# Patient Record
Sex: Male | Born: 1935
Health system: Southern US, Community
[De-identification: ages and names within clinical notes are randomized; demographics above are authoritative.]

## PROBLEM LIST (undated history)

## (undated) DIAGNOSIS — I2510 Atherosclerotic heart disease of native coronary artery without angina pectoris: Secondary | ICD-10-CM

## (undated) DIAGNOSIS — C61 Malignant neoplasm of prostate: Secondary | ICD-10-CM

## (undated) DIAGNOSIS — I219 Acute myocardial infarction, unspecified: Secondary | ICD-10-CM

## (undated) DIAGNOSIS — I951 Orthostatic hypotension: Principal | ICD-10-CM

## (undated) DIAGNOSIS — I639 Cerebral infarction, unspecified: Secondary | ICD-10-CM

## (undated) HISTORY — DX: Cerebral infarction, unspecified: I63.9

## (undated) HISTORY — PX: OTHER SURGICAL HISTORY: SHX169

## (undated) HISTORY — DX: Orthostatic hypotension: I95.1

## (undated) HISTORY — PX: CORONARY ARTERY BYPASS GRAFT: SHX141

## (undated) HISTORY — PX: CARDIAC SURGERY: SHX584

---

## 2014-08-22 DIAGNOSIS — M62838 Other muscle spasm: Secondary | ICD-10-CM | POA: Diagnosis not present

## 2014-08-22 DIAGNOSIS — M533 Sacrococcygeal disorders, not elsewhere classified: Secondary | ICD-10-CM | POA: Diagnosis not present

## 2014-08-31 DIAGNOSIS — E785 Hyperlipidemia, unspecified: Secondary | ICD-10-CM | POA: Diagnosis not present

## 2014-08-31 DIAGNOSIS — C61 Malignant neoplasm of prostate: Secondary | ICD-10-CM | POA: Diagnosis not present

## 2014-08-31 DIAGNOSIS — M5441 Lumbago with sciatica, right side: Secondary | ICD-10-CM | POA: Diagnosis not present

## 2014-09-07 DIAGNOSIS — M545 Low back pain: Secondary | ICD-10-CM | POA: Diagnosis not present

## 2014-09-09 DIAGNOSIS — C61 Malignant neoplasm of prostate: Secondary | ICD-10-CM | POA: Diagnosis not present

## 2014-09-15 DIAGNOSIS — M4806 Spinal stenosis, lumbar region: Secondary | ICD-10-CM | POA: Diagnosis not present

## 2014-09-15 DIAGNOSIS — M5126 Other intervertebral disc displacement, lumbar region: Secondary | ICD-10-CM | POA: Diagnosis not present

## 2014-09-15 DIAGNOSIS — M5136 Other intervertebral disc degeneration, lumbar region: Secondary | ICD-10-CM | POA: Diagnosis not present

## 2014-09-15 DIAGNOSIS — M47816 Spondylosis without myelopathy or radiculopathy, lumbar region: Secondary | ICD-10-CM | POA: Diagnosis not present

## 2014-09-27 DIAGNOSIS — J329 Chronic sinusitis, unspecified: Secondary | ICD-10-CM | POA: Diagnosis not present

## 2014-09-27 DIAGNOSIS — B9689 Other specified bacterial agents as the cause of diseases classified elsewhere: Secondary | ICD-10-CM | POA: Diagnosis not present

## 2014-09-29 DIAGNOSIS — H04123 Dry eye syndrome of bilateral lacrimal glands: Secondary | ICD-10-CM | POA: Diagnosis not present

## 2014-09-29 DIAGNOSIS — Z961 Presence of intraocular lens: Secondary | ICD-10-CM | POA: Diagnosis not present

## 2014-09-29 DIAGNOSIS — H4011X3 Primary open-angle glaucoma, severe stage: Secondary | ICD-10-CM | POA: Diagnosis not present

## 2014-09-29 DIAGNOSIS — H43393 Other vitreous opacities, bilateral: Secondary | ICD-10-CM | POA: Diagnosis not present

## 2014-09-29 DIAGNOSIS — H02052 Trichiasis without entropian right lower eyelid: Secondary | ICD-10-CM | POA: Diagnosis not present

## 2014-10-13 DIAGNOSIS — D2339 Other benign neoplasm of skin of other parts of face: Secondary | ICD-10-CM | POA: Diagnosis not present

## 2014-10-13 DIAGNOSIS — L570 Actinic keratosis: Secondary | ICD-10-CM | POA: Diagnosis not present

## 2014-10-13 DIAGNOSIS — I780 Hereditary hemorrhagic telangiectasia: Secondary | ICD-10-CM | POA: Diagnosis not present

## 2014-10-13 DIAGNOSIS — L818 Other specified disorders of pigmentation: Secondary | ICD-10-CM | POA: Diagnosis not present

## 2014-10-31 DIAGNOSIS — M545 Low back pain: Secondary | ICD-10-CM | POA: Diagnosis not present

## 2014-11-08 DIAGNOSIS — M5413 Radiculopathy, cervicothoracic region: Secondary | ICD-10-CM | POA: Diagnosis not present

## 2014-11-08 DIAGNOSIS — M9901 Segmental and somatic dysfunction of cervical region: Secondary | ICD-10-CM | POA: Diagnosis not present

## 2014-11-08 DIAGNOSIS — M5030 Other cervical disc degeneration, unspecified cervical region: Secondary | ICD-10-CM | POA: Diagnosis not present

## 2014-11-08 DIAGNOSIS — M47813 Spondylosis without myelopathy or radiculopathy, cervicothoracic region: Secondary | ICD-10-CM | POA: Diagnosis not present

## 2014-11-09 DIAGNOSIS — M9901 Segmental and somatic dysfunction of cervical region: Secondary | ICD-10-CM | POA: Diagnosis not present

## 2014-11-09 DIAGNOSIS — M5030 Other cervical disc degeneration, unspecified cervical region: Secondary | ICD-10-CM | POA: Diagnosis not present

## 2014-11-09 DIAGNOSIS — M47813 Spondylosis without myelopathy or radiculopathy, cervicothoracic region: Secondary | ICD-10-CM | POA: Diagnosis not present

## 2014-11-09 DIAGNOSIS — M5413 Radiculopathy, cervicothoracic region: Secondary | ICD-10-CM | POA: Diagnosis not present

## 2014-11-10 DIAGNOSIS — M47813 Spondylosis without myelopathy or radiculopathy, cervicothoracic region: Secondary | ICD-10-CM | POA: Diagnosis not present

## 2014-11-10 DIAGNOSIS — M5030 Other cervical disc degeneration, unspecified cervical region: Secondary | ICD-10-CM | POA: Diagnosis not present

## 2014-11-10 DIAGNOSIS — M5413 Radiculopathy, cervicothoracic region: Secondary | ICD-10-CM | POA: Diagnosis not present

## 2014-11-10 DIAGNOSIS — M9901 Segmental and somatic dysfunction of cervical region: Secondary | ICD-10-CM | POA: Diagnosis not present

## 2014-11-11 DIAGNOSIS — M9901 Segmental and somatic dysfunction of cervical region: Secondary | ICD-10-CM | POA: Diagnosis not present

## 2014-11-11 DIAGNOSIS — M5030 Other cervical disc degeneration, unspecified cervical region: Secondary | ICD-10-CM | POA: Diagnosis not present

## 2014-11-11 DIAGNOSIS — M5413 Radiculopathy, cervicothoracic region: Secondary | ICD-10-CM | POA: Diagnosis not present

## 2014-11-11 DIAGNOSIS — M47813 Spondylosis without myelopathy or radiculopathy, cervicothoracic region: Secondary | ICD-10-CM | POA: Diagnosis not present

## 2014-11-14 DIAGNOSIS — M47813 Spondylosis without myelopathy or radiculopathy, cervicothoracic region: Secondary | ICD-10-CM | POA: Diagnosis not present

## 2014-11-14 DIAGNOSIS — M5030 Other cervical disc degeneration, unspecified cervical region: Secondary | ICD-10-CM | POA: Diagnosis not present

## 2014-11-14 DIAGNOSIS — M9901 Segmental and somatic dysfunction of cervical region: Secondary | ICD-10-CM | POA: Diagnosis not present

## 2014-11-14 DIAGNOSIS — M5413 Radiculopathy, cervicothoracic region: Secondary | ICD-10-CM | POA: Diagnosis not present

## 2014-11-18 DIAGNOSIS — M4806 Spinal stenosis, lumbar region: Secondary | ICD-10-CM | POA: Diagnosis not present

## 2014-11-21 DIAGNOSIS — M5030 Other cervical disc degeneration, unspecified cervical region: Secondary | ICD-10-CM | POA: Diagnosis not present

## 2014-11-21 DIAGNOSIS — E780 Pure hypercholesterolemia: Secondary | ICD-10-CM | POA: Diagnosis not present

## 2014-11-21 DIAGNOSIS — M5413 Radiculopathy, cervicothoracic region: Secondary | ICD-10-CM | POA: Diagnosis not present

## 2014-11-21 DIAGNOSIS — M47813 Spondylosis without myelopathy or radiculopathy, cervicothoracic region: Secondary | ICD-10-CM | POA: Diagnosis not present

## 2014-11-21 DIAGNOSIS — M9901 Segmental and somatic dysfunction of cervical region: Secondary | ICD-10-CM | POA: Diagnosis not present

## 2014-11-22 DIAGNOSIS — M5030 Other cervical disc degeneration, unspecified cervical region: Secondary | ICD-10-CM | POA: Diagnosis not present

## 2014-11-22 DIAGNOSIS — M5413 Radiculopathy, cervicothoracic region: Secondary | ICD-10-CM | POA: Diagnosis not present

## 2014-11-22 DIAGNOSIS — M9901 Segmental and somatic dysfunction of cervical region: Secondary | ICD-10-CM | POA: Diagnosis not present

## 2014-11-22 DIAGNOSIS — M47813 Spondylosis without myelopathy or radiculopathy, cervicothoracic region: Secondary | ICD-10-CM | POA: Diagnosis not present

## 2014-11-23 DIAGNOSIS — M5030 Other cervical disc degeneration, unspecified cervical region: Secondary | ICD-10-CM | POA: Diagnosis not present

## 2014-11-23 DIAGNOSIS — M9901 Segmental and somatic dysfunction of cervical region: Secondary | ICD-10-CM | POA: Diagnosis not present

## 2014-11-23 DIAGNOSIS — M5413 Radiculopathy, cervicothoracic region: Secondary | ICD-10-CM | POA: Diagnosis not present

## 2014-11-23 DIAGNOSIS — M47813 Spondylosis without myelopathy or radiculopathy, cervicothoracic region: Secondary | ICD-10-CM | POA: Diagnosis not present

## 2014-11-24 DIAGNOSIS — M5030 Other cervical disc degeneration, unspecified cervical region: Secondary | ICD-10-CM | POA: Diagnosis not present

## 2014-11-24 DIAGNOSIS — M47813 Spondylosis without myelopathy or radiculopathy, cervicothoracic region: Secondary | ICD-10-CM | POA: Diagnosis not present

## 2014-11-24 DIAGNOSIS — M5413 Radiculopathy, cervicothoracic region: Secondary | ICD-10-CM | POA: Diagnosis not present

## 2014-11-24 DIAGNOSIS — M9901 Segmental and somatic dysfunction of cervical region: Secondary | ICD-10-CM | POA: Diagnosis not present

## 2014-11-28 DIAGNOSIS — I2510 Atherosclerotic heart disease of native coronary artery without angina pectoris: Secondary | ICD-10-CM | POA: Diagnosis not present

## 2014-11-28 DIAGNOSIS — C61 Malignant neoplasm of prostate: Secondary | ICD-10-CM | POA: Diagnosis not present

## 2014-11-28 DIAGNOSIS — E780 Pure hypercholesterolemia: Secondary | ICD-10-CM | POA: Diagnosis not present

## 2014-12-15 DIAGNOSIS — M47812 Spondylosis without myelopathy or radiculopathy, cervical region: Secondary | ICD-10-CM | POA: Diagnosis not present

## 2014-12-15 DIAGNOSIS — M609 Myositis, unspecified: Secondary | ICD-10-CM | POA: Diagnosis not present

## 2014-12-17 DIAGNOSIS — S61210A Laceration without foreign body of right index finger without damage to nail, initial encounter: Secondary | ICD-10-CM | POA: Diagnosis not present

## 2014-12-17 DIAGNOSIS — W1830XA Fall on same level, unspecified, initial encounter: Secondary | ICD-10-CM | POA: Diagnosis not present

## 2014-12-17 DIAGNOSIS — S098XXA Other specified injuries of head, initial encounter: Secondary | ICD-10-CM | POA: Diagnosis not present

## 2014-12-17 DIAGNOSIS — S0990XA Unspecified injury of head, initial encounter: Secondary | ICD-10-CM | POA: Diagnosis not present

## 2014-12-17 DIAGNOSIS — S0101XA Laceration without foreign body of scalp, initial encounter: Secondary | ICD-10-CM | POA: Diagnosis not present

## 2014-12-17 DIAGNOSIS — Z23 Encounter for immunization: Secondary | ICD-10-CM | POA: Diagnosis not present

## 2014-12-19 DIAGNOSIS — M791 Myalgia: Secondary | ICD-10-CM | POA: Diagnosis not present

## 2014-12-19 DIAGNOSIS — M609 Myositis, unspecified: Secondary | ICD-10-CM | POA: Diagnosis not present

## 2014-12-20 DIAGNOSIS — C61 Malignant neoplasm of prostate: Secondary | ICD-10-CM | POA: Diagnosis not present

## 2014-12-20 DIAGNOSIS — I6529 Occlusion and stenosis of unspecified carotid artery: Secondary | ICD-10-CM | POA: Diagnosis not present

## 2014-12-20 DIAGNOSIS — I77819 Aortic ectasia, unspecified site: Secondary | ICD-10-CM | POA: Diagnosis not present

## 2014-12-29 DIAGNOSIS — H4011X3 Primary open-angle glaucoma, severe stage: Secondary | ICD-10-CM | POA: Diagnosis not present

## 2014-12-29 DIAGNOSIS — H04123 Dry eye syndrome of bilateral lacrimal glands: Secondary | ICD-10-CM | POA: Diagnosis not present

## 2014-12-29 DIAGNOSIS — Z961 Presence of intraocular lens: Secondary | ICD-10-CM | POA: Diagnosis not present

## 2015-01-10 DIAGNOSIS — H4011X3 Primary open-angle glaucoma, severe stage: Secondary | ICD-10-CM | POA: Diagnosis not present

## 2015-01-19 DIAGNOSIS — M542 Cervicalgia: Secondary | ICD-10-CM | POA: Diagnosis not present

## 2015-01-19 DIAGNOSIS — M791 Myalgia: Secondary | ICD-10-CM | POA: Diagnosis not present

## 2015-01-24 DIAGNOSIS — H4011X3 Primary open-angle glaucoma, severe stage: Secondary | ICD-10-CM | POA: Diagnosis not present

## 2015-02-16 DIAGNOSIS — M47812 Spondylosis without myelopathy or radiculopathy, cervical region: Secondary | ICD-10-CM | POA: Diagnosis not present

## 2015-02-16 DIAGNOSIS — M47892 Other spondylosis, cervical region: Secondary | ICD-10-CM | POA: Diagnosis not present

## 2015-02-16 DIAGNOSIS — M1288 Other specific arthropathies, not elsewhere classified, other specified site: Secondary | ICD-10-CM | POA: Diagnosis not present

## 2015-02-16 DIAGNOSIS — M542 Cervicalgia: Secondary | ICD-10-CM | POA: Diagnosis not present

## 2015-02-20 DIAGNOSIS — C61 Malignant neoplasm of prostate: Secondary | ICD-10-CM | POA: Diagnosis not present

## 2015-03-21 DIAGNOSIS — M47892 Other spondylosis, cervical region: Secondary | ICD-10-CM | POA: Diagnosis not present

## 2015-03-21 DIAGNOSIS — M542 Cervicalgia: Secondary | ICD-10-CM | POA: Diagnosis not present

## 2015-04-04 DIAGNOSIS — M542 Cervicalgia: Secondary | ICD-10-CM | POA: Diagnosis not present

## 2015-04-11 DIAGNOSIS — M542 Cervicalgia: Secondary | ICD-10-CM | POA: Diagnosis not present

## 2015-05-08 DIAGNOSIS — M47892 Other spondylosis, cervical region: Secondary | ICD-10-CM | POA: Diagnosis not present

## 2015-05-08 DIAGNOSIS — M47812 Spondylosis without myelopathy or radiculopathy, cervical region: Secondary | ICD-10-CM | POA: Diagnosis not present

## 2015-05-08 DIAGNOSIS — M542 Cervicalgia: Secondary | ICD-10-CM | POA: Diagnosis not present

## 2015-05-09 DIAGNOSIS — Z85828 Personal history of other malignant neoplasm of skin: Secondary | ICD-10-CM | POA: Diagnosis not present

## 2015-05-09 DIAGNOSIS — C4481 Basal cell carcinoma of overlapping sites of skin: Secondary | ICD-10-CM | POA: Diagnosis not present

## 2015-05-09 DIAGNOSIS — D1801 Hemangioma of skin and subcutaneous tissue: Secondary | ICD-10-CM | POA: Diagnosis not present

## 2015-05-09 DIAGNOSIS — L821 Other seborrheic keratosis: Secondary | ICD-10-CM | POA: Diagnosis not present

## 2015-05-09 DIAGNOSIS — D485 Neoplasm of uncertain behavior of skin: Secondary | ICD-10-CM | POA: Diagnosis not present

## 2015-05-09 DIAGNOSIS — L723 Sebaceous cyst: Secondary | ICD-10-CM | POA: Diagnosis not present

## 2015-05-09 DIAGNOSIS — L448 Other specified papulosquamous disorders: Secondary | ICD-10-CM | POA: Diagnosis not present

## 2015-05-09 DIAGNOSIS — D235 Other benign neoplasm of skin of trunk: Secondary | ICD-10-CM | POA: Diagnosis not present

## 2015-05-11 DIAGNOSIS — C61 Malignant neoplasm of prostate: Secondary | ICD-10-CM | POA: Diagnosis not present

## 2015-05-16 DIAGNOSIS — C44329 Squamous cell carcinoma of skin of other parts of face: Secondary | ICD-10-CM | POA: Diagnosis not present

## 2015-05-22 DIAGNOSIS — H401133 Primary open-angle glaucoma, bilateral, severe stage: Secondary | ICD-10-CM | POA: Diagnosis not present

## 2015-05-22 DIAGNOSIS — H04123 Dry eye syndrome of bilateral lacrimal glands: Secondary | ICD-10-CM | POA: Diagnosis not present

## 2015-05-22 DIAGNOSIS — H02135 Senile ectropion of left lower eyelid: Secondary | ICD-10-CM | POA: Diagnosis not present

## 2015-05-22 DIAGNOSIS — Z961 Presence of intraocular lens: Secondary | ICD-10-CM | POA: Diagnosis not present

## 2015-05-24 DIAGNOSIS — C44320 Squamous cell carcinoma of skin of unspecified parts of face: Secondary | ICD-10-CM | POA: Diagnosis not present

## 2015-05-25 DIAGNOSIS — C61 Malignant neoplasm of prostate: Secondary | ICD-10-CM | POA: Diagnosis not present

## 2015-05-25 DIAGNOSIS — R972 Elevated prostate specific antigen [PSA]: Secondary | ICD-10-CM | POA: Diagnosis not present

## 2015-05-28 DIAGNOSIS — Z23 Encounter for immunization: Secondary | ICD-10-CM | POA: Diagnosis not present

## 2015-05-30 DIAGNOSIS — A499 Bacterial infection, unspecified: Secondary | ICD-10-CM | POA: Diagnosis not present

## 2015-06-06 DIAGNOSIS — C4491 Basal cell carcinoma of skin, unspecified: Secondary | ICD-10-CM | POA: Diagnosis not present

## 2015-06-06 DIAGNOSIS — C44519 Basal cell carcinoma of skin of other part of trunk: Secondary | ICD-10-CM | POA: Diagnosis not present

## 2015-07-17 DIAGNOSIS — I10 Essential (primary) hypertension: Secondary | ICD-10-CM | POA: Diagnosis not present

## 2015-07-17 DIAGNOSIS — R079 Chest pain, unspecified: Secondary | ICD-10-CM | POA: Diagnosis not present

## 2015-07-17 DIAGNOSIS — Z789 Other specified health status: Secondary | ICD-10-CM | POA: Diagnosis not present

## 2015-07-24 DIAGNOSIS — Z0000 Encounter for general adult medical examination without abnormal findings: Secondary | ICD-10-CM | POA: Diagnosis not present

## 2015-07-24 DIAGNOSIS — I10 Essential (primary) hypertension: Secondary | ICD-10-CM | POA: Diagnosis not present

## 2015-07-24 DIAGNOSIS — E7800 Pure hypercholesterolemia, unspecified: Secondary | ICD-10-CM | POA: Diagnosis not present

## 2015-07-24 DIAGNOSIS — I2510 Atherosclerotic heart disease of native coronary artery without angina pectoris: Secondary | ICD-10-CM | POA: Diagnosis not present

## 2015-08-04 DIAGNOSIS — G5602 Carpal tunnel syndrome, left upper limb: Secondary | ICD-10-CM | POA: Diagnosis not present

## 2015-08-08 DIAGNOSIS — R972 Elevated prostate specific antigen [PSA]: Secondary | ICD-10-CM | POA: Diagnosis not present

## 2015-08-17 DIAGNOSIS — C61 Malignant neoplasm of prostate: Secondary | ICD-10-CM | POA: Diagnosis not present

## 2015-08-17 DIAGNOSIS — G5602 Carpal tunnel syndrome, left upper limb: Secondary | ICD-10-CM | POA: Diagnosis not present

## 2015-08-18 DIAGNOSIS — S41119A Laceration without foreign body of unspecified upper arm, initial encounter: Secondary | ICD-10-CM | POA: Diagnosis not present

## 2015-08-18 DIAGNOSIS — G5600 Carpal tunnel syndrome, unspecified upper limb: Secondary | ICD-10-CM | POA: Diagnosis not present

## 2015-08-21 DIAGNOSIS — G5602 Carpal tunnel syndrome, left upper limb: Secondary | ICD-10-CM | POA: Diagnosis not present

## 2015-09-01 DIAGNOSIS — G5602 Carpal tunnel syndrome, left upper limb: Secondary | ICD-10-CM | POA: Diagnosis not present

## 2015-10-04 DIAGNOSIS — H04123 Dry eye syndrome of bilateral lacrimal glands: Secondary | ICD-10-CM | POA: Diagnosis not present

## 2015-10-04 DIAGNOSIS — H401122 Primary open-angle glaucoma, left eye, moderate stage: Secondary | ICD-10-CM | POA: Diagnosis not present

## 2015-10-04 DIAGNOSIS — H401111 Primary open-angle glaucoma, right eye, mild stage: Secondary | ICD-10-CM | POA: Diagnosis not present

## 2015-10-04 DIAGNOSIS — Z961 Presence of intraocular lens: Secondary | ICD-10-CM | POA: Diagnosis not present

## 2015-10-04 DIAGNOSIS — H02054 Trichiasis without entropian left upper eyelid: Secondary | ICD-10-CM | POA: Diagnosis not present

## 2015-10-10 DIAGNOSIS — D1801 Hemangioma of skin and subcutaneous tissue: Secondary | ICD-10-CM | POA: Diagnosis not present

## 2015-10-10 DIAGNOSIS — L570 Actinic keratosis: Secondary | ICD-10-CM | POA: Diagnosis not present

## 2015-10-10 DIAGNOSIS — L821 Other seborrheic keratosis: Secondary | ICD-10-CM | POA: Diagnosis not present

## 2015-10-10 DIAGNOSIS — D235 Other benign neoplasm of skin of trunk: Secondary | ICD-10-CM | POA: Diagnosis not present

## 2015-10-24 DIAGNOSIS — R079 Chest pain, unspecified: Secondary | ICD-10-CM | POA: Diagnosis not present

## 2015-11-13 DIAGNOSIS — R079 Chest pain, unspecified: Secondary | ICD-10-CM | POA: Diagnosis not present

## 2015-11-13 DIAGNOSIS — Z882 Allergy status to sulfonamides status: Secondary | ICD-10-CM | POA: Diagnosis not present

## 2015-11-13 DIAGNOSIS — I2582 Chronic total occlusion of coronary artery: Secondary | ICD-10-CM | POA: Diagnosis present

## 2015-11-13 DIAGNOSIS — I252 Old myocardial infarction: Secondary | ICD-10-CM | POA: Diagnosis not present

## 2015-11-13 DIAGNOSIS — I2584 Coronary atherosclerosis due to calcified coronary lesion: Secondary | ICD-10-CM | POA: Diagnosis present

## 2015-11-13 DIAGNOSIS — Z955 Presence of coronary angioplasty implant and graft: Secondary | ICD-10-CM | POA: Diagnosis not present

## 2015-11-13 DIAGNOSIS — I25710 Atherosclerosis of autologous vein coronary artery bypass graft(s) with unstable angina pectoris: Secondary | ICD-10-CM | POA: Diagnosis not present

## 2015-11-13 DIAGNOSIS — Z7982 Long term (current) use of aspirin: Secondary | ICD-10-CM | POA: Diagnosis not present

## 2015-11-13 DIAGNOSIS — Z7902 Long term (current) use of antithrombotics/antiplatelets: Secondary | ICD-10-CM | POA: Diagnosis not present

## 2015-11-13 DIAGNOSIS — Z87891 Personal history of nicotine dependence: Secondary | ICD-10-CM | POA: Diagnosis not present

## 2015-11-29 DIAGNOSIS — I10 Essential (primary) hypertension: Secondary | ICD-10-CM | POA: Diagnosis not present

## 2015-11-29 DIAGNOSIS — R079 Chest pain, unspecified: Secondary | ICD-10-CM | POA: Diagnosis not present

## 2015-11-29 DIAGNOSIS — I2510 Atherosclerotic heart disease of native coronary artery without angina pectoris: Secondary | ICD-10-CM | POA: Diagnosis not present

## 2016-01-03 DIAGNOSIS — Z961 Presence of intraocular lens: Secondary | ICD-10-CM | POA: Diagnosis not present

## 2016-01-03 DIAGNOSIS — H401132 Primary open-angle glaucoma, bilateral, moderate stage: Secondary | ICD-10-CM | POA: Diagnosis not present

## 2016-01-15 DIAGNOSIS — I6529 Occlusion and stenosis of unspecified carotid artery: Secondary | ICD-10-CM | POA: Diagnosis not present

## 2016-01-15 DIAGNOSIS — E7800 Pure hypercholesterolemia, unspecified: Secondary | ICD-10-CM | POA: Diagnosis not present

## 2016-01-15 DIAGNOSIS — I10 Essential (primary) hypertension: Secondary | ICD-10-CM | POA: Diagnosis not present

## 2016-01-22 DIAGNOSIS — E871 Hypo-osmolality and hyponatremia: Secondary | ICD-10-CM | POA: Diagnosis not present

## 2016-01-22 DIAGNOSIS — I2510 Atherosclerotic heart disease of native coronary artery without angina pectoris: Secondary | ICD-10-CM | POA: Diagnosis not present

## 2016-01-22 DIAGNOSIS — E7800 Pure hypercholesterolemia, unspecified: Secondary | ICD-10-CM | POA: Diagnosis not present

## 2016-01-30 DIAGNOSIS — C61 Malignant neoplasm of prostate: Secondary | ICD-10-CM | POA: Diagnosis not present

## 2016-02-19 DIAGNOSIS — M25551 Pain in right hip: Secondary | ICD-10-CM | POA: Diagnosis not present

## 2016-02-21 DIAGNOSIS — N401 Enlarged prostate with lower urinary tract symptoms: Secondary | ICD-10-CM | POA: Diagnosis not present

## 2016-02-21 DIAGNOSIS — C61 Malignant neoplasm of prostate: Secondary | ICD-10-CM | POA: Diagnosis not present

## 2016-03-25 DIAGNOSIS — H47021 Hemorrhage in optic nerve sheath, right eye: Secondary | ICD-10-CM | POA: Diagnosis not present

## 2016-03-25 DIAGNOSIS — H3581 Retinal edema: Secondary | ICD-10-CM | POA: Diagnosis not present

## 2016-03-25 DIAGNOSIS — H401132 Primary open-angle glaucoma, bilateral, moderate stage: Secondary | ICD-10-CM | POA: Diagnosis not present

## 2016-03-25 DIAGNOSIS — Z961 Presence of intraocular lens: Secondary | ICD-10-CM | POA: Diagnosis not present

## 2016-04-23 DIAGNOSIS — Z23 Encounter for immunization: Secondary | ICD-10-CM | POA: Diagnosis not present

## 2016-04-25 DIAGNOSIS — H401132 Primary open-angle glaucoma, bilateral, moderate stage: Secondary | ICD-10-CM | POA: Diagnosis not present

## 2016-04-25 DIAGNOSIS — Z961 Presence of intraocular lens: Secondary | ICD-10-CM | POA: Diagnosis not present

## 2016-06-03 DIAGNOSIS — R079 Chest pain, unspecified: Secondary | ICD-10-CM | POA: Diagnosis not present

## 2016-06-03 DIAGNOSIS — I2510 Atherosclerotic heart disease of native coronary artery without angina pectoris: Secondary | ICD-10-CM | POA: Diagnosis not present

## 2016-06-03 DIAGNOSIS — R2681 Unsteadiness on feet: Secondary | ICD-10-CM | POA: Diagnosis not present

## 2016-06-03 DIAGNOSIS — I10 Essential (primary) hypertension: Secondary | ICD-10-CM | POA: Diagnosis not present

## 2016-07-25 DIAGNOSIS — H401132 Primary open-angle glaucoma, bilateral, moderate stage: Secondary | ICD-10-CM | POA: Diagnosis not present

## 2016-07-25 DIAGNOSIS — Z961 Presence of intraocular lens: Secondary | ICD-10-CM | POA: Diagnosis not present

## 2016-08-12 DIAGNOSIS — I951 Orthostatic hypotension: Secondary | ICD-10-CM

## 2016-08-12 HISTORY — DX: Orthostatic hypotension: I95.1

## 2016-08-22 ENCOUNTER — Emergency Department (INDEPENDENT_AMBULATORY_CARE_PROVIDER_SITE_OTHER)
Admission: EM | Admit: 2016-08-22 | Discharge: 2016-08-22 | Disposition: A | Discharge status: Home / Self Care | Payer: Medicare Other | Source: Home / Self Care | Attending: Family Medicine | Admitting: Family Medicine

## 2016-08-22 ENCOUNTER — Encounter: Payer: Self-pay | Admitting: Emergency Medicine

## 2016-08-22 DIAGNOSIS — J40 Bronchitis, not specified as acute or chronic: Secondary | ICD-10-CM

## 2016-08-22 MED ORDER — AZITHROMYCIN 250 MG PO TABS: ORAL_TABLET | ORAL | 0 refills | Status: DC

## 2016-08-22 NOTE — ED Provider Notes (Signed)
[name]    CSN: [id number] Arrival date & time: 08/22/16  1309     History   Chief Complaint Chief Complaint  Patient presents with  . Cough    HPI Thomas Hanna is a 83 y.o. male.   Patient developed a non-productive cough about 6 days ago without sore throat or sinus congestion.  Three days ago he developed diarrhea, now improved.  No nausea/vomiting.  He has had fatigue, chills, and sweats.  He often coughs until he gags.   The history is provided by the patient.    History reviewed. No pertinent past medical history.  There are no active problems to display for this patient.   Past Surgical History:  Procedure Laterality Date  . CARDIAC SURGERY         Home Medications    Prior to Admission medications   Medication Sig Start Date End Date Taking? Authorizing Provider  aspirin EC 81 MG tablet Take 81 mg by mouth daily.   Yes Historical Provider, MD  cholecalciferol (VITAMIN D) 1000 units tablet Take 1,000 Units by mouth daily.   Yes Historical Provider, MD  clopidogrel (PLAVIX) 300 MG TABS tablet Take 300 mg by mouth once.   Yes Historical Provider, MD  gabapentin (NEURONTIN) 100 MG capsule Take 100 mg by mouth 3 (three) times daily.   Yes Historical Provider, MD  latanoprost (XALATAN) 0.005 % ophthalmic solution 1 drop at bedtime.   Yes Historical Provider, MD  tamsulosin (FLOMAX) 0.4 MG CAPS capsule Take 0.4 mg by mouth.   Yes Historical Provider, MD  vitamin B-12 (CYANOCOBALAMIN) 100 MCG tablet Take 100 mcg by mouth daily.   Yes Historical Provider, MD  azithromycin (ZITHROMAX Z-PAK) 250 MG tablet Take 2 tabs today; then begin one tab once daily for 4 more days. 08/22/16   Kandra Nicolas, MD    Family History History reviewed. No pertinent family history.  Social History Social History  Substance Use Topics  . Smoking status: Never Smoker  . Smokeless tobacco: Never Used  . Alcohol use No     Allergies   Patient has no known  allergies.   Review of Systems Review of Systems No sore throat + cough No pleuritic pain No wheezing No nasal congestion No post-nasal drainage No sinus pain/pressure No itchy/red eyes No earache No hemoptysis No SOB No fever, + chills/sweats No nausea No vomiting No abdominal pain + diarrhea No urinary symptoms No skin rash + fatigue No myalgias + headache Used OTC meds without relief   Physical Exam Triage Vital Signs ED Triage Vitals  Enc Vitals Group     BP 08/22/16 1347 128/72     Pulse Rate 08/22/16 1347 78     Resp --      Temp 08/22/16 1347 98.5 F (36.9 C)     Temp Source 08/22/16 1347 Oral     SpO2 08/22/16 1347 98 %     Weight 08/22/16 1348 175 lb (79.4 kg)     Height 08/22/16 1348 5\' 11"  (1.803 m)     Head Circumference --      Peak Flow --      Pain Score 08/22/16 1355 0     Pain Loc --      Pain Edu? --      Excl. in [place]? --    No data found.   Updated Vital Signs BP 128/72 (BP Location: Left Arm)   Pulse 78   Temp 98.5 F (36.9 C) (  Oral)   Ht 5\' 11"  (1.803 m)   Wt 175 lb (79.4 kg)   SpO2 98%   BMI 24.41 kg/m   Visual Acuity Right Eye Distance:   Left Eye Distance:   Bilateral Distance:    Right Eye Near:   Left Eye Near:    Bilateral Near:     Physical Exam Nursing notes and Vital Signs reviewed. Appearance:  Patient appears stated age, and in no acute distress Eyes:  Pupils are equal, round, and reactive to light and accomodation.  Extraocular movement is intact.  Conjunctivae are not inflamed  Ears:  Canals normal.  Tympanic membranes normal.  Nose:  Mildly congested turbinates.  No sinus tenderness.   Pharynx:  Normal Neck:  Supple.  Tender enlarged posterior/lateral nodes are palpated bilaterally  Lungs:  Clear to auscultation.  Breath sounds are equal.  Moving air well. Heart:  Regular rate and rhythm without murmurs, rubs, or gallops.  Abdomen:  Nontender without masses or hepatosplenomegaly.  Bowel sounds are  present.  No CVA or flank tenderness.  Extremities:  No edema.  Skin:  No rash present.    UC Treatments / Results  Labs (all labs ordered are listed, but only abnormal results are displayed) Labs Reviewed - No data to display  EKG  EKG Interpretation None       Radiology No results found.  Procedures Procedures (including critical care time)  Medications Ordered in UC Medications - No data to display   Initial Impression / Assessment and Plan / UC Course  I have reviewed the triage vital signs and the nursing notes.  Pertinent labs & imaging results that were available during my care of the patient were reviewed by me and considered in my medical decision making (see chart for details).  Clinical Course   With history of GI complaints, non-productive cough and lack of other typical viral URI symptoms, concern for atypical organism such as pertussis. Begin Z-pak. Take plain guaifenesin (1200mg  extended release tabs such as Mucinex) twice daily, with plenty of water, for cough and congestion.  Get adequate rest.   Stop all antihistamines for now, and other non-prescription cough/cold preparations. May take Delsym Cough Suppressant at bedtime for nighttime cough.  If symptoms become significantly worse during the night or over the weekend, proceed to the local emergency room.  Followup with Family Doctor as scheduled on 09/02/16.     Final Clinical Impressions(s) / UC Diagnoses   Final diagnoses:  Bronchitis    New Prescriptions New Prescriptions   AZITHROMYCIN (ZITHROMAX Z-PAK) 250 MG TABLET    Take 2 tabs today; then begin one tab once daily for 4 more days.     Kandra Nicolas, MD 09/06/16 2133

## 2016-08-22 NOTE — Discharge Instructions (Signed)
Take plain guaifenesin (1200mg  extended release tabs such as Mucinex) twice daily, with plenty of water, for cough and congestion.  Get adequate rest.   Stop all antihistamines for now, and other non-prescription cough/cold preparations. May take Delsym Cough Suppressant at bedtime for nighttime cough.  If symptoms become significantly worse during the night or over the weekend, proceed to the local emergency room.

## 2016-08-22 NOTE — ED Triage Notes (Signed)
Cough x 1 week, diarrhea, chills x 2 days

## 2016-08-28 ENCOUNTER — Encounter (HOSPITAL_COMMUNITY): Payer: Self-pay | Admitting: Emergency Medicine

## 2016-08-28 ENCOUNTER — Observation Stay (HOSPITAL_COMMUNITY)
Admission: EM | Admit: 2016-08-28 | Discharge: 2016-08-29 | Disposition: A | Discharge status: Home / Self Care | Payer: Medicare Other | Attending: Internal Medicine | Admitting: Internal Medicine

## 2016-08-28 ENCOUNTER — Emergency Department (HOSPITAL_COMMUNITY): Payer: Medicare Other

## 2016-08-28 DIAGNOSIS — H409 Unspecified glaucoma: Secondary | ICD-10-CM | POA: Insufficient documentation

## 2016-08-28 DIAGNOSIS — R7989 Other specified abnormal findings of blood chemistry: Secondary | ICD-10-CM

## 2016-08-28 DIAGNOSIS — Z951 Presence of aortocoronary bypass graft: Secondary | ICD-10-CM | POA: Insufficient documentation

## 2016-08-28 DIAGNOSIS — E871 Hypo-osmolality and hyponatremia: Secondary | ICD-10-CM | POA: Diagnosis not present

## 2016-08-28 DIAGNOSIS — Z8049 Family history of malignant neoplasm of other genital organs: Secondary | ICD-10-CM | POA: Diagnosis not present

## 2016-08-28 DIAGNOSIS — R531 Weakness: Secondary | ICD-10-CM | POA: Diagnosis not present

## 2016-08-28 DIAGNOSIS — R404 Transient alteration of awareness: Secondary | ICD-10-CM | POA: Diagnosis not present

## 2016-08-28 DIAGNOSIS — Z8546 Personal history of malignant neoplasm of prostate: Secondary | ICD-10-CM | POA: Diagnosis not present

## 2016-08-28 DIAGNOSIS — E875 Hyperkalemia: Secondary | ICD-10-CM | POA: Diagnosis not present

## 2016-08-28 DIAGNOSIS — I2510 Atherosclerotic heart disease of native coronary artery without angina pectoris: Secondary | ICD-10-CM | POA: Insufficient documentation

## 2016-08-28 DIAGNOSIS — R4182 Altered mental status, unspecified: Secondary | ICD-10-CM | POA: Diagnosis not present

## 2016-08-28 DIAGNOSIS — Z833 Family history of diabetes mellitus: Secondary | ICD-10-CM | POA: Insufficient documentation

## 2016-08-28 DIAGNOSIS — Z87891 Personal history of nicotine dependence: Secondary | ICD-10-CM | POA: Insufficient documentation

## 2016-08-28 DIAGNOSIS — I700 Atherosclerosis of aorta: Secondary | ICD-10-CM | POA: Diagnosis not present

## 2016-08-28 DIAGNOSIS — R945 Abnormal results of liver function studies: Secondary | ICD-10-CM

## 2016-08-28 DIAGNOSIS — Z7982 Long term (current) use of aspirin: Secondary | ICD-10-CM | POA: Insufficient documentation

## 2016-08-28 DIAGNOSIS — Z7902 Long term (current) use of antithrombotics/antiplatelets: Secondary | ICD-10-CM | POA: Diagnosis not present

## 2016-08-28 DIAGNOSIS — R079 Chest pain, unspecified: Secondary | ICD-10-CM | POA: Insufficient documentation

## 2016-08-28 DIAGNOSIS — D649 Anemia, unspecified: Secondary | ICD-10-CM | POA: Diagnosis present

## 2016-08-28 DIAGNOSIS — I252 Old myocardial infarction: Secondary | ICD-10-CM | POA: Diagnosis not present

## 2016-08-28 DIAGNOSIS — I517 Cardiomegaly: Secondary | ICD-10-CM | POA: Insufficient documentation

## 2016-08-28 DIAGNOSIS — R55 Syncope and collapse: Principal | ICD-10-CM | POA: Insufficient documentation

## 2016-08-28 DIAGNOSIS — Z955 Presence of coronary angioplasty implant and graft: Secondary | ICD-10-CM | POA: Diagnosis not present

## 2016-08-28 DIAGNOSIS — I2583 Coronary atherosclerosis due to lipid rich plaque: Secondary | ICD-10-CM | POA: Diagnosis not present

## 2016-08-28 HISTORY — DX: Acute myocardial infarction, unspecified: I21.9

## 2016-08-28 HISTORY — DX: Malignant neoplasm of prostate: C61

## 2016-08-28 HISTORY — DX: Atherosclerotic heart disease of native coronary artery without angina pectoris: I25.10

## 2016-08-28 LAB — COMPREHENSIVE METABOLIC PANEL
ALK PHOS: 59 U/L (ref 38–126)
ALT: 18 U/L (ref 17–63)
ANION GAP: 10 (ref 5–15)
AST: 43 U/L — ABNORMAL HIGH (ref 15–41)
Albumin: 3.8 g/dL (ref 3.5–5.0)
BILIRUBIN TOTAL: 1.5 mg/dL — AB (ref 0.3–1.2)
BUN: 24 mg/dL — ABNORMAL HIGH (ref 6–20)
CALCIUM: 9.1 mg/dL (ref 8.9–10.3)
CO2: 23 mmol/L (ref 22–32)
Chloride: 100 mmol/L — ABNORMAL LOW (ref 101–111)
Creatinine, Ser: 0.73 mg/dL (ref 0.61–1.24)
GLUCOSE: 110 mg/dL — AB (ref 65–99)
POTASSIUM: 5.2 mmol/L — AB (ref 3.5–5.1)
Sodium: 133 mmol/L — ABNORMAL LOW (ref 135–145)
TOTAL PROTEIN: 6.5 g/dL (ref 6.5–8.1)

## 2016-08-28 LAB — URINALYSIS, ROUTINE W REFLEX MICROSCOPIC
Bilirubin Urine: NEGATIVE
GLUCOSE, UA: NEGATIVE mg/dL
Hgb urine dipstick: NEGATIVE
KETONES UR: NEGATIVE mg/dL
LEUKOCYTES UA: NEGATIVE
NITRITE: NEGATIVE
PH: 6 (ref 5.0–8.0)
PROTEIN: NEGATIVE mg/dL
Specific Gravity, Urine: 1.012 (ref 1.005–1.030)

## 2016-08-28 LAB — I-STAT TROPONIN, ED: Troponin i, poc: 0.01 ng/mL (ref 0.00–0.08)

## 2016-08-28 LAB — DIFFERENTIAL
Basophils Absolute: 0 10*3/uL (ref 0.0–0.1)
Basophils Relative: 0 %
EOS ABS: 0.2 10*3/uL (ref 0.0–0.7)
EOS PCT: 2 %
LYMPHS ABS: 1.7 10*3/uL (ref 0.7–4.0)
LYMPHS PCT: 16 %
MONO ABS: 1 10*3/uL (ref 0.1–1.0)
MONOS PCT: 10 %
NEUTROS PCT: 72 %
Neutro Abs: 7.5 10*3/uL (ref 1.7–7.7)

## 2016-08-28 LAB — CBC
HEMATOCRIT: 34.6 % — AB (ref 39.0–52.0)
HEMOGLOBIN: 11.4 g/dL — AB (ref 13.0–17.0)
MCH: 29.2 pg (ref 26.0–34.0)
MCHC: 32.9 g/dL (ref 30.0–36.0)
MCV: 88.5 fL (ref 78.0–100.0)
Platelets: 321 10*3/uL (ref 150–400)
RBC: 3.91 MIL/uL — AB (ref 4.22–5.81)
RDW: 14.4 % (ref 11.5–15.5)
WBC: 10.4 10*3/uL (ref 4.0–10.5)

## 2016-08-28 LAB — RAPID URINE DRUG SCREEN, HOSP PERFORMED
Amphetamines: NOT DETECTED
Barbiturates: NOT DETECTED
Benzodiazepines: NOT DETECTED
COCAINE: NOT DETECTED
OPIATES: NOT DETECTED
TETRAHYDROCANNABINOL: NOT DETECTED

## 2016-08-28 LAB — PROTIME-INR
INR: 0.95
Prothrombin Time: 12.7 seconds (ref 11.4–15.2)

## 2016-08-28 LAB — BASIC METABOLIC PANEL
Anion gap: 7 (ref 5–15)
BUN: 23 mg/dL — ABNORMAL HIGH (ref 6–20)
CALCIUM: 9.1 mg/dL (ref 8.9–10.3)
CO2: 26 mmol/L (ref 22–32)
CREATININE: 0.78 mg/dL (ref 0.61–1.24)
Chloride: 101 mmol/L (ref 101–111)
GFR calc non Af Amer: >60 mL/min (ref >60)
Glucose, Bld: 127 mg/dL — ABNORMAL HIGH (ref 65–99)
Potassium: 4.6 mmol/L (ref 3.5–5.1)
SODIUM: 134 mmol/L — AB (ref 135–145)

## 2016-08-28 LAB — I-STAT CHEM 8, ED
BUN: 32 mg/dL — ABNORMAL HIGH (ref 6–20)
CREATININE: 0.9 mg/dL (ref 0.61–1.24)
Calcium, Ion: 1.12 mmol/L — ABNORMAL LOW (ref 1.15–1.40)
Chloride: 99 mmol/L — ABNORMAL LOW (ref 101–111)
Glucose, Bld: 115 mg/dL — ABNORMAL HIGH (ref 65–99)
HEMATOCRIT: 35 % — AB (ref 39.0–52.0)
Hemoglobin: 11.9 g/dL — ABNORMAL LOW (ref 13.0–17.0)
Potassium: 5.9 mmol/L — ABNORMAL HIGH (ref 3.5–5.1)
Sodium: 133 mmol/L — ABNORMAL LOW (ref 135–145)
TCO2: 28 mmol/L (ref 0–100)

## 2016-08-28 LAB — APTT: aPTT: 30 seconds (ref 24–36)

## 2016-08-28 LAB — ETHANOL: Alcohol, Ethyl (B): <5 mg/dL (ref <5)

## 2016-08-28 NOTE — ED Notes (Signed)
Patient transported to CT 

## 2016-08-28 NOTE — ED Triage Notes (Signed)
Patient was at home, lives with son and daughter in law.  Patient had an episode of a blank stare, he was awake and had a pulse, not responding to family.  Patient was laid down on back, continued with the non responding.  [facility] was called, upon their arrival, patient was CAOx4 and answering questions appropriately.  He did have confusion upon arrival of 1st responders.  Patient has no complaints at this time.

## 2016-08-28 NOTE — ED Notes (Signed)
Dr Loleta Books in at bedside.

## 2016-08-28 NOTE — ED Notes (Signed)
Sent label to add on BMP

## 2016-08-28 NOTE — ED Provider Notes (Signed)
[facility] Provider Note   CSN: [id number] Arrival date & time: 08/28/16  2004     History   Chief Complaint Chief Complaint  Patient presents with  . Altered Mental Status   Triage note: Patient was at home, lives with son and daughter in law.  Patient had an episode of a blank stare, he was awake and had a pulse, not responding to family.  Patient was laid down on back, continued with the non responding.  [facility] was called, upon their arrival, patient was CAOx4 and answering questions appropriately.  He did have confusion upon arrival of 1st responders.  Patient has no complaints at this time.    HPI Kamry Stika is a 83 y.o. male.  The history is provided by the patient.  Altered Mental Status   This is a recurrent problem. The current episode started 1 to 2 hours ago. The problem has been resolved (spontaneously). Associated symptoms include confusion. Pertinent negatives include no seizures. His past medical history is significant for heart disease (MI s/p CABG). His past medical history does not include seizures, CVA, TIA, hypertension, psychotropic medication treatment or head trauma.   No modifying factors. No recent head trauma. Reports recent bronchitis, but has improved.  Patient son present at the bedside after my initial presentation and reported that he found the patient slumped over in his office chair. Upon trying to arouse the patient he had minimal success. More vigorous attempts were able to arouse the patient however the patient was significantly confused and going in and out of consciousness. During these episodes patient did complain of "not feeling well" while clutching his chest. Patient son reports that the patient complains of "not feeling well" whenever he has chest pains.   Past Medical History:  Diagnosis Date  . Coronary artery disease   . MI (myocardial infarction)     Patient Active Problem List   Diagnosis Date Noted  . Chest pain  08/28/2016    Past Surgical History:  Procedure Laterality Date  . cardiac stents    . CARDIAC SURGERY    . CORONARY ARTERY BYPASS GRAFT         Home Medications    Prior to Admission medications   Medication Sig Start Date End Date Taking? Authorizing Provider  aspirin EC 81 MG tablet Take 81 mg by mouth daily.   Yes Historical Provider, MD  cholecalciferol (VITAMIN D) 1000 units tablet Take 1,000 Units by mouth daily.   Yes Historical Provider, MD  clopidogrel (PLAVIX) 75 MG tablet Take 75 mg by mouth daily.   Yes Historical Provider, MD  dorzolamide (TRUSOPT) 2 % ophthalmic solution Place 1 drop into both eyes 2 (two) times daily.   Yes Historical Provider, MD  gabapentin (NEURONTIN) 100 MG capsule Take 600 mg by mouth 3 (three) times daily.    Yes Historical Provider, MD  latanoprost (XALATAN) 0.005 % ophthalmic solution Place 1 drop into both eyes at bedtime.    Yes Historical Provider, MD  POTASSIUM PO Take 1 tablet by mouth daily.   Yes Historical Provider, MD  tamsulosin (FLOMAX) 0.4 MG CAPS capsule Take 0.4 mg by mouth.   Yes Historical Provider, MD  vitamin B-12 (CYANOCOBALAMIN) 100 MCG tablet Take 100 mcg by mouth daily.   Yes Historical Provider, MD    Family History No family history on file.  Social History Social History  Substance Use Topics  . Smoking status: Never Smoker  . Smokeless tobacco: Never Used  . Alcohol  use No     Allergies   Patient has no known allergies.   Review of Systems Review of Systems  Constitutional: Negative for chills and fever.  HENT: Negative for ear pain and sore throat.   Eyes: Negative for pain and visual disturbance.  Respiratory: Negative for cough and shortness of breath.   Cardiovascular: Negative for chest pain and palpitations.  Gastrointestinal: Negative for abdominal pain and vomiting.  Genitourinary: Negative for dysuria and hematuria.  Musculoskeletal: Negative for arthralgias and back pain.  Skin: Negative  for color change and rash.  Neurological: Negative for seizures.  Psychiatric/Behavioral: Positive for confusion.  All other systems reviewed and are negative.     Physical Exam Updated Vital Signs BP 136/88 (BP Location: Right Arm)   Pulse 65   Temp 97.6 F (36.4 C) (Oral)   Resp 16   SpO2 99%   Physical Exam  Constitutional: He is oriented to person, place, and time. He appears well-developed and well-nourished. No distress.  HENT:  Head: Normocephalic and atraumatic.  Nose: Nose normal.  Eyes: Conjunctivae and EOM are normal. Pupils are equal, round, and reactive to light. Right eye exhibits no discharge. Left eye exhibits no discharge. No scleral icterus.  Neck: Normal range of motion. Neck supple.  Cardiovascular: Normal rate and regular rhythm.  Exam reveals no gallop and no friction rub.   No murmur heard. Pulmonary/Chest: Effort normal and breath sounds normal. No stridor. No respiratory distress. He has no rales.  Abdominal: Soft. He exhibits no distension. There is no tenderness.  Musculoskeletal: He exhibits no edema or tenderness.  Neurological: He is alert and oriented to person, place, and time.  Mental Status: Alert and oriented to person, place, and time. Attention and concentration normal. Speech clear. Recent memory is intac  Cranial Nerves  II Visual Fields: Intact to confrontation. Visual fields intact. III, IV, VI: Pupils equal and reactive to light and near. Full eye movement without nystagmus  V Facial Sensation: Normal. No weakness of masticatory muscles  VII: No facial weakness or asymmetry  VIII Auditory Acuity: Grossly normal  IX/X: The uvula is midline; the palate elevates symmetrically  XI: Normal sternocleidomastoid and trapezius strength  XII: The tongue is midline. No atrophy or fasciculations.   Motor System: Muscle Strength: 5/5 and symmetric in the upper and lower extremities. No pronation or drift.  Muscle Tone: Tone and muscle bulk are  normal in the upper and lower extremities.   Reflexes: DTRs: 2+ and symmetrical in all four extremities. Plantar responses are flexor bilaterally.  Coordination: Intact finger-to-nose, heel-to-shin, and rapid alternating movements. No tremor.  Sensation: Intact to light touch, and pinprick. Negative Romberg test.  Gait: deferred    Skin: Skin is warm and dry. No rash noted. He is not diaphoretic. No erythema.  Psychiatric: He has a normal mood and affect.  Vitals reviewed.    ED Treatments / Results  Labs (all labs ordered are listed, but only abnormal results are displayed) Labs Reviewed  CBC - Abnormal; Notable for the following:       Result Value   RBC 3.91 (*)    Hemoglobin 11.4 (*)    HCT 34.6 (*)    All other components within normal limits  COMPREHENSIVE METABOLIC PANEL - Abnormal; Notable for the following:    Sodium 133 (*)    Potassium 5.2 (*)    Chloride 100 (*)    Glucose, Bld 110 (*)    BUN 24 (*)  AST 43 (*)    Total Bilirubin 1.5 (*)    All other components within normal limits  BASIC METABOLIC PANEL - Abnormal; Notable for the following:    Sodium 134 (*)    Glucose, Bld 127 (*)    BUN 23 (*)    All other components within normal limits  I-STAT CHEM 8, ED - Abnormal; Notable for the following:    Sodium 133 (*)    Potassium 5.9 (*)    Chloride 99 (*)    BUN 32 (*)    Glucose, Bld 115 (*)    Calcium, Ion 1.12 (*)    Hemoglobin 11.9 (*)    HCT 35.0 (*)    All other components within normal limits  ETHANOL  PROTIME-INR  APTT  DIFFERENTIAL  URINALYSIS, ROUTINE W REFLEX MICROSCOPIC  RAPID URINE DRUG SCREEN, HOSP PERFORMED  I-STAT TROPOININ, ED    EKG  EKG Interpretation  Date/Time:  Wednesday August 28 2016 20:15:10 EST Ventricular Rate:  59 PR Interval:    QRS Duration: 107 QT Interval:  439 QTC Calculation: 435 R Axis:   63 Text Interpretation:  Sinus rhythm No old tracing to compare Confirmed by [place] [phone]) on 08/28/2016  9:58:39 PM       Radiology Dg Chest 2 View  Result Date: 08/28/2016 CLINICAL DATA:  Syncope. EXAM: CHEST  2 VIEW COMPARISON:  None. FINDINGS: Mild cardiomegaly. Atherosclerotic changes at the aortic arch. Patient is status post median sternotomy for presumed CABG. Median sternotomy wires appear intact and appropriately aligned. Lungs are hyperexpanded. Lungs are clear. No pleural effusion or pneumothorax seen. No acute or suspicious appearing osseous finding. IMPRESSION: 1. Mild cardiomegaly.  No evidence of CHF. 2. Hyperexpanded lungs suggesting COPD/emphysema. 3. Lungs are clear.  No evidence of pneumonia or pulmonary edema. 4. Aortic atherosclerosis. Electronically Signed   By: Franki Cabot M.D.   On: 08/28/2016 22:42   Ct Head Wo Contrast  Result Date: 08/28/2016 CLINICAL DATA:  Altered mental status. EXAM: CT HEAD WITHOUT CONTRAST TECHNIQUE: Contiguous axial images were obtained from the base of the skull through the vertex without intravenous contrast. COMPARISON:  None. FINDINGS: Brain: Mild generalized age related parenchymal atrophy with commensurate dilatation of the ventricles and sulci. Mild chronic small vessel ischemic changes within the periventricular white matter regions bilaterally. No mass, hemorrhage, edema or other evidence of acute parenchymal abnormality. No extra-axial hemorrhage. Vascular: There are chronic calcified atherosclerotic changes of the large vessels at the skull base. No unexpected hyperdense vessel. Skull: Normal. Negative for fracture or focal lesion. Sinuses/Orbits: No acute finding. Other: None. IMPRESSION: 1. No acute findings.  No intracranial mass, hemorrhage or edema. 2. Chronic microvascular ischemic changes in the white matter, mild in degree for age. Electronically Signed   By: Franki Cabot M.D.   On: 08/28/2016 20:58    Procedures Procedures (including critical care time)  Medications Ordered in ED Medications - No data to display   Initial  Impression / Assessment and Plan / ED Course  I have reviewed the triage vital signs and the nursing notes.  Pertinent labs & imaging results that were available during my care of the patient were reviewed by me and considered in my medical decision making (see chart for details).  Clinical Course     Etiology of patient's syncopal episode undetermined at this time. Altered mental status workup did not reveal source. CT head was negative. Chest x-ray without evidence of pneumonia. EKG without acute ischemic changes. Initial  troponin negative. Given the patient's significant cardiac past medical history we'll require admission for ACS rule out.   Appreciate hospitalist admission for continued workup.   Final Clinical Impressions(s) / ED Diagnoses   Final diagnoses:  Chest pain  Altered mental status, unspecified altered mental status type      Fatima Blank, MD 08/28/16 [phone]

## 2016-08-28 NOTE — ED Notes (Signed)
Patient taken to xray.

## 2016-08-28 NOTE — ED Notes (Signed)
Patient returned from CT

## 2016-08-29 ENCOUNTER — Encounter (HOSPITAL_COMMUNITY): Payer: Self-pay | Admitting: Family Medicine

## 2016-08-29 ENCOUNTER — Observation Stay (HOSPITAL_BASED_OUTPATIENT_CLINIC_OR_DEPARTMENT_OTHER): Payer: Medicare Other

## 2016-08-29 DIAGNOSIS — D649 Anemia, unspecified: Secondary | ICD-10-CM | POA: Diagnosis present

## 2016-08-29 DIAGNOSIS — E871 Hypo-osmolality and hyponatremia: Secondary | ICD-10-CM

## 2016-08-29 DIAGNOSIS — R55 Syncope and collapse: Secondary | ICD-10-CM

## 2016-08-29 DIAGNOSIS — R945 Abnormal results of liver function studies: Secondary | ICD-10-CM

## 2016-08-29 DIAGNOSIS — I2583 Coronary atherosclerosis due to lipid rich plaque: Secondary | ICD-10-CM

## 2016-08-29 DIAGNOSIS — I2510 Atherosclerotic heart disease of native coronary artery without angina pectoris: Secondary | ICD-10-CM | POA: Diagnosis present

## 2016-08-29 DIAGNOSIS — R7989 Other specified abnormal findings of blood chemistry: Secondary | ICD-10-CM | POA: Diagnosis present

## 2016-08-29 LAB — COMPREHENSIVE METABOLIC PANEL
ALK PHOS: 59 U/L (ref 38–126)
ALT: 18 U/L (ref 17–63)
ANION GAP: 7 (ref 5–15)
AST: 24 U/L (ref 15–41)
Albumin: 3.6 g/dL (ref 3.5–5.0)
BUN: 17 mg/dL (ref 6–20)
CALCIUM: 8.8 mg/dL — AB (ref 8.9–10.3)
CO2: 25 mmol/L (ref 22–32)
CREATININE: 0.66 mg/dL (ref 0.61–1.24)
Chloride: 105 mmol/L (ref 101–111)
Glucose, Bld: 104 mg/dL — ABNORMAL HIGH (ref 65–99)
Potassium: 4.3 mmol/L (ref 3.5–5.1)
SODIUM: 137 mmol/L (ref 135–145)
Total Bilirubin: 0.5 mg/dL (ref 0.3–1.2)
Total Protein: 6.6 g/dL (ref 6.5–8.1)

## 2016-08-29 LAB — CBC
HCT: 36.5 % — ABNORMAL LOW (ref 39.0–52.0)
Hemoglobin: 11.9 g/dL — ABNORMAL LOW (ref 13.0–17.0)
MCH: 29 pg (ref 26.0–34.0)
MCHC: 32.6 g/dL (ref 30.0–36.0)
MCV: 88.8 fL (ref 78.0–100.0)
PLATELETS: 304 10*3/uL (ref 150–400)
RBC: 4.11 MIL/uL — AB (ref 4.22–5.81)
RDW: 14.3 % (ref 11.5–15.5)
WBC: 9.1 10*3/uL (ref 4.0–10.5)

## 2016-08-29 LAB — SODIUM, URINE, RANDOM: Sodium, Ur: 51 mmol/L

## 2016-08-29 LAB — TROPONIN I: Troponin I: <0.03 ng/mL (ref <0.03)

## 2016-08-29 LAB — ECHOCARDIOGRAM COMPLETE
Height: 71 in
Weight: 2632 oz

## 2016-08-29 LAB — BILIRUBIN, FRACTIONATED(TOT/DIR/INDIR)
Bilirubin, Direct: <0.1 mg/dL — ABNORMAL LOW (ref 0.1–0.5)
Total Bilirubin: 0.4 mg/dL (ref 0.3–1.2)

## 2016-08-29 LAB — OSMOLALITY, URINE: Osmolality, Ur: 386 mOsm/kg (ref 300–900)

## 2016-08-29 LAB — OSMOLALITY: Osmolality: 288 mOsm/kg (ref 275–295)

## 2016-08-29 MED ORDER — ASPIRIN EC 81 MG PO TBEC
81.0000 mg | DELAYED_RELEASE_TABLET | Freq: Every day | ORAL | Status: DC
  Administered 2016-08-29: 81 mg via ORAL
  Filled 2016-08-29: qty 1

## 2016-08-29 MED ORDER — SODIUM CHLORIDE 0.9% FLUSH
3.0000 mL | Freq: Two times a day (BID) | INTRAVENOUS | Status: DC
  Administered 2016-08-29: 3 mL via INTRAVENOUS

## 2016-08-29 MED ORDER — ACETAMINOPHEN 650 MG RE SUPP: 650.0000 mg | Freq: Four times a day (QID) | RECTAL | Status: DC | PRN

## 2016-08-29 MED ORDER — SODIUM CHLORIDE 0.9 % IV SOLN
INTRAVENOUS | Status: AC
  Administered 2016-08-29: 01:00:00 via INTRAVENOUS

## 2016-08-29 MED ORDER — CLOPIDOGREL BISULFATE 75 MG PO TABS
75.0000 mg | ORAL_TABLET | Freq: Every day | ORAL | Status: DC
  Administered 2016-08-29: 75 mg via ORAL
  Filled 2016-08-29: qty 1

## 2016-08-29 MED ORDER — ENOXAPARIN SODIUM 40 MG/0.4ML ~~LOC~~ SOLN
40.0000 mg | SUBCUTANEOUS | Status: DC
  Filled 2016-08-29 (×2): qty 0.4

## 2016-08-29 MED ORDER — ACETAMINOPHEN 325 MG PO TABS: 650.0000 mg | ORAL_TABLET | Freq: Four times a day (QID) | ORAL | Status: DC | PRN

## 2016-08-29 NOTE — Discharge Summary (Signed)
Physician Discharge Summary  Thomas Hanna [id number] DOB: 01-26-1924 DOA: 08/28/2016  PCP: Emeterio Reeve, DO  Admit date: 08/28/2016 Discharge date: 08/29/2016  Admitted From: Home  Disposition:  Home   Recommendations for Outpatient Follow-up:  1. Follow up with PCP in 1-2 weeks 2. Please obtain BMP/CBC in one week 3. Consider cardiology referral for Holter monitor.   Home Health: No    Discharge Condition: Stable.  CODE STATUS: Full code.  Diet recommendation: Heart Healthy  Brief/Interim Summary: Thomas Hanna is a 83 y.o. male with a past medical history significant for CAD s/p CABG in 2007 and PCI x3 and prostate cancer inactive who presents with unresponsive episode.  The patient recently moved to [place] from [place] and lives with his son and daughter in [place].  Per patient, he was totally in his normal state of health today until tonight, when he was sitting at his desk and all of a sudden the next thing he remembers he was surrounded by people.  Per son, the patient was sitting at his desk, when the son came in and found him slumped over.  He shook his dad, who was able to sit up, but was "his eyes were wandering all over" and he wasn't able to speak and kept clutching for his glasses.  Son called 9-1-1 who told him to lay father on ground, patient did groan and babble incoherently, then speak, but was disoriented.  When EMS arrived, he called Thomas Hanna, year "71", called son by brother's name.  At one point, per son, clutched his chest, and stated that he had a vague malaise.  At no point did he have focal weakness, facial asymmetry slurred speech, tonic-clonic movements.  To me, patient denies all those symptoms  1-Syncope; suspect related to hypovolemia, dehydration due to recent infection. He presented with hyponatremia and  and hyperkalemia. Orthostatic vitals positive on admission. He received IV fluids.  Troponin negative, ECHO no significant aortic stenosis.  Normal Ef.  Will benefit from cardiology referral for consideration of Holter, due to second episode. .   2-Hyponatremia; improved after IV fluids.   3-Hyperkalemia; resolved. Will discontinue potasium supplementation at discharge   4-Elevated AST/total bili: normalized.   5-Coronary disease secondary prevention:  Not on BB, ACEi, statin.  BP usually normal range.   -Continue Plavix, aspirin   Discharge Diagnoses:  Active Problems:   Syncope   Coronary artery disease due to lipid rich plaque   Hyponatremia   Elevated LFTs   Normocytic anemia    Discharge Instructions  Discharge Instructions    Diet - low sodium heart healthy    Complete by:  As directed    Increase activity slowly    Complete by:  As directed      Allergies as of 08/29/2016   No Known Allergies     Medication List    STOP taking these medications   POTASSIUM PO     TAKE these medications   aspirin EC 81 MG tablet Take 81 mg by mouth daily.   cholecalciferol 1000 units tablet Commonly known as:  VITAMIN D Take 1,000 Units by mouth daily.   clopidogrel 75 MG tablet Commonly known as:  PLAVIX Take 75 mg by mouth daily.   dorzolamide 2 % ophthalmic solution Commonly known as:  TRUSOPT Place 1 drop into both eyes 2 (two) times daily.   gabapentin 100 MG capsule Commonly known as:  NEURONTIN Take 600 mg by mouth 3 (three) times daily.   latanoprost 0.005 %  ophthalmic solution Commonly known as:  XALATAN Place 1 drop into both eyes at bedtime.   tamsulosin 0.4 MG Caps capsule Commonly known as:  FLOMAX Take 0.4 mg by mouth.   vitamin B-12 100 MCG tablet Commonly known as:  CYANOCOBALAMIN Take 100 mcg by mouth daily.       No Known Allergies  Consultations:  none   Procedures/Studies: Dg Chest 2 View  Result Date: 08/28/2016 CLINICAL DATA:  Syncope. EXAM: CHEST  2 VIEW COMPARISON:  None. FINDINGS: Mild cardiomegaly. Atherosclerotic changes at the aortic arch. Patient is  status post median sternotomy for presumed CABG. Median sternotomy wires appear intact and appropriately aligned. Lungs are hyperexpanded. Lungs are clear. No pleural effusion or pneumothorax seen. No acute or suspicious appearing osseous finding. IMPRESSION: 1. Mild cardiomegaly.  No evidence of CHF. 2. Hyperexpanded lungs suggesting COPD/emphysema. 3. Lungs are clear.  No evidence of pneumonia or pulmonary edema. 4. Aortic atherosclerosis. Electronically Signed   By: Franki Cabot M.D.   On: 08/28/2016 22:42   Ct Head Wo Contrast  Result Date: 08/28/2016 CLINICAL DATA:  Altered mental status. EXAM: CT HEAD WITHOUT CONTRAST TECHNIQUE: Contiguous axial images were obtained from the base of the skull through the vertex without intravenous contrast. COMPARISON:  None. FINDINGS: Brain: Mild generalized age related parenchymal atrophy with commensurate dilatation of the ventricles and sulci. Mild chronic small vessel ischemic changes within the periventricular white matter regions bilaterally. No mass, hemorrhage, edema or other evidence of acute parenchymal abnormality. No extra-axial hemorrhage. Vascular: There are chronic calcified atherosclerotic changes of the large vessels at the skull base. No unexpected hyperdense vessel. Skull: Normal. Negative for fracture or focal lesion. Sinuses/Orbits: No acute finding. Other: None. IMPRESSION: 1. No acute findings.  No intracranial mass, hemorrhage or edema. 2. Chronic microvascular ischemic changes in the white matter, mild in degree for age. Electronically Signed   By: Franki Cabot M.D.   On: 08/28/2016 20:58       Subjective: Feeling well, was walking in the hall without assistance.   Discharge Exam: Vitals:   08/29/16 0442 08/29/16 0800  BP: (!) 162/57 (!) 150/50  Pulse: 62 62  Resp: 18 18  Temp: 97.5 F (36.4 C) 98 F (36.7 C)   Vitals:   08/29/16 0000 08/29/16 0049 08/29/16 0442 08/29/16 0800  BP: 142/83  (!) 162/57 (!) 150/50  Pulse: 60   62 62  Resp: 26 20 18 18   Temp:  97.9 F (36.6 C) 97.5 F (36.4 C) 98 F (36.7 C)  TempSrc:  Oral Oral Oral  SpO2: 100% 100% 98% 98%  Weight:  74.6 kg (164 lb 8 oz)    Height:  5\' 11"  (1.803 m)      General: Pt is alert, awake, not in acute distress Cardiovascular: RRR, S1/S2 +, no rubs, no gallops Respiratory: CTA bilaterally, no wheezing, no rhonchi Abdominal: Soft, NT, ND, bowel sounds + Extremities: no edema, no cyanosis    The results of significant diagnostics from this hospitalization (including imaging, microbiology, ancillary and laboratory) are listed below for reference.     Microbiology: No results found for this or any previous visit (from the past 240 hour(s)).   Labs: BNP (last 3 results) No results for input(s): BNP in the last 8760 hours. Basic Metabolic Panel:  Recent Labs Lab 08/28/16 2045 08/28/16 2053 08/28/16 2207 08/29/16 0550  NA 133* 133* 134* 137  K 5.2* 5.9* 4.6 4.3  CL 100* 99* 101 105  CO2 23  --  26 25  GLUCOSE 110* 115* 127* 104*  BUN 24* 32* 23* 17  CREATININE 0.73 0.90 0.78 0.66  CALCIUM 9.1  --  9.1 8.8*   Liver Function Tests:  Recent Labs Lab 08/28/16 2045 08/29/16 0040 08/29/16 0550  AST 43*  --  24  ALT 18  --  18  ALKPHOS 59  --  59  BILITOT 1.5* 0.4 0.5  PROT 6.5  --  6.6  ALBUMIN 3.8  --  3.6   No results for input(s): LIPASE, AMYLASE in the last 168 hours. No results for input(s): AMMONIA in the last 168 hours. CBC:  Recent Labs Lab 08/28/16 2045 08/28/16 2053 08/29/16 0550  WBC 10.4  --  9.1  NEUTROABS 7.5  --   --   HGB 11.4* 11.9* 11.9*  HCT 34.6* 35.0* 36.5*  MCV 88.5  --  88.8  PLT 321  --  304   Cardiac Enzymes:  Recent Labs Lab 08/29/16 0040 08/29/16 0550  TROPONINI <0.03 <0.03   BNP: Invalid input(s): POCBNP CBG: No results for input(s): GLUCAP in the last 168 hours. D-Dimer No results for input(s): DDIMER in the last 72 hours. Hgb A1c No results for input(s): HGBA1C in the  last 72 hours. Lipid Profile No results for input(s): CHOL, HDL, LDLCALC, TRIG, CHOLHDL, LDLDIRECT in the last 72 hours. Thyroid function studies No results for input(s): TSH, T4TOTAL, T3FREE, THYROIDAB in the last 72 hours.  Invalid input(s): FREET3 Anemia work up No results for input(s): VITAMINB12, FOLATE, FERRITIN, TIBC, IRON, RETICCTPCT in the last 72 hours. Urinalysis    Component Value Date/Time   COLORURINE YELLOW 08/28/2016 2314   APPEARANCEUR CLEAR 08/28/2016 2314   LABSPEC 1.012 08/28/2016 2314   PHURINE 6.0 08/28/2016 2314   GLUCOSEU NEGATIVE 08/28/2016 2314   HGBUR NEGATIVE 08/28/2016 2314   BILIRUBINUR NEGATIVE 08/28/2016 2314   KETONESUR NEGATIVE 08/28/2016 2314   PROTEINUR NEGATIVE 08/28/2016 2314   NITRITE NEGATIVE 08/28/2016 2314   LEUKOCYTESUR NEGATIVE 08/28/2016 2314   Sepsis Labs Invalid input(s): PROCALCITONIN,  WBC,  LACTICIDVEN Microbiology No results found for this or any previous visit (from the past 240 hour(s)).   Time coordinating discharge: Over 30 minutes  SIGNED:   Elmarie Shiley, MD  [facility] 08/29/2016, 10:24 AM Pager   If 7PM-7AM, please contact night-coverage [email or web address] Password [email or web address]

## 2016-08-29 NOTE — Progress Notes (Signed)
  Echocardiogram 2D Echocardiogram has been performed.  Thomas Hanna 08/29/2016, 8:58 AM

## 2016-08-29 NOTE — Progress Notes (Signed)
Patient and son given discharge instructions and all questions answered.  Pt. Discharged via wheelchair with all belongings.

## 2016-08-29 NOTE — H&P (Signed)
History and Physical  Patient Name: Thomas Hanna     [id number]    DOB: July 07, 1924    DOA: 08/28/2016 PCP: Emeterio Reeve, DO   Patient coming from: Home via EMS  Chief Complaint: Unresponsive episode  HPI: Thomas Hanna is a 83 y.o. male with a past medical history significant for CAD s/p CABG in 2007 and PCI x3 and prostate cancer inactive who presents with unresponsive episode.  The patient recently moved to [place] from [place] and lives with his son and daughter in [employer] [place].  Per patient, he was totally in his normal state of health today until tonight, when he was sitting at his desk and all of a sudden the next thing he remembers he was surrounded by people.  Per son, the patient was sitting at his desk, when the son came in and found him slumped over.  He shook his dad, who was able to sit up, but was "his eyes were wandering all over" and he wasn't able to speak and kept clutching for his glasses.  Son called 9-1-1 who told him to lay father on ground, patient did groan and babble incoherently, then speak, but was disoriented.  When EMS arrived, he called president JFK, year "16", called son by brother's name.  At one point, per son, clutched his chest, and stated that he had a vague malaise.  At no point did he have focal weakness, facial asymmetry slurred speech, tonic-clonic movements.  To me, patient denies all those symptoms.    ED course: -Afebrile, heart rate 57, respirations and pulse is normal, blood pressure 157/59 -Na 133, K 5.2, Cr 0.73 (baseline unknown), WBC 10.4 K, Hgb 11.4 normocytic -AST 43, ALT normal, total bilirubin 1.5 -Alcohol negative -Troponin negative -BUN/creatinine ratio elevated -CT head unremarkable -Chest x-ray without edema airspace disease -ECG showed normal sinus rhythm with no ST changes -The patient had completely returned to normal while in the ER, and TRH were asked to observe for syncope    He did recently have a worsening cough, was  diagnosed with pneumonia and treated as outpatient with azithromycin, which he completed.  Son relates that patient did have a similar episode several months ago when he took two nitro, seemed to pass out, was briefly unresponsive, then slowly regained his normal state, just that this time it took so much longer to return to normal.  No workup sought at that time.        ROS: Review of Systems  Constitutional: Negative for chills and fever.  Eyes: Negative for blurred vision and double vision.  Respiratory: Positive for cough (resolving). Negative for shortness of breath and wheezing.   Cardiovascular: Negative for chest pain and palpitations.  Gastrointestinal: Negative for abdominal pain, blood in stool, melena, nausea and vomiting.  Musculoskeletal: Negative for back pain.  Neurological: Positive for loss of consciousness. Negative for dizziness, sensory change, speech change, focal weakness, seizures and headaches.  Psychiatric/Behavioral: Negative for hallucinations and memory loss. The patient is not nervous/anxious.   All other systems reviewed and are negative.         Past Medical History:  Diagnosis Date  . Coronary artery disease   . MI (myocardial infarction)   . Prostate cancer [facility])     Past Surgical History:  Procedure Laterality Date  . cardiac stents    . CARDIAC SURGERY    . CORONARY ARTERY BYPASS GRAFT      Social History: Patient lives with his son and daughter in [place].  The patient walks unassisted.  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  He smoked, quit >40 years ago.  He drinks alcohol rarely.  Wife is deceased.  No Known Allergies  Family history: family history includes Diabetes in his mother; Uterine cancer in his sister.  Prior to Admission medications   Medication Sig Start Date End Date Taking? Authorizing Provider  aspirin EC 81 MG tablet Take 81 mg by mouth  daily.   Yes Historical Provider, MD  cholecalciferol (VITAMIN D) 1000 units tablet Take 1,000 Units by mouth daily.   Yes Historical Provider, MD  clopidogrel (PLAVIX) 75 MG tablet Take 75 mg by mouth daily.   Yes Historical Provider, MD  dorzolamide (TRUSOPT) 2 % ophthalmic solution Place 1 drop into both eyes 2 (two) times daily.   Yes Historical Provider, MD  gabapentin (NEURONTIN) 100 MG capsule Take 600 mg by mouth 3 (three) times daily.    Yes Historical Provider, MD  latanoprost (XALATAN) 0.005 % ophthalmic solution Place 1 drop into both eyes at bedtime.    Yes Historical Provider, MD  POTASSIUM PO Take 1 tablet by mouth daily.   Yes Historical Provider, MD  tamsulosin (FLOMAX) 0.4 MG CAPS capsule Take 0.4 mg by mouth.   Yes Historical Provider, MD  vitamin B-12 (CYANOCOBALAMIN) 100 MCG tablet Take 100 mcg by mouth daily.   Yes Historical Provider, MD       Physical Exam: BP 142/83   Pulse 60   Temp 97.6 F (36.4 C)   Resp 26   SpO2 100%  General appearance: Well-developed, elderly adult male, alert and in no acute distress, appears tired, oriented.   Eyes: Anicteric, conjunctiva pink, lids and lashes normal. PERRL.    ENT: No nasal deformity, discharge, epistaxis.  Hearing normal. OP moist without lesions.   Neck: No neck masses.  Trachea midline.  No thyromegaly/tenderness. Lymph: No cervical or supraclavicular lymphadenopathy. Skin: Warm and dry.  No jaundice.  No suspicious rashes or lesions. Cardiac: RRR, nl S1-S2, no murmurs appreciated.  Capillary refill is brisk.  JVP normal.  No LE edema.  Radial and DP pulses 2+ and symmetric.  No carotid bruits. Respiratory: Normal respiratory rate and rhythm.  CTAB without rales or wheezes. Abdomen: Abdomen soft.  No TTP, or guarding or rebound or rigidity. No ascites, distension, hepatosplenomegaly.   MSK: No deformities or effusions.  No cyanosis or clubbing. Neuro: Cranial nerves 3-12 intact.  Sensation intact to light touch.  Speech is fluent.  Muscle strength 5/5 in upper and lower extremities bilatearlly.  FTN normal.  RAM normal.   Recall normal.   Psych: Sensorium intact and responding to questions, attention normal.  Behavior appropriate.  Affect pleasant.  Judgment and insight appear normal.     Labs on Admission:  I have personally reviewed following labs and imaging studies: CBC:  Recent Labs Lab 08/28/16 2045 08/28/16 2053  WBC 10.4  --   NEUTROABS 7.5  --   HGB 11.4* 11.9*  HCT 34.6* 35.0*  MCV 88.5  --   PLT 321  --    Basic Metabolic Panel:  Recent Labs Lab 08/28/16 2045 08/28/16 2053 08/28/16 2207  NA 133* 133* 134*  K 5.2* 5.9* 4.6  CL 100* 99* 101  CO2 23  --  26  GLUCOSE 110* 115* 127*  BUN 24* 32* 23*  CREATININE 0.73 0.90 0.78  CALCIUM 9.1  --  9.1  GFR: Estimated Creatinine Clearance: 65.4 mL/min (by C-G formula based on SCr of 0.78 mg/dL).  Liver Function Tests:  Recent Labs Lab 08/28/16 2045  AST 43*  ALT 18  ALKPHOS 59  BILITOT 1.5*  PROT 6.5  ALBUMIN 3.8   No results for input(s): LIPASE, AMYLASE in the last 168 hours. No results for input(s): AMMONIA in the last 168 hours. Coagulation Profile:  Recent Labs Lab 08/28/16 2045  INR 0.95   Cardiac Enzymes: No results for input(s): CKTOTAL, CKMB, CKMBINDEX, TROPONINI in the last 168 hours. BNP (last 3 results) No results for input(s): PROBNP in the last 8760 hours. HbA1C: No results for input(s): HGBA1C in the last 72 hours. CBG: No results for input(s): GLUCAP in the last 168 hours. Lipid Profile: No results for input(s): CHOL, HDL, LDLCALC, TRIG, CHOLHDL, LDLDIRECT in the last 72 hours. Thyroid Function Tests: No results for input(s): TSH, T4TOTAL, FREET4, T3FREE, THYROIDAB in the last 72 hours. Anemia Panel: No results for input(s): VITAMINB12, FOLATE, FERRITIN, TIBC, IRON, RETICCTPCT in the last 72 hours. Sepsis Labs: Invalid input(s): PROCALCITONIN, LACTICIDVEN No results found for this  or any previous visit (from the past 240 hour(s)).       Radiological Exams on Admission: Personally reviewed CXR shows no edema or airspace disease, CT head report reviewed: Dg Chest 2 View  Result Date: 08/28/2016 CLINICAL DATA:  Syncope. EXAM: CHEST  2 VIEW COMPARISON:  None. FINDINGS: Mild cardiomegaly. Atherosclerotic changes at the aortic arch. Patient is status post median sternotomy for presumed CABG. Median sternotomy wires appear intact and appropriately aligned. Lungs are hyperexpanded. Lungs are clear. No pleural effusion or pneumothorax seen. No acute or suspicious appearing osseous finding. IMPRESSION: 1. Mild cardiomegaly.  No evidence of CHF. 2. Hyperexpanded lungs suggesting COPD/emphysema. 3. Lungs are clear.  No evidence of pneumonia or pulmonary edema. 4. Aortic atherosclerosis. Electronically Signed   By: Franki Cabot M.D.   On: 08/28/2016 22:42   Ct Head Wo Contrast  Result Date: 08/28/2016 CLINICAL DATA:  Altered mental status. EXAM: CT HEAD WITHOUT CONTRAST TECHNIQUE: Contiguous axial images were obtained from the base of the skull through the vertex without intravenous contrast. COMPARISON:  None. FINDINGS: Brain: Mild generalized age related parenchymal atrophy with commensurate dilatation of the ventricles and sulci. Mild chronic small vessel ischemic changes within the periventricular white matter regions bilaterally. No mass, hemorrhage, edema or other evidence of acute parenchymal abnormality. No extra-axial hemorrhage. Vascular: There are chronic calcified atherosclerotic changes of the large vessels at the skull base. No unexpected hyperdense vessel. Skull: Normal. Negative for fracture or focal lesion. Sinuses/Orbits: No acute finding. Other: None. IMPRESSION: 1. No acute findings.  No intracranial mass, hemorrhage or edema. 2. Chronic microvascular ischemic changes in the white matter, mild in degree for age. Electronically Signed   By: Franki Cabot M.D.   On:  08/28/2016 20:58    EKG: Independently reviewed. Rate 59, QTc 435, normal sinus rhythm, no ST changes.      Assessment/Plan  1. Syncope:  This is favored to have been a vagal episode (slumped over, easily rousable, somewhat disoriented after, but completely back to normal within 2 hours).  Similar to previous episode, just prolonged.  This time no focal deficits to suggest TIA, nothing to suggest seizure.  No anginal symptoms now, no arrhythmia on ECG or ER telemetry. -Monitor on tele -Check orthostatics, I/Os -Gentle fluids overnight and trend electrolytes -Cycle enzymes -Echo ordered, if significant delay to obtain, this could be done as  outpatient    2. Hyponatremia:  Presumed hypovolemic or euvolemic by exam. -Check urine Na, osmo and serum Osms  3. Elevated AST/total bili:  Very small elevations.  No previous for comparison.  Denies liver disease. -Trend LFT -Check bilirubin fractions -Abdomen exam benign, low suspicion for inta-abdominal process, will defer [place] unless LFT trending up  4. Coronary disease secondary prevention:  Not on BB, ACEi, statin.  BP usually normal range.   -Continue Plavix, aspirin  5. Glaucoma:  -Hold nonessential eye drops while OBS status  6. Anemia:  Unclear baseline.  Normocytic.  No history to suggest bleeding. -Trend and if stable, follow up with PCP       DVT prophylaxis: Lovenox  Code Status: FULL  Family Communication: Son at bedside, CODE STATUS discussed, patient contemplative  Disposition Plan: Anticipate observation overnight, echo possbily tomorrow and home if all tests normal. Consults called: None Admission status: OBS At the point of initial evaluation, it is my clinical opinion that admission for OBSERVATION is reasonable and necessary because the patient's presenting complaints in the context of their chronic conditions represent sufficient risk of deterioration or significant morbidity to constitute reasonable grounds  for close observation in the hospital setting, but that the patient may be medically stable for discharge from the hospital within 24 to 48 hours.    Medical decision making: Patient seen at 11:40 PM on 08/28/2016.  The patient was discussed with Dr. Leonette Monarch.  What exists of the patient's chart was reviewed in depth and summarized above.  Clinical condition: stable.        Edwin Dada [facility] Pager [phone]

## 2016-09-02 ENCOUNTER — Encounter: Payer: Self-pay | Admitting: Osteopathic Medicine

## 2016-09-02 ENCOUNTER — Ambulatory Visit (INDEPENDENT_AMBULATORY_CARE_PROVIDER_SITE_OTHER): Payer: Medicare Other | Admitting: Osteopathic Medicine

## 2016-09-02 VITALS — BP 170/64 | HR 67 | Ht 71.5 in | Wt 169.0 lb

## 2016-09-02 DIAGNOSIS — E871 Hypo-osmolality and hyponatremia: Secondary | ICD-10-CM

## 2016-09-02 DIAGNOSIS — I2583 Coronary atherosclerosis due to lipid rich plaque: Secondary | ICD-10-CM | POA: Diagnosis not present

## 2016-09-02 DIAGNOSIS — I2510 Atherosclerotic heart disease of native coronary artery without angina pectoris: Secondary | ICD-10-CM | POA: Diagnosis not present

## 2016-09-02 DIAGNOSIS — D649 Anemia, unspecified: Secondary | ICD-10-CM | POA: Diagnosis not present

## 2016-09-02 DIAGNOSIS — R55 Syncope and collapse: Secondary | ICD-10-CM | POA: Diagnosis not present

## 2016-09-02 DIAGNOSIS — Z9861 Coronary angioplasty status: Secondary | ICD-10-CM | POA: Diagnosis not present

## 2016-09-02 MED ORDER — LOSARTAN POTASSIUM 25 MG PO TABS: 25.0000 mg | ORAL_TABLET | Freq: Every day | ORAL | 1 refills | Status: DC

## 2016-09-02 NOTE — Progress Notes (Signed)
HPI: Thomas Hanna is a 83 y.o. male  who presents to [place] today, 09/02/16,  for chief complaint of:  Chief Complaint  Patient presents with  . Establish Care    REFERRAL FOR CARDIOLOGY  . Hospitalization Follow-up    CHEST PAIN EKG DONE 08/29/2016    Patient is new to the area, needs to establish care and is concerned about "episodes" as noted below.   Initial event - son found unresponsive, 911 was called. Eyes moving different directions, hands moving randomly. Speech abnormalities.  Improved wen lying prone. Twice has had similar episodes, fell when stood up to get glass of water.   Records reviewed:  Hospitalization discharge summary: Admitted 08/28/2016 for syncope suspected related to hypovolemia/dehydration due to recent infection with positive orthostatic vital signs, discharge 08/29/2016. Plan to follow-up with PCP 1-2 weeks to obtain BMP/CBC, consider cardiology referral. Electrolyte derangements include hyponatremia, hyperkalemia which was corrected with IV fluids and potassium supplementation. History of coronary artery disease, patient is not on secondary prevention with beta blocker, ACE inhibitor, statin. Discharge medications include aspirin 81 daily, Plavix daily, also on eyedrops, vitamin D, B12, Flomax  Urgent care records: Seen 08/22/2016 for diagnosis bronchitis  Cardiac records: not available at this time  Past medical, surgical, social and family history reviewed: Patient Active Problem List   Diagnosis Date Noted  . Coronary artery disease due to lipid rich plaque 08/29/2016  . Hyponatremia 08/29/2016  . Elevated LFTs 08/29/2016  . Normocytic anemia 08/29/2016  . Syncope 08/28/2016   Past Surgical History:  Procedure Laterality Date  . cardiac stents    . CARDIAC SURGERY    . CORONARY ARTERY BYPASS GRAFT     Social History  Substance Use Topics  . Smoking status: Former Smoker    Quit date: 1970  .  Smokeless tobacco: Never Used  . Alcohol use No   Family History  Problem Relation Age of Onset  . Diabetes Mother   . Uterine cancer Sister      Current medication list and allergy/intolerance information reviewed:   Current Outpatient Prescriptions  Medication Sig Dispense Refill  . aspirin EC 81 MG tablet Take 81 mg by mouth daily.    . cholecalciferol (VITAMIN D) 1000 units tablet Take 1,000 Units by mouth daily.    . clopidogrel (PLAVIX) 75 MG tablet Take 75 mg by mouth daily.    . dorzolamide (TRUSOPT) 2 % ophthalmic solution Place 1 drop into both eyes 2 (two) times daily.    Marland Kitchen gabapentin (NEURONTIN) 100 MG capsule Take 600 mg by mouth 3 (three) times daily.     Marland Kitchen latanoprost (XALATAN) 0.005 % ophthalmic solution Place 1 drop into both eyes at bedtime.     . tamsulosin (FLOMAX) 0.4 MG CAPS capsule Take 0.4 mg by mouth.    . vitamin B-12 (CYANOCOBALAMIN) 100 MCG tablet Take 100 mcg by mouth daily.     No current facility-administered medications for this visit.    No Known Allergies    Review of Systems:  Constitutional:  No  fever, no chills, No recent illness, No unintentional weight changes. No significant fatigue.   HEENT: No  headache, no vision change, no hearing change, No sore throat, No  sinus pressure  Cardiac: +chest pain, No  pressure, No palpitations, No  Orthopnea  Respiratory:  No  shortness of breath. No  Cough  Gastrointestinal: No  abdominal pain, No  nausea, No  vomiting,  No  blood in  stool, No  diarrhea, No  constipation   Musculoskeletal: No new myalgia/arthralgia  Genitourinary: No  incontinence, No  abnormal genital bleeding, No abnormal genital discharge  Skin: No  Rash, No other wounds/concerning lesions  Hem/Onc: +easy bruising/bleeding, No  abnormal lymph node  Endocrine: No cold intolerance,  No heat intolerance. No polyuria/polydipsia/polyphagia   Neurologic: No  weakness, No  dizziness, No  slurred speech/focal weakness/facial  droop  Psychiatric: No  concerns with depression, No  concerns with anxiety, No sleep problems, No mood problems  Exam:  BP (!) 170/64   Pulse 67   Ht 5' 11.5" (1.816 m)   Wt 169 lb (76.7 kg)   BMI 23.24 kg/m   Constitutional: VS see above. General Appearance: alert, well-developed, well-nourished, NAD  Eyes: Normal lids and conjunctive, non-icteric sclera  Ears, Nose, Mouth, Throat: MMM, Normal external inspection ears/nares/mouth/lips/gums.   Neck: No masses, trachea midline.   Respiratory: Normal respiratory effort. no wheeze, no rhonchi, no rales  Cardiovascular: S1/S2 normal, no murmur, no rub/gallop auscultated. RRR. No lower extremity edema. Pedal pulse II/IV bilaterally DP and PT.   Musculoskeletal: Gait normal. No clubbing/cyanosis of digits.   Neurological: Normal balance/coordination. No tremor. No cranial nerve deficit on limited exam.   Skin: warm, dry, intact. No rash/ulcer.  Psychiatric: Normal judgment/insight. Normal mood and affect. Oriented x3.      ASSESSMENT/PLAN:   Info printed on TIA - per description of these episodes I think TIA is a consideration though sounds like orthostatic episodes to me as well. Recent echo results reviewed. Offered option for MRI, family will think about this. I stressed more important to initiate secondary prevention measures with statin, BP control. Pt is on plavix. I'm concerned to add BP meds given likely orthostatic events, though family is resistant to the idea of orthostasis as cause, "we are making sure he is drinking enough." Refer to cardiology - need to consider stress testing or event monitoring.   Syncope, unspecified syncope type - Plan: CBC with Differential/Platelet, COMPLETE METABOLIC PANEL WITH GFR, TSH, Ambulatory referral to Cardiology, VAS US CAROTID, CANCELED: ECHOCARDIOGRAM COMPLETE, CANCELED: VAS US CAROTID  Coronary artery disease due to lipid rich plaque - Plan: Ambulatory referral to Cardiology, Lipid  panel  Hyponatremia - Plan: COMPLETE METABOLIC PANEL WITH GFR  Normocytic anemia - Plan: CBC with Differential/Platelet       Visit summary with medication list and pertinent instructions was printed for patient to review. All questions at time of visit were answered - patient instructed to contact office with any additional concerns. ER/RTC precautions were reviewed with the patient. Follow-up plan: Return in about 1 week (around 09/09/2016) for blood pressure followup.

## 2016-09-02 NOTE — Patient Instructions (Signed)
Transient Ischemic Attack °A transient ischemic attack (TIA) is a "warning stroke" that causes stroke-like symptoms. Unlike a stroke, a TIA does not cause permanent damage to the brain. The symptoms of a TIA can happen very fast and do not last long. It is important to know the symptoms of a TIA and what to do. This can help prevent a major stroke or death. °What are the causes? °A TIA is caused by a temporary blockage in an artery in the brain or neck (carotid artery). The blockage does not allow the brain to get the blood supply it needs and can cause different symptoms. The blockage can be caused by either: °· A blood clot. °· Fatty buildup (plaque) in a neck or brain artery. ° °What increases the risk? °· High blood pressure (hypertension). °· High cholesterol. °· Diabetes mellitus. °· Heart disease. °· The buildup of plaque in the blood vessels (peripheral artery disease or atherosclerosis). °· The buildup of plaque in the blood vessels that provide blood and oxygen to the brain (carotid artery stenosis). °· An abnormal heart rhythm (atrial fibrillation). °· Obesity. °· Using any tobacco products, including cigarettes, chewing tobacco, or electronic cigarettes. °· Taking oral contraceptives, especially in combination with using tobacco. °· Physical inactivity. °· A diet high in fats, salt (sodium), and calories. °· Excessive alcohol use. °· Use of illegal drugs (especially cocaine and methamphetamine). °· Being male. °· Being African American. °· Being over the age of 55 years. °· Family history of stroke. °· Previous history of blood clots, stroke, TIA, or heart attack. °· Sickle cell disease. °What are the signs or symptoms? °TIA symptoms are the same as a stroke but are temporary. These symptoms usually develop suddenly, or may be newly present upon waking from sleep: °· Sudden weakness or numbness of the face, arm, or leg, especially on one side of the body. °· Sudden trouble walking or difficulty moving  arms or legs. °· Sudden confusion. °· Sudden personality changes. °· Trouble speaking (aphasia) or understanding. °· Difficulty swallowing. °· Sudden trouble seeing in one or both eyes. °· Double vision. °· Dizziness. °· Loss of balance or coordination. °· Sudden severe headache with no known cause. °· Trouble reading or writing. °· Loss of bowel or bladder control. °· Loss of consciousness. ° °How is this diagnosed? °Your health care provider may be able to determine the presence or absence of a TIA based on your symptoms, history, and physical exam. CT scan of the brain is usually performed to help identify a TIA. Other tests may include: °· Electrocardiography (ECG). °· Continuous heart monitoring. °· Echocardiography. °· Carotid ultrasonography. °· MRI. °· A scan of the brain circulation. °· Blood tests. ° °How is this treated? °Since the symptoms of TIA are the same as a stroke, it is important to seek treatment as soon as possible. You may need a medicine to dissolve a blood clot (thrombolytic) if that is the cause of the TIA. This medicine cannot be given if too much time has passed. Treatment may also include: °· Rest, oxygen, fluids through an IV tube, and medicines to thin the blood (anticoagulants). °· Measures will be taken to prevent short-term and long-term complications, including infection from breathing foreign material into the lungs (aspiration pneumonia), blood clots in the legs, and falls. °· Procedures to either remove plaque in the carotid arteries or dilate carotid arteries that have narrowed due to plaque. Those procedures are: °? Carotid endarterectomy. °? Carotid angioplasty and stenting. °· Medicines   and diet may be used to address diabetes, high blood pressure, and other underlying risk factors. ° °Follow these instructions at home: °· Take medicines only as directed by your health care provider. Follow the directions carefully. Medicines may be used to control risk factors for a stroke.  Be sure you understand all your medicine instructions. °· You may be told to take aspirin or the anticoagulant warfarin. Warfarin needs to be taken exactly as instructed. °? Taking too much or too little warfarin is dangerous. Too much warfarin increases the risk of bleeding. Too little warfarin continues to allow the risk for blood clots. While taking warfarin, you will need to have regular blood tests to measure your blood clotting time. A PT blood test measures how long it takes for blood to clot. Your PT is used to calculate another value called an INR. Your PT and INR help your health care provider to adjust your dose of warfarin. The dose can change for many reasons. It is critically important that you take warfarin exactly as prescribed. °? Many foods, especially foods high in vitamin K can interfere with warfarin and affect the PT and INR. Foods high in vitamin K include spinach, kale, broccoli, cabbage, collard and turnip greens, Brussels sprouts, peas, cauliflower, seaweed, and parsley, as well as beef and pork liver, green tea, and soybean oil. You should eat a consistent amount of foods high in vitamin K. Avoid major changes in your diet, or notify your health care provider before changing your diet. Arrange a visit with a dietitian to answer your questions. °? Many medicines can interfere with warfarin and affect the PT and INR. You must tell your health care provider about any and all medicines you take; this includes all vitamins and supplements. Be especially cautious with aspirin and anti-inflammatory medicines. Do not take or discontinue any prescribed or over-the-counter medicine except on the advice of your health care provider or pharmacist. °? Warfarin can have side effects, such as excessive bruising or bleeding. You will need to hold pressure over cuts for longer than usual. Your health care provider or pharmacist will discuss other potential side effects. °? Avoid sports or activities that  may cause injury or bleeding. °? Be careful when shaving, flossing your teeth, or handling sharp objects. °? Alcohol can change the body's ability to handle warfarin. It is best to avoid alcoholic drinks or consume only very small amounts while taking warfarin. Notify your health care provider if you change your alcohol intake. °? Notify your dentist or other health care providers before procedures. °· Eat a diet that includes 5 or more servings of fruits and vegetables each day. This may reduce the risk of stroke. Certain diets may be prescribed to address high blood pressure, high cholesterol, diabetes, or obesity. °? A diet low in sodium, saturated fat, trans fat, and cholesterol is recommended to manage high blood pressure. °? A diet low in saturated fat, trans fat, and cholesterol, and high in fiber may control cholesterol levels. °? A controlled-carbohydrate, controlled-sugar diet is recommended to manage diabetes. °? A reduced-calorie diet that is low in sodium, saturated fat, trans fat, and cholesterol is recommended to manage obesity. °· Maintain a healthy weight. °· Stay physically active. It is recommended that you get at least 30 minutes of activity on most or all days. °· Do not use any tobacco products, including cigarettes, chewing tobacco, or electronic cigarettes. If you need help quitting, ask your health care provider. °· Limit alcohol intake   to no more than 1 drink per day for nonpregnant women and 2 drinks per day for men. One drink equals 12 ounces of beer, 5 ounces of wine, or 1½ ounces of hard liquor. °· Do not abuse drugs. °· A safe home environment is important to reduce the risk of falls. Your health care provider may arrange for specialists to evaluate your home. Having grab bars in the bedroom and bathroom is often important. Your health care provider may arrange for equipment to be used at home, such as raised toilets and a seat for the shower. °· Follow all instructions for follow-up  with your health care provider. This is very important. This includes any referrals and lab tests. Proper follow-up can prevent a stroke or another TIA from occurring. °How is this prevented? °The risk of a TIA can be decreased by appropriately treating high blood pressure, high cholesterol, diabetes, heart disease, and obesity, and by quitting smoking, limiting alcohol, and staying physically active. °Contact a health care provider if: °· You have personality changes. °· You have difficulty swallowing. °· You are seeing double. °· You have dizziness. °· You have a fever. °Get help right away if: °Any of the following symptoms may represent a serious problem that is an emergency. Do not wait to see if the symptoms will go away. Get medical help right away. Call your local emergency services (911 in [place] Do not drive yourself to the hospital. °· You have sudden weakness or numbness of the face, arm, or leg, especially on one side of the body. °· You have sudden trouble walking or difficulty moving arms or legs. °· You have sudden confusion. °· You have trouble speaking (aphasia) or understanding. °· You have sudden trouble seeing in one or both eyes. °· You have a loss of balance or coordination. °· You have a sudden, severe headache with no known cause. °· You have new chest pain or an irregular heartbeat. °· You have a partial or total loss of consciousness. ° °This information is not intended to replace advice given to you by your health care provider. Make sure you discuss any questions you have with your health care provider. °Document Released: 05/08/2005 Document Revised: 04/01/2016 Document Reviewed: 11/03/2013 °Elsevier Interactive Patient Education © 2017 [employer] ° °

## 2016-09-03 ENCOUNTER — Emergency Department (HOSPITAL_COMMUNITY): Payer: Medicare Other

## 2016-09-03 ENCOUNTER — Encounter (HOSPITAL_COMMUNITY): Payer: Self-pay | Admitting: Emergency Medicine

## 2016-09-03 ENCOUNTER — Inpatient Hospital Stay (HOSPITAL_COMMUNITY)
Admission: EM | Admit: 2016-09-03 | Discharge: 2016-09-09 | DRG: 065 | Disposition: A | Discharge status: Home Health | Payer: Medicare Other | Attending: Cardiovascular Disease | Admitting: Cardiovascular Disease

## 2016-09-03 ENCOUNTER — Ambulatory Visit (INDEPENDENT_AMBULATORY_CARE_PROVIDER_SITE_OTHER): Payer: Medicare Other | Admitting: Cardiology

## 2016-09-03 ENCOUNTER — Encounter: Payer: Self-pay | Admitting: Cardiology

## 2016-09-03 ENCOUNTER — Ambulatory Visit (HOSPITAL_COMMUNITY)
Admission: RE | Admit: 2016-09-03 | Discharge: 2016-09-03 | Disposition: A | Discharge status: Home / Self Care | Payer: Medicare Other | Source: Ambulatory Visit | Attending: Osteopathic Medicine | Admitting: Osteopathic Medicine

## 2016-09-03 ENCOUNTER — Other Ambulatory Visit: Payer: Self-pay

## 2016-09-03 ENCOUNTER — Observation Stay (HOSPITAL_COMMUNITY): Payer: Medicare Other

## 2016-09-03 DIAGNOSIS — I951 Orthostatic hypotension: Secondary | ICD-10-CM

## 2016-09-03 DIAGNOSIS — D649 Anemia, unspecified: Secondary | ICD-10-CM | POA: Diagnosis not present

## 2016-09-03 DIAGNOSIS — R2689 Other abnormalities of gait and mobility: Secondary | ICD-10-CM

## 2016-09-03 DIAGNOSIS — R55 Syncope and collapse: Secondary | ICD-10-CM

## 2016-09-03 DIAGNOSIS — G629 Polyneuropathy, unspecified: Secondary | ICD-10-CM | POA: Diagnosis not present

## 2016-09-03 DIAGNOSIS — Z79899 Other long term (current) drug therapy: Secondary | ICD-10-CM

## 2016-09-03 DIAGNOSIS — R42 Dizziness and giddiness: Secondary | ICD-10-CM | POA: Diagnosis not present

## 2016-09-03 DIAGNOSIS — Z8546 Personal history of malignant neoplasm of prostate: Secondary | ICD-10-CM

## 2016-09-03 DIAGNOSIS — I2510 Atherosclerotic heart disease of native coronary artery without angina pectoris: Secondary | ICD-10-CM | POA: Insufficient documentation

## 2016-09-03 DIAGNOSIS — E785 Hyperlipidemia, unspecified: Secondary | ICD-10-CM | POA: Diagnosis not present

## 2016-09-03 DIAGNOSIS — R4182 Altered mental status, unspecified: Secondary | ICD-10-CM | POA: Diagnosis not present

## 2016-09-03 DIAGNOSIS — Z955 Presence of coronary angioplasty implant and graft: Secondary | ICD-10-CM

## 2016-09-03 DIAGNOSIS — Z7982 Long term (current) use of aspirin: Secondary | ICD-10-CM

## 2016-09-03 DIAGNOSIS — E860 Dehydration: Secondary | ICD-10-CM | POA: Diagnosis not present

## 2016-09-03 DIAGNOSIS — I639 Cerebral infarction, unspecified: Secondary | ICD-10-CM | POA: Diagnosis not present

## 2016-09-03 DIAGNOSIS — I6339 Cerebral infarction due to thrombosis of other cerebral artery: Secondary | ICD-10-CM

## 2016-09-03 DIAGNOSIS — E871 Hypo-osmolality and hyponatremia: Secondary | ICD-10-CM | POA: Diagnosis present

## 2016-09-03 DIAGNOSIS — R739 Hyperglycemia, unspecified: Secondary | ICD-10-CM | POA: Diagnosis present

## 2016-09-03 DIAGNOSIS — I252 Old myocardial infarction: Secondary | ICD-10-CM

## 2016-09-03 DIAGNOSIS — Z951 Presence of aortocoronary bypass graft: Secondary | ICD-10-CM

## 2016-09-03 DIAGNOSIS — J449 Chronic obstructive pulmonary disease, unspecified: Secondary | ICD-10-CM | POA: Diagnosis present

## 2016-09-03 DIAGNOSIS — I10 Essential (primary) hypertension: Secondary | ICD-10-CM | POA: Diagnosis present

## 2016-09-03 DIAGNOSIS — Z9861 Coronary angioplasty status: Secondary | ICD-10-CM

## 2016-09-03 DIAGNOSIS — Z7902 Long term (current) use of antithrombotics/antiplatelets: Secondary | ICD-10-CM

## 2016-09-03 DIAGNOSIS — I6523 Occlusion and stenosis of bilateral carotid arteries: Secondary | ICD-10-CM | POA: Diagnosis not present

## 2016-09-03 DIAGNOSIS — Z87891 Personal history of nicotine dependence: Secondary | ICD-10-CM

## 2016-09-03 LAB — COMPREHENSIVE METABOLIC PANEL
ALBUMIN: 3.7 g/dL (ref 3.5–5.0)
ALT: 16 U/L — ABNORMAL LOW (ref 17–63)
ANION GAP: 10 (ref 5–15)
AST: 23 U/L (ref 15–41)
Alkaline Phosphatase: 58 U/L (ref 38–126)
BILIRUBIN TOTAL: 0.9 mg/dL (ref 0.3–1.2)
BUN: 19 mg/dL (ref 6–20)
CHLORIDE: 99 mmol/L — AB (ref 101–111)
CO2: 24 mmol/L (ref 22–32)
Calcium: 9.2 mg/dL (ref 8.9–10.3)
Creatinine, Ser: 0.77 mg/dL (ref 0.61–1.24)
GFR calc Af Amer: >60 mL/min (ref >60)
GLUCOSE: 132 mg/dL — AB (ref 65–99)
POTASSIUM: 4.2 mmol/L (ref 3.5–5.1)
Sodium: 133 mmol/L — ABNORMAL LOW (ref 135–145)
TOTAL PROTEIN: 6.6 g/dL (ref 6.5–8.1)

## 2016-09-03 LAB — BASIC METABOLIC PANEL
Anion gap: 7 (ref 5–15)
BUN: 18 mg/dL (ref 6–20)
CO2: 26 mmol/L (ref 22–32)
Calcium: 8.9 mg/dL (ref 8.9–10.3)
Chloride: 100 mmol/L — ABNORMAL LOW (ref 101–111)
Creatinine, Ser: 0.67 mg/dL (ref 0.61–1.24)
GFR calc Af Amer: >60 mL/min (ref >60)
GFR calc non Af Amer: >60 mL/min (ref >60)
Glucose, Bld: 107 mg/dL — ABNORMAL HIGH (ref 65–99)
Potassium: 4.1 mmol/L (ref 3.5–5.1)
Sodium: 133 mmol/L — ABNORMAL LOW (ref 135–145)

## 2016-09-03 LAB — I-STAT CHEM 8, ED
BUN: 21 mg/dL — AB (ref 6–20)
CALCIUM ION: 1.2 mmol/L (ref 1.15–1.40)
Chloride: 98 mmol/L — ABNORMAL LOW (ref 101–111)
Creatinine, Ser: 0.8 mg/dL (ref 0.61–1.24)
Glucose, Bld: 131 mg/dL — ABNORMAL HIGH (ref 65–99)
HEMATOCRIT: 36 % — AB (ref 39.0–52.0)
Hemoglobin: 12.2 g/dL — ABNORMAL LOW (ref 13.0–17.0)
Potassium: 4.2 mmol/L (ref 3.5–5.1)
SODIUM: 134 mmol/L — AB (ref 135–145)
TCO2: 26 mmol/L (ref 0–100)

## 2016-09-03 LAB — CBC WITH DIFFERENTIAL/PLATELET
Basophils Absolute: 0 10*3/uL (ref 0.0–0.1)
Basophils Relative: 0 %
Eosinophils Absolute: 0.2 10*3/uL (ref 0.0–0.7)
Eosinophils Relative: 2 %
HCT: 33.9 % — ABNORMAL LOW (ref 39.0–52.0)
Hemoglobin: 11.1 g/dL — ABNORMAL LOW (ref 13.0–17.0)
Lymphocytes Relative: 30 %
Lymphs Abs: 2.4 10*3/uL (ref 0.7–4.0)
MCH: 28.8 pg (ref 26.0–34.0)
MCHC: 32.7 g/dL (ref 30.0–36.0)
MCV: 88.1 fL (ref 78.0–100.0)
Monocytes Absolute: 0.9 10*3/uL (ref 0.1–1.0)
Monocytes Relative: 11 %
Neutro Abs: 4.6 10*3/uL (ref 1.7–7.7)
Neutrophils Relative %: 57 %
Platelets: 266 10*3/uL (ref 150–400)
RBC: 3.85 MIL/uL — ABNORMAL LOW (ref 4.22–5.81)
RDW: 14 % (ref 11.5–15.5)
WBC: 8.1 10*3/uL (ref 4.0–10.5)

## 2016-09-03 LAB — CBC
HCT: 35.9 % — ABNORMAL LOW (ref 39.0–52.0)
Hemoglobin: 11.6 g/dL — ABNORMAL LOW (ref 13.0–17.0)
MCH: 28.5 pg (ref 26.0–34.0)
MCHC: 32.3 g/dL (ref 30.0–36.0)
MCV: 88.2 fL (ref 78.0–100.0)
Platelets: 277 10*3/uL (ref 150–400)
RBC: 4.07 MIL/uL — ABNORMAL LOW (ref 4.22–5.81)
RDW: 14 % (ref 11.5–15.5)
WBC: 8.3 10*3/uL (ref 4.0–10.5)

## 2016-09-03 LAB — DIFFERENTIAL
BASOS ABS: 0 10*3/uL (ref 0.0–0.1)
BASOS PCT: 0 %
EOS ABS: 0.2 10*3/uL (ref 0.0–0.7)
EOS PCT: 2 %
Lymphocytes Relative: 22 %
Lymphs Abs: 1.8 10*3/uL (ref 0.7–4.0)
Monocytes Absolute: 0.8 10*3/uL (ref 0.1–1.0)
Monocytes Relative: 10 %
NEUTROS PCT: 66 %
Neutro Abs: 5.6 10*3/uL (ref 1.7–7.7)

## 2016-09-03 LAB — APTT: APTT: 33 s (ref 24–36)

## 2016-09-03 LAB — GLUCOSE, CAPILLARY: Glucose-Capillary: 95 mg/dL (ref 65–99)

## 2016-09-03 LAB — PROTIME-INR
INR: 1.02
PROTHROMBIN TIME: 13.4 s (ref 11.4–15.2)

## 2016-09-03 LAB — I-STAT TROPONIN, ED: TROPONIN I, POC: 0 ng/mL (ref 0.00–0.08)

## 2016-09-03 LAB — CBG MONITORING, ED: GLUCOSE-CAPILLARY: 97 mg/dL (ref 65–99)

## 2016-09-03 MED ORDER — GABAPENTIN 300 MG PO CAPS
600.0000 mg | ORAL_CAPSULE | Freq: Three times a day (TID) | ORAL | Status: DC
  Administered 2016-09-04 – 2016-09-09 (×17): 600 mg via ORAL
  Filled 2016-09-03 (×17): qty 2

## 2016-09-03 MED ORDER — DORZOLAMIDE HCL 2 % OP SOLN
1.0000 [drp] | Freq: Two times a day (BID) | OPHTHALMIC | Status: DC
  Administered 2016-09-04 – 2016-09-09 (×8): 1 [drp] via OPHTHALMIC

## 2016-09-03 MED ORDER — NITROGLYCERIN 0.4 MG SL SUBL: 0.4000 mg | SUBLINGUAL_TABLET | SUBLINGUAL | Status: DC | PRN

## 2016-09-03 MED ORDER — SODIUM CHLORIDE 0.9 % IV SOLN: 250.0000 mL | INTRAVENOUS | Status: DC | PRN

## 2016-09-03 MED ORDER — LATANOPROST 0.005 % OP SOLN
1.0000 [drp] | Freq: Every day | OPHTHALMIC | Status: DC
  Administered 2016-09-04 – 2016-09-08 (×6): 1 [drp] via OPHTHALMIC
  Filled 2016-09-03: qty 2.5

## 2016-09-03 MED ORDER — LORAZEPAM 2 MG/ML IJ SOLN
1.0000 mg | Freq: Once | INTRAMUSCULAR | Status: AC
  Administered 2016-09-03: 1 mg via INTRAVENOUS
  Filled 2016-09-03: qty 1

## 2016-09-03 MED ORDER — SODIUM CHLORIDE 0.9% FLUSH
3.0000 mL | Freq: Two times a day (BID) | INTRAVENOUS | Status: DC
  Administered 2016-09-04 – 2016-09-08 (×5): 3 mL via INTRAVENOUS

## 2016-09-03 MED ORDER — ONDANSETRON HCL 4 MG/2ML IJ SOLN
4.0000 mg | Freq: Four times a day (QID) | INTRAMUSCULAR | Status: DC | PRN
Start: 2016-09-03 — End: 2016-09-09

## 2016-09-03 MED ORDER — TAMSULOSIN HCL 0.4 MG PO CAPS
0.4000 mg | ORAL_CAPSULE | Freq: Every day | ORAL | Status: DC
  Administered 2016-09-04 – 2016-09-08 (×6): 0.4 mg via ORAL
  Filled 2016-09-03 (×6): qty 1

## 2016-09-03 MED ORDER — SODIUM CHLORIDE 0.9% FLUSH
3.0000 mL | INTRAVENOUS | Status: DC | PRN
Start: 2016-09-03 — End: 2016-09-09

## 2016-09-03 MED ORDER — ACETAMINOPHEN 325 MG PO TABS
650.0000 mg | ORAL_TABLET | ORAL | Status: DC | PRN
  Administered 2016-09-07 – 2016-09-08 (×2): 650 mg via ORAL
  Filled 2016-09-03 (×2): qty 2

## 2016-09-03 NOTE — Assessment & Plan Note (Signed)
CABG in 2007- [facility]

## 2016-09-03 NOTE — Patient Instructions (Signed)
PLEASE HEAD OVER THE THE EMERGENCY ROOM TO BE ADMITTED

## 2016-09-03 NOTE — ED Triage Notes (Signed)
Pt has had several episodes of aloc the last week he was seen here and sent back home, saw his pcp yesterday  And saw the dr again today, the dr Ree Shay may be having TIAs

## 2016-09-03 NOTE — ED Notes (Signed)
Attempted report x1. 

## 2016-09-03 NOTE — H&P (Signed)
09/03/2016 Thomas Hanna   02-24-1924  IH:[id number]  Primary Physician Thomas Reeve, DO Primary Cardiologist: Dr Claiborne Billings  HPI:  83 y/o male, recently moved here from [place] with his son and daughter in law. The pt has a history of CAD, s/p CABG in 2007, s/p PCI in 2013 and 2014 in [place] (Dr Augusto Garbe). He thinks he had a cath since then but did not receive a stent. He is very active, he drives to [place] and visits friends, shops, and in general is not limited. 2-3 weeks ago he had a URI and went to an Urgent Care. He was placed on a Z-pack and improved but last week was found slumped over in a chair at his computer. He was admitted and felt to be a little dehydrated- BUN 32. He was hydrated and improved. Troponin's negative. Echo showed normal LVF. Over the weekend he had two more episodes, he becomes disoriented and weak. His son checks his B/P and its been 80 systolic. This improves when he is lays flat. His PCP saw him 09/02/16 and placed him on Losartan 254 mg for HTN (systolic B/P in her office 170). Today he came to our office and become orthostatic in the waiting room and he was added on to to my schedule.We initially were going to try and treat this as an OP but In the exam room he had an episode where he had garble speech and became confused after sitting up for 10-15 minutes. This improved when he was supine.    Current Outpatient Prescriptions  Medication Sig Dispense Refill  . aspirin EC 81 MG tablet Take 81 mg by mouth daily.    . cholecalciferol (VITAMIN D) 1000 units tablet Take 1,000 Units by mouth daily.    . clopidogrel (PLAVIX) 75 MG tablet Take 75 mg by mouth daily.    . dorzolamide (TRUSOPT) 2 % ophthalmic solution Place 1 drop into both eyes 2 (two) times daily.    Marland Kitchen gabapentin (NEURONTIN) 100 MG capsule Take 600 mg by mouth 3 (three) times daily.     Marland Kitchen latanoprost (XALATAN) 0.005 % ophthalmic solution Place 1 drop into both eyes at bedtime.     Marland Kitchen losartan  (COZAAR) 25 MG tablet Take 1 tablet (25 mg total) by mouth daily. 30 tablet 1  . tamsulosin (FLOMAX) 0.4 MG CAPS capsule Take 0.4 mg by mouth.    . vitamin B-12 (CYANOCOBALAMIN) 100 MCG tablet Take 100 mcg by mouth daily.     No current facility-administered medications for this visit.    Past Medical History:  Diagnosis Date  . Coronary artery disease   . MI (myocardial infarction)   . Prostate cancer ([place])     No Known Allergies  Social History   Social History  . Marital status: Widowed    Spouse name: N/A  . Number of children: N/A  . Years of education: N/A   Occupational History  . Not on file.   Social History Main Topics  . Smoking status: Former Smoker    Quit date: 1970  . Smokeless tobacco: Never Used  . Alcohol use No  . Drug use: Unknown  . Sexual activity: Not on file   Other Topics Concern  . Not on file   Social History Narrative  . No narrative on file    Family History  Problem Relation Age of Onset  . Diabetes Mother   . Uterine cancer Sister    Review of Systems: General: negative for  chills, fever, night sweats or weight changes.  Cardiovascular: negative for chest pain, dyspnea on exertion, edema, orthopnea, palpitations, paroxysmal nocturnal dyspnea or shortness of breath Dermatological: negative for rash Respiratory: negative for cough or wheezing Urologic: negative for hematuria Abdominal: negative for nausea, vomiting, diarrhea, bright red blood per rectum, melena, or hematemesis Neurologic: negative for visual changes, syncope, or dizziness All other systems reviewed and are otherwise negative except as noted above.    Blood pressure (!) 151/62, pulse (!) 52, height 5' 11.5" (1.816 m), weight 169 lb (76.7 kg).  General appearance: alert, cooperative and no distress Neck: no carotid bruit and no JVD Lungs: clear to auscultation bilaterally Heart: regular rate and rhythm Abdomen: soft, non-tender; bowel sounds normal; no masses,   no organomegaly Extremities: extremities normal, atraumatic, no cyanosis or edema Pulses: 2+ and symmetric Skin: Skin color, texture, turgor normal. No rashes or lesions Neurologic: Grossly normal  EKG NSR 61  ASSESSMENT AND PLAN:   Orthostatic hypotension Pt is having recurrent symptomatic orthostatic hypotension  CAD S/P percutaneous coronary angioplasty  s/p PCI in 2013 and 2014 (Dr Wyline Mood [facility]) No recent angina  [facility] [place]  Dyslipidemia Statin intolerant   PLAN  Thomas Hanna appears to have significant orthostatic hypotension with evidence of cerebral ischemia. His preliminary carotid dopplers did not show significant disease. His head CT in the hospital was unremarkable. After discussion with Dr Claiborne Billings who saw the pt with me he will be admitted and monitored. We'll obtain a neuro consult as well.   Kerin Ransom PA-C 09/03/2016 11:48 AM  Patient seen and examined. Agree with assessment and plan.  Thomas Hanna is a 83 year old Caucasian male who is status post prior CABG revascularization surgery in 2007 in [place] and underwent PTCA (question vessel) in 2013.  He reportedly has had a subsequent catheterization for which medical therapy was recommended.  He has recently moved to the [place] area to be with his son.  He was in the hospital last week with significant orthostatic hypotension which was felt possibly due to dehydration.  A head CT did not reveal any acute abnormality, but was positive for small vessel disease.  An echo Doppler study revealed normal LV function.  Since his hospital discharge, he has had recurrent presyncopal spells and according to his family members, his systolic blood pressure has dropped from 160 supine to 80 standing on several occasions.  In the office today his blood pressure was 0000000 systolic, which dropped to 113, while standing.  In the office, he had another episode associated with garbled  speech and confusion after he had been sitting for 10-15 minutes, which responded to supine posture.  With his recurrent symptomatology, he will be admitted to the hospital for further evaluation.  Neurologic evaluation will be done.  Recent carotid studies did not reveal any high-grade carotid disease.  He may require an MRI for further evaluation.  Presently, we will initiate support stockings, and hold his ARB therapy, which was just implemented by his primary physician.  He denies associated chest pain.  His ECG reveals sinus rhythm.  Further recommendations will be forthcoming upon further evaluation and laboratory assessment.   Troy Sine, MD, [facility] 09/03/2016 6:50 PM

## 2016-09-03 NOTE — ED Notes (Signed)
Pt transported to MRI 

## 2016-09-03 NOTE — ED Provider Notes (Signed)
[facility] DEPT Provider Note  CSN: [id number] Arrival date & time: 09/03/16  1310  History   Chief Complaint Chief Complaint  Patient presents with  . Altered Mental Status   HPI Thomas Hanna is a 83 y.o. male.  The history is provided by the patient, a relative and medical records. No language interpreter was used.  Illness  This is a new problem. The current episode started more than 2 days ago. The problem occurs daily. Progression since onset: Waxing and waning. Pertinent negatives include no chest pain and no shortness of breath. The symptoms are aggravated by walking and exertion. The symptoms are relieved by rest and position.    Past Medical History:  Diagnosis Date  . Coronary artery disease   . MI (myocardial infarction)   . Prostate cancer [facility])    Patient Active Problem List   Diagnosis Date Noted  . Orthostatic hypotension 09/03/2016  . CAD S/P percutaneous coronary angioplasty 09/03/2016  . Hx of CABG 09/03/2016  . Dyslipidemia 09/03/2016  . Coronary artery disease due to lipid rich plaque 08/29/2016  . Hyponatremia 08/29/2016  . Elevated LFTs 08/29/2016  . Normocytic anemia 08/29/2016  . Syncope 08/28/2016   Past Surgical History:  Procedure Laterality Date  . cardiac stents    . CARDIAC SURGERY    . CORONARY ARTERY BYPASS GRAFT      Home Medications    Prior to Admission medications   Medication Sig Start Date End Date Taking? Authorizing Provider  aspirin EC 81 MG tablet Take 81 mg by mouth daily.   Yes Historical Provider, MD  cholecalciferol (VITAMIN D) 1000 units tablet Take 1,000 Units by mouth daily.   Yes Historical Provider, MD  clopidogrel (PLAVIX) 75 MG tablet Take 75 mg by mouth daily.   Yes Historical Provider, MD  dorzolamide (TRUSOPT) 2 % ophthalmic solution Place 1 drop into both eyes 2 (two) times daily.   Yes Historical Provider, MD  gabapentin (NEURONTIN) 100 MG capsule Take 600 mg by mouth 3 (three) times daily.    Yes  Historical Provider, MD  latanoprost (XALATAN) 0.005 % ophthalmic solution Place 1 drop into both eyes at bedtime.    Yes Historical Provider, MD  losartan (COZAAR) 25 MG tablet Take 1 tablet (25 mg total) by mouth daily. 09/02/16  Yes Emeterio Reeve, DO  tamsulosin (FLOMAX) 0.4 MG CAPS capsule Take 0.4 mg by mouth.   Yes Historical Provider, MD  vitamin B-12 (CYANOCOBALAMIN) 100 MCG tablet Take 100 mcg by mouth daily.   Yes Historical Provider, MD   Family History Family History  Problem Relation Age of Onset  . Diabetes Mother   . Uterine cancer Sister    Social History Social History  Substance Use Topics  . Smoking status: Former Smoker    Quit date: 1970  . Smokeless tobacco: Never Used  . Alcohol use No    Allergies   Sulfa antibiotics  Review of Systems Review of Systems  Respiratory: Negative for shortness of breath.   Cardiovascular: Negative for chest pain.  Neurological: Positive for syncope and speech difficulty.  All other systems reviewed and are negative.   Physical Exam Updated Vital Signs BP 162/65   Pulse (!) 51   Temp 97.7 F (36.5 C) (Oral)   Resp 18   SpO2 100%   Physical Exam  Constitutional: He is oriented to person, place, and time. No distress.  Elderly overweight Caucasian male  HENT:  Head: Normocephalic and atraumatic.  Eyes: EOM are normal.  Pupils are equal, round, and reactive to light.  Neck: Normal range of motion. Neck supple.  Cardiovascular: Normal rate, regular rhythm and normal heart sounds.   Pulmonary/Chest: Effort normal and breath sounds normal.  Abdominal: Soft. Bowel sounds are normal. He exhibits no distension. There is no tenderness.  Musculoskeletal: Normal range of motion.  Neurological: He is alert and oriented to person, place, and time.  Initially confused with dysarthric speech but quickly improved while in room, following commands to all ext, no visual deficits  Skin: Skin is warm and dry. Capillary refill  takes less than 2 seconds. He is not diaphoretic.  Nursing note and vitals reviewed.   ED Treatments / Results  Labs (all labs ordered are listed, but only abnormal results are displayed) Labs Reviewed  CBC - Abnormal; Notable for the following:       Result Value   RBC 4.07 (*)    Hemoglobin 11.6 (*)    HCT 35.9 (*)    All other components within normal limits  COMPREHENSIVE METABOLIC PANEL - Abnormal; Notable for the following:    Sodium 133 (*)    Chloride 99 (*)    Glucose, Bld 132 (*)    ALT 16 (*)    All other components within normal limits  I-STAT CHEM 8, ED - Abnormal; Notable for the following:    Sodium 134 (*)    Chloride 98 (*)    BUN 21 (*)    Glucose, Bld 131 (*)    Hemoglobin 12.2 (*)    HCT 36.0 (*)    All other components within normal limits  PROTIME-INR  APTT  DIFFERENTIAL  I-STAT TROPOININ, ED  CBG MONITORING, ED   EKG  EKG Interpretation  Date/Time:  Tuesday September 03 2016 14:34:02 EST Ventricular Rate:  53 PR Interval:    QRS Duration: 97 QT Interval:  470 QTC Calculation: 442 R Axis:   78 Text Interpretation:  Sinus rhythm Nonspecific T abnormalities, lateral leads No acute changes No significant change since last tracing Confirmed by Kathrynn Humble, MD, Thelma Comp [phone]) on 09/03/2016 3:16:38 PM      Radiology Dg Chest 2 View  Result Date: 09/03/2016 CLINICAL DATA:  Syncopal episode. Orthostatics hypotension. Lethargy. History of coronary artery disease, previous MI, stent placement, and CABG. EXAM: CHEST  2 VIEW COMPARISON:  PA and lateral chest x-ray of August 28, 2016 FINDINGS: The lungs remain hyperinflated. There is no focal infiltrate. There is no pleural effusion, pneumothorax, or pneumomediastinum. The heart and pulmonary vascularity are normal. There is calcification in the wall of the thoracic aorta. There are post CABG changes. The sternal wires are intact. The bony thorax exhibits no acute abnormality. IMPRESSION: COPD. Previous CABG. No  pneumonia, CHF, nor other acute cardiopulmonary abnormality. Thoracic aortic atherosclerosis. Electronically Signed   By: David  Martinique M.D.   On: 09/03/2016 14:26   Ct Head Wo Contrast  Result Date: 09/03/2016 CLINICAL DATA:  Dizziness, altered mental status, possible TIAs EXAM: CT HEAD WITHOUT CONTRAST TECHNIQUE: Contiguous axial images were obtained from the base of the skull through the vertex without intravenous contrast. COMPARISON:  08/28/2016 FINDINGS: Brain: No evidence of acute infarction, hemorrhage, hydrocephalus, extra-axial collection or mass lesion/mass effect. Subcortical white matter and periventricular small vessel ischemic changes. Mild cortical and central atrophy. Secondary ventricular prominence. Vascular: Intracranial atherosclerosis. Skull: Normal. Negative for fracture or focal lesion. Sinuses/Orbits: The visualized paranasal sinuses are essentially clear. The mastoid air cells are unopacified. Other: None. IMPRESSION: No evidence of acute intracranial  abnormality. Atrophy with small vessel ischemic changes. No interval change from recent CT. Electronically Signed   By: Julian Hy M.D.   On: 09/03/2016 14:14   Procedures Procedures (including critical care time)  Medications Ordered in ED Medications  dorzolamide (TRUSOPT) 2 % ophthalmic solution 1 drop (not administered)  gabapentin (NEURONTIN) capsule 600 mg (not administered)  latanoprost (XALATAN) 0.005 % ophthalmic solution 1 drop (not administered)     Initial Impression / Assessment and Plan / ED Course  I have reviewed the triage vital signs and the nursing notes.  83 y.o. male with above stated PMHx, HPI, and physical. PMHx of CAD (CABG, multiple stents). Symptoms over past week. Intermittent syncopal episodes. Occurring x3-4 times per day. Usually upon standing. Followed by 5-10 minutes of decreased responsiveness with slow improvement in mental status. No stiffening, shaking, tongue biting, or  incontinence. No facial droop but garble speech shortly after episodes.  Dx for intermittent dysrhythmia vs atypical seizures vs recurrent TIA's vs metabolic derangement. EKG showing sinus bradycardia with HR in 50's but no ST elevation/depression or T wave inversions. Troponin undetectable. CXR showing thoracic atherosclerosis but nothing else acute. Ct head showing NAICA. MRI brain ordered.  Laboratory and imaging results were personally reviewed by myself and used in the medical decision making of this patient's treatment and disposition.  Cards consulted for intermittent dysrhythmia with stronger concern for transient AV block. Pt admitted to cardiology for further evaluation and management of above. Pt understands and agrees with the plan and has no further questions or concerns.   Pt care discussed with and followed by my attending, Dr. Carmin Muskrat  Mayer Camel, MD Pager [phone]  Final Clinical Impressions(s) / ED Diagnoses   Final diagnoses:  Altered mental status  Syncope   New Prescriptions New Prescriptions   No medications on file     Mayer Camel, MD 09/03/16 [place], MD 09/04/16 1734

## 2016-09-03 NOTE — Assessment & Plan Note (Signed)
Statin intolerant 

## 2016-09-03 NOTE — Assessment & Plan Note (Signed)
Pt is having recurrent symptomatic orthostatic hypotension

## 2016-09-03 NOTE — Consult Note (Signed)
Admission H&P    Chief Complaint: Orthostasis and abnormal MRI with probable subacute thalamic stroke.  HPI: Thomas Hanna is an 83 y.o. male with a history of myocardial infarction, coronary artery disease and prostate cancer, admitted for evaluation and management of recurrent episodes of hypotension and loss of consciousness or near loss of consciousness. Symptoms are precipitated by sitting or standing, and relieved by lying supine. Neurology consultation was obtained because of abnormality on MRI indicating probable subacute left thalamic infarction. Exact onset is unclear. He has had episodes of garbled speech and confusion associated with hypotension and orthostasis, which have essentially cleared after lying down. No facial droop is been noted. He said no weakness nor numbness involving right extremities. He takes Plavix 75 mg per day.  LSN: 08/28/2016 tPA Given: No: Unclear onset of thalamic stroke; no focal deficits. mRankin:  Past Medical History:  Diagnosis Date  . Coronary artery disease   . MI (myocardial infarction)   . Prostate cancer [facility])     Past Surgical History:  Procedure Laterality Date  . cardiac stents    . CARDIAC SURGERY    . CORONARY ARTERY BYPASS GRAFT      Family History  Problem Relation Age of Onset  . Diabetes Mother   . Uterine cancer Sister    Social History:  reports that he quit smoking about 48 years ago. He has never used smokeless tobacco. He reports that he does not drink alcohol. His drug history is not on file.  Allergies:  Allergies  Allergen Reactions  . Sulfa Antibiotics Other (See Comments)    Unknown    Medications: Preadmission medications were reviewed by me.  ROS: General: negative for chills, fever, night sweats or weight changes.  Cardiovascular: negative for chest pain, dyspnea on exertion, edema, orthopnea, palpitations, paroxysmal nocturnal dyspnea or shortness of breath Dermatological: negative for  rash Respiratory: negative for cough or wheezing Urologic: negative for hematuria Abdominal: negative for nausea, vomiting, diarrhea, bright red blood per rectum, melena, or hematemesis Neurologic: negative for visual changes, syncope, or dizziness All other systems reviewed and are otherwise negative except as noted above  Physical Examination: Blood pressure 162/56, pulse 88, temperature 97.7 F (36.5 C), resp. rate 17, SpO2 96 %.  HEENT-  Normocephalic, no lesions, without obvious abnormality.  Normal external eye and conjunctiva.  Normal TM's bilaterally.  Normal auditory canals and external ears. Normal external nose, mucus membranes and septum.  Normal pharynx. Neck supple with no masses, nodes, nodules or enlargement. Cardiovascular - regular rate and rhythm, S1, S2 normal, no murmur, click, rub or gallop Lungs - chest clear, no wheezing, rales, normal symmetric air entry Abdomen - soft, non-tender; bowel sounds normal; no masses,  no organomegaly Extremities - no joint deformities, effusion, or inflammation  Neurologic Examination: Mental Status: Alert, oriented to current age but disoriented to current month (was given Ativan 1 MRI study), no acute distress.  Speech fluent without evidence of aphasia. Able to follow commands without difficulty. Cranial Nerves: II-Visual fields were normal. III/IV/VI-Pupils were equal and reacted normally to light. Extraocular movements were full and conjugate.    V/VII-no facial numbness and no facial weakness. VIII-normal. X-normal speech and symmetrical palatal movement. Motor: 5/5 bilaterally with normal tone and bulk Sensory: Normal throughout except for absent vibratory sensation and position sense in distal lower extremities. Deep Tendon Reflexes: 1+ and symmetric in upper extremities and absent in lower extremities. Plantars: Flexor bilaterally Cerebellar: Normal finger-to-nose testing septal mild intention tremor bilaterally. Carotid  auscultation: Normal  Results for orders placed or performed during the hospital encounter of 09/03/16 (from the past 48 hour(s))  Protime-INR     Status: None   Collection Time: 09/03/16  1:37 PM  Result Value Ref Range   Prothrombin Time 13.4 11.4 - 15.2 seconds   INR 1.02   APTT     Status: None   Collection Time: 09/03/16  1:37 PM  Result Value Ref Range   aPTT 33 24 - 36 seconds  CBC     Status: Abnormal   Collection Time: 09/03/16  1:37 PM  Result Value Ref Range   WBC 8.3 4.0 - 10.5 K/uL   RBC 4.07 (L) 4.22 - 5.81 MIL/uL   Hemoglobin 11.6 (L) 13.0 - 17.0 g/dL   HCT 35.9 (L) 39.0 - 52.0 %   MCV 88.2 78.0 - 100.0 fL   MCH 28.5 26.0 - 34.0 pg   MCHC 32.3 30.0 - 36.0 g/dL   RDW 14.0 11.5 - 15.5 %   Platelets 277 150 - 400 K/uL  Differential     Status: None   Collection Time: 09/03/16  1:37 PM  Result Value Ref Range   Neutrophils Relative % 66 %   Neutro Abs 5.6 1.7 - 7.7 K/uL   Lymphocytes Relative 22 %   Lymphs Abs 1.8 0.7 - 4.0 K/uL   Monocytes Relative 10 %   Monocytes Absolute 0.8 0.1 - 1.0 K/uL   Eosinophils Relative 2 %   Eosinophils Absolute 0.2 0.0 - 0.7 K/uL   Basophils Relative 0 %   Basophils Absolute 0.0 0.0 - 0.1 K/uL  Comprehensive metabolic panel     Status: Abnormal   Collection Time: 09/03/16  1:37 PM  Result Value Ref Range   Sodium 133 (L) 135 - 145 mmol/L   Potassium 4.2 3.5 - 5.1 mmol/L   Chloride 99 (L) 101 - 111 mmol/L   CO2 24 22 - 32 mmol/L   Glucose, Bld 132 (H) 65 - 99 mg/dL   BUN 19 6 - 20 mg/dL   Creatinine, Ser 0.77 0.61 - 1.24 mg/dL   Calcium 9.2 8.9 - 10.3 mg/dL   Total Protein 6.6 6.5 - 8.1 g/dL   Albumin 3.7 3.5 - 5.0 g/dL   AST 23 15 - 41 U/L   ALT 16 (L) 17 - 63 U/L   Alkaline Phosphatase 58 38 - 126 U/L   Total Bilirubin 0.9 0.3 - 1.2 mg/dL   GFR calc non Af Amer >60 >60 mL/min   GFR calc Af Amer >60 >60 mL/min    Comment: (NOTE) The eGFR has been calculated using the CKD EPI equation. This calculation has not been  validated in all clinical situations. eGFR's persistently <60 mL/min signify possible Chronic Kidney Disease.    Anion gap 10 5 - 15  I-stat troponin, ED     Status: None   Collection Time: 09/03/16  2:01 PM  Result Value Ref Range   Troponin i, poc 0.00 0.00 - 0.08 ng/mL   Comment 3            Comment: Due to the release kinetics of cTnI, a negative result within the first hours of the onset of symptoms does not rule out myocardial infarction with certainty. If myocardial infarction is still suspected, repeat the test at appropriate intervals.   I-Stat Chem 8, ED     Status: Abnormal   Collection Time: 09/03/16  2:04 PM  Result Value Ref Range   Sodium  134 (L) 135 - 145 mmol/L   Potassium 4.2 3.5 - 5.1 mmol/L   Chloride 98 (L) 101 - 111 mmol/L   BUN 21 (H) 6 - 20 mg/dL   Creatinine, Ser 0.80 0.61 - 1.24 mg/dL   Glucose, Bld 131 (H) 65 - 99 mg/dL   Calcium, Ion 1.20 1.15 - 1.40 mmol/L   TCO2 26 0 - 100 mmol/L   Hemoglobin 12.2 (L) 13.0 - 17.0 g/dL   HCT 36.0 (L) 39.0 - 52.0 %  CBG monitoring, ED     Status: None   Collection Time: 09/03/16  2:36 PM  Result Value Ref Range   Glucose-Capillary 97 65 - 99 mg/dL  Basic metabolic panel     Status: Abnormal   Collection Time: 09/03/16  8:06 PM  Result Value Ref Range   Sodium 133 (L) 135 - 145 mmol/L   Potassium 4.1 3.5 - 5.1 mmol/L   Chloride 100 (L) 101 - 111 mmol/L   CO2 26 22 - 32 mmol/L   Glucose, Bld 107 (H) 65 - 99 mg/dL   BUN 18 6 - 20 mg/dL   Creatinine, Ser 0.67 0.61 - 1.24 mg/dL   Calcium 8.9 8.9 - 10.3 mg/dL   GFR calc non Af Amer >60 >60 mL/min   GFR calc Af Amer >60 >60 mL/min    Comment: (NOTE) The eGFR has been calculated using the CKD EPI equation. This calculation has not been validated in all clinical situations. eGFR's persistently <60 mL/min signify possible Chronic Kidney Disease.    Anion gap 7 5 - 15  CBC WITH DIFFERENTIAL     Status: Abnormal   Collection Time: 09/03/16  8:06 PM  Result Value  Ref Range   WBC 8.1 4.0 - 10.5 K/uL   RBC 3.85 (L) 4.22 - 5.81 MIL/uL   Hemoglobin 11.1 (L) 13.0 - 17.0 g/dL   HCT 33.9 (L) 39.0 - 52.0 %   MCV 88.1 78.0 - 100.0 fL   MCH 28.8 26.0 - 34.0 pg   MCHC 32.7 30.0 - 36.0 g/dL   RDW 14.0 11.5 - 15.5 %   Platelets 266 150 - 400 K/uL   Neutrophils Relative % 57 %   Lymphocytes Relative 30 %   Monocytes Relative 11 %   Eosinophils Relative 2 %   Basophils Relative 0 %   Neutro Abs 4.6 1.7 - 7.7 K/uL   Lymphs Abs 2.4 0.7 - 4.0 K/uL   Monocytes Absolute 0.9 0.1 - 1.0 K/uL   Eosinophils Absolute 0.2 0.0 - 0.7 K/uL   Basophils Absolute 0.0 0.0 - 0.1 K/uL   Dg Chest 2 View  Result Date: 09/03/2016 CLINICAL DATA:  Syncopal episode. Orthostatics hypotension. Lethargy. History of coronary artery disease, previous MI, stent placement, and CABG. EXAM: CHEST  2 VIEW COMPARISON:  PA and lateral chest x-ray of August 28, 2016 FINDINGS: The lungs remain hyperinflated. There is no focal infiltrate. There is no pleural effusion, pneumothorax, or pneumomediastinum. The heart and pulmonary vascularity are normal. There is calcification in the wall of the thoracic aorta. There are post CABG changes. The sternal wires are intact. The bony thorax exhibits no acute abnormality. IMPRESSION: COPD. Previous CABG. No pneumonia, CHF, nor other acute cardiopulmonary abnormality. Thoracic aortic atherosclerosis. Electronically Signed   By: David  Martinique M.D.   On: 09/03/2016 14:26   Ct Head Wo Contrast  Result Date: 09/03/2016 CLINICAL DATA:  Dizziness, altered mental status, possible TIAs EXAM: CT HEAD WITHOUT CONTRAST TECHNIQUE: Contiguous axial images  were obtained from the base of the skull through the vertex without intravenous contrast. COMPARISON:  08/28/2016 FINDINGS: Brain: No evidence of acute infarction, hemorrhage, hydrocephalus, extra-axial collection or mass lesion/mass effect. Subcortical white matter and periventricular small vessel ischemic changes. Mild  cortical and central atrophy. Secondary ventricular prominence. Vascular: Intracranial atherosclerosis. Skull: Normal. Negative for fracture or focal lesion. Sinuses/Orbits: The visualized paranasal sinuses are essentially clear. The mastoid air cells are unopacified. Other: None. IMPRESSION: No evidence of acute intracranial abnormality. Atrophy with small vessel ischemic changes. No interval change from recent CT. Electronically Signed   By: Julian Hy M.D.   On: 09/03/2016 14:14   [name]  Result Date: 09/03/2016 CLINICAL DATA:  Found unresponsive at desk. Dehydrated. Recent upper respiratory tract infection. Now disoriented, week with garbled speech and confusion. Symptoms improved when supine. History of prostate cancer. EXAM: MRI HEAD WITHOUT CONTRAST TECHNIQUE: Multiplanar, multiecho pulse sequences of the brain and surrounding structures were obtained without intravenous contrast. COMPARISON:  CT HEAD September 03, 2016 at 1401 hours FINDINGS: BRAIN: Faint reduced diffusion associated with the T2 bright 14 mm lesion LEFT mesial thalamus, normalized ADC values. No mass effect. Ventricles and sulci are normal for patient's age. Patchy to confluent supratentorial white matter FLAIR T2 hyperintensities. No susceptibility artifact to suggest hemorrhage. No midline shift. No abnormal extra-axial fluid collections. VASCULAR: Normal major intracranial vascular flow voids present at skull base. SKULL AND UPPER CERVICAL SPINE: No abnormal sellar expansion. No suspicious calvarial bone marrow signal. Craniocervical junction maintained. SINUSES/ORBITS: Trace paranasal sinus mucosal thickening. Mastoid air cells are well aerated. Status post bilateral ocular lens implants. The included ocular globes and orbital contents are non-suspicious. OTHER: None. IMPRESSION: 14 mm LEFT thalamus subacute infarct, less likely mass. Recommend 6-8 week follow-up MRI of the head with contrast. Moderate chronic  small vessel ischemic disease. Please note for assessment of intracranial hypotension, contrast enhanced sequences would be indicated as well as correlation with lumbar puncture opening pressures. Electronically Signed   By: Elon Alas M.D.   On: 09/03/2016 19:57    Assessment: 83 y.o. male with a history of artery disease and myocardial infarction admitted for management of orthostasis and syncope or near syncope, with MRI of the brain showing probable subacute left thalamic infarction. Time of onset is unclear. Patient appears to have double for neuropathy as well, which may be a contributor to his orthostasis with possible autonomic involvement.  Stroke Risk Factors - none  Plan: 1. HgbA1c, fasting lipid panel 2. MRA  of the brain without contrast 3. PT consult, OT consult, Speech consult 4. Carotid doppler if not recently performed 5. Prophylactic therapy-Antiplatelet med: Plavix  6. Risk factor modification 7. Telemetry monitoring 8. Vitamin B 12 level 9. Repeat MRI of brain with contrast, as recommended by radiologist  C.R. Nicole Kindred, MD [facility] [phone]  09/03/2016, 9:23 PM

## 2016-09-03 NOTE — ED Notes (Signed)
Attempted report x 2 

## 2016-09-03 NOTE — Assessment & Plan Note (Signed)
s/p PCI in 2013 and 2014 (Dr Eliseo Squires [facility]) No recent angina

## 2016-09-03 NOTE — Progress Notes (Signed)
09/03/2016 Thomas Hanna   1923/08/27  [id number]  Primary Physician Emeterio Reeve, DO Primary Cardiologist: Dr Claiborne Billings  HPI:  83 y/o male, recently moved here from [place] with his son and daughter in law. The pt has a history of CAD, s/p CABG in 2007, s/p PCI in 2013 and 2014 in [place] (Dr Augusto Garbe). He thinks he had a cath since then but did not receive a stent. He is very active, he drives to [place] and visits friends, shops, and in general is not limited. 2-3 weeks ago he had a URI and went to an Urgent Care. He was placed on a Z-pack and improved but last week was found slumped over in a chair at his computer. He was admitted and felt to be a little dehydrated- BUN 32. He was hydrated and improved. Troponin's negative. Echo showed normal LVF. Over the weekend he had two more episodes, he becomes disoriented and weak. His son checks his B/P and its been 80 systolic. This improves when he is lays flat. His PCP saw him 09/02/16 and placed him on Losartan 254 mg for HTN (systolic B/P in her office 170). Today he came to our office and become orthostatic in the waiting room and he was added on to to my schedule.We initially were going to try and treat this as an OP but In the exam room he had an episode where he had garble speech and became confused after sitting up for 10-15 minutes. This improved when he was supine.    Current Outpatient Prescriptions  Medication Sig Dispense Refill  . aspirin EC 81 MG tablet Take 81 mg by mouth daily.    . cholecalciferol (VITAMIN D) 1000 units tablet Take 1,000 Units by mouth daily.    . clopidogrel (PLAVIX) 75 MG tablet Take 75 mg by mouth daily.    . dorzolamide (TRUSOPT) 2 % ophthalmic solution Place 1 drop into both eyes 2 (two) times daily.    Marland Kitchen gabapentin (NEURONTIN) 100 MG capsule Take 600 mg by mouth 3 (three) times daily.     Marland Kitchen latanoprost (XALATAN) 0.005 % ophthalmic solution Place 1 drop into both eyes at bedtime.     Marland Kitchen losartan  (COZAAR) 25 MG tablet Take 1 tablet (25 mg total) by mouth daily. 30 tablet 1  . tamsulosin (FLOMAX) 0.4 MG CAPS capsule Take 0.4 mg by mouth.    . vitamin B-12 (CYANOCOBALAMIN) 100 MCG tablet Take 100 mcg by mouth daily.     No current facility-administered medications for this visit.    Past Medical History:  Diagnosis Date  . Coronary artery disease   . MI (myocardial infarction)   . Prostate cancer ([place])     No Known Allergies  Social History   Social History  . Marital status: Widowed    Spouse name: N/A  . Number of children: N/A  . Years of education: N/A   Occupational History  . Not on file.   Social History Main Topics  . Smoking status: Former Smoker    Quit date: 1970  . Smokeless tobacco: Never Used  . Alcohol use No  . Drug use: Unknown  . Sexual activity: Not on file   Other Topics Concern  . Not on file   Social History Narrative  . No narrative on file    Family History  Problem Relation Age of Onset  . Diabetes Mother   . Uterine cancer Sister    Review of Systems: General: negative  for chills, fever, night sweats or weight changes.  Cardiovascular: negative for chest pain, dyspnea on exertion, edema, orthopnea, palpitations, paroxysmal nocturnal dyspnea or shortness of breath Dermatological: negative for rash Respiratory: negative for cough or wheezing Urologic: negative for hematuria Abdominal: negative for nausea, vomiting, diarrhea, bright red blood per rectum, melena, or hematemesis Neurologic: negative for visual changes, syncope, or dizziness All other systems reviewed and are otherwise negative except as noted above.    Blood pressure (!) 151/62, pulse (!) 52, height 5' 11.5" (1.816 m), weight 169 lb (76.7 kg).  General appearance: alert, cooperative and no distress Neck: no carotid bruit and no JVD Lungs: clear to auscultation bilaterally Heart: regular rate and rhythm Abdomen: soft, non-tender; bowel sounds normal; no masses,   no organomegaly Extremities: extremities normal, atraumatic, no cyanosis or edema Pulses: 2+ and symmetric Skin: Skin color, texture, turgor normal. No rashes or lesions Neurologic: Grossly normal  EKG NSR 61  ASSESSMENT AND PLAN:   Orthostatic hypotension Pt is having recurrent symptomatic orthostatic hypotension  CAD S/P percutaneous coronary angioplasty  s/p PCI in 2013 and 2014 (Dr Wyline Mood [facility]) No recent angina  [facility] [place]  Dyslipidemia Statin intolerant   PLAN  Mr [name] appears to have significant orthostatic hypotension with evidence of cerebral ischemia. His preliminary carotid dopplers did not show significant disease. His head CT in the hospital was unremarkable. After discussion with Dr Claiborne Billings who saw the pt with me he will be admitted and monitored. We'll obtain a neuro consult as well.   Kerin Ransom PA-C 09/03/2016 11:48 AM

## 2016-09-04 ENCOUNTER — Observation Stay (HOSPITAL_COMMUNITY): Payer: Medicare Other

## 2016-09-04 DIAGNOSIS — I10 Essential (primary) hypertension: Secondary | ICD-10-CM | POA: Diagnosis present

## 2016-09-04 DIAGNOSIS — I639 Cerebral infarction, unspecified: Secondary | ICD-10-CM | POA: Diagnosis present

## 2016-09-04 DIAGNOSIS — Z7902 Long term (current) use of antithrombotics/antiplatelets: Secondary | ICD-10-CM | POA: Diagnosis not present

## 2016-09-04 DIAGNOSIS — Z8546 Personal history of malignant neoplasm of prostate: Secondary | ICD-10-CM | POA: Diagnosis not present

## 2016-09-04 DIAGNOSIS — R739 Hyperglycemia, unspecified: Secondary | ICD-10-CM | POA: Diagnosis present

## 2016-09-04 DIAGNOSIS — I2510 Atherosclerotic heart disease of native coronary artery without angina pectoris: Secondary | ICD-10-CM | POA: Diagnosis present

## 2016-09-04 DIAGNOSIS — Z955 Presence of coronary angioplasty implant and graft: Secondary | ICD-10-CM | POA: Diagnosis not present

## 2016-09-04 DIAGNOSIS — E785 Hyperlipidemia, unspecified: Secondary | ICD-10-CM | POA: Diagnosis present

## 2016-09-04 DIAGNOSIS — E860 Dehydration: Secondary | ICD-10-CM | POA: Diagnosis present

## 2016-09-04 DIAGNOSIS — I6339 Cerebral infarction due to thrombosis of other cerebral artery: Secondary | ICD-10-CM | POA: Diagnosis not present

## 2016-09-04 DIAGNOSIS — I6523 Occlusion and stenosis of bilateral carotid arteries: Secondary | ICD-10-CM | POA: Diagnosis not present

## 2016-09-04 DIAGNOSIS — E871 Hypo-osmolality and hyponatremia: Secondary | ICD-10-CM | POA: Diagnosis present

## 2016-09-04 DIAGNOSIS — Z79899 Other long term (current) drug therapy: Secondary | ICD-10-CM | POA: Diagnosis not present

## 2016-09-04 DIAGNOSIS — Z7982 Long term (current) use of aspirin: Secondary | ICD-10-CM | POA: Diagnosis not present

## 2016-09-04 DIAGNOSIS — I252 Old myocardial infarction: Secondary | ICD-10-CM | POA: Diagnosis not present

## 2016-09-04 DIAGNOSIS — Z951 Presence of aortocoronary bypass graft: Secondary | ICD-10-CM | POA: Diagnosis not present

## 2016-09-04 DIAGNOSIS — J449 Chronic obstructive pulmonary disease, unspecified: Secondary | ICD-10-CM | POA: Diagnosis present

## 2016-09-04 DIAGNOSIS — D649 Anemia, unspecified: Secondary | ICD-10-CM | POA: Diagnosis present

## 2016-09-04 DIAGNOSIS — I951 Orthostatic hypotension: Secondary | ICD-10-CM | POA: Diagnosis not present

## 2016-09-04 DIAGNOSIS — Z87891 Personal history of nicotine dependence: Secondary | ICD-10-CM | POA: Diagnosis not present

## 2016-09-04 DIAGNOSIS — G629 Polyneuropathy, unspecified: Secondary | ICD-10-CM | POA: Diagnosis present

## 2016-09-04 LAB — TSH: TSH: 2.422 u[IU]/mL (ref 0.350–4.500)

## 2016-09-04 LAB — CORTISOL: Cortisol, Plasma: 14.8 ug/dL

## 2016-09-04 MED ORDER — IOPAMIDOL (ISOVUE-370) INJECTION 76%
INTRAVENOUS | Status: AC
Start: 2016-09-04 — End: 2016-09-04
  Administered 2016-09-04: 50 mL
  Filled 2016-09-04: qty 50

## 2016-09-04 MED ORDER — SODIUM CHLORIDE 0.45 % IV SOLN
INTRAVENOUS | Status: DC
  Administered 2016-09-04: 10:00:00 via INTRAVENOUS

## 2016-09-04 MED ORDER — CLOPIDOGREL BISULFATE 75 MG PO TABS
75.0000 mg | ORAL_TABLET | Freq: Every day | ORAL | Status: DC
  Administered 2016-09-04 – 2016-09-09 (×6): 75 mg via ORAL
  Filled 2016-09-04 (×6): qty 1

## 2016-09-04 NOTE — Progress Notes (Signed)
Patient arrived to unit 15m bed 20 from emergency department .Assisted patient to bed by nursing staff.Oriented patient to nursing unit,call bell, and fall policy.Informed patient at risk for falls and placed on fall precautions yellow arm bracelet and yellow socks placed on patient and bed alarm set for safety.

## 2016-09-04 NOTE — Progress Notes (Signed)
Progress Note  Patient Name: Thomas Hanna Date of Encounter: 09/04/2016  Primary Cardiologist: Claiborne Billings  Subjective   No complaints but has not been OOB  Inpatient Medications    Scheduled Meds: . dorzolamide  1 drop Both Eyes BID  . gabapentin  600 mg Oral TID  . latanoprost  1 drop Both Eyes QHS  . sodium chloride flush  3 mL Intravenous Q12H  . tamsulosin  0.4 mg Oral QHS   Continuous Infusions:  PRN Meds: sodium chloride, acetaminophen, nitroGLYCERIN, ondansetron (ZOFRAN) IV, sodium chloride flush   Vital Signs    Vitals:   09/04/16 0020 09/04/16 0220 09/04/16 0420 09/04/16 0620  BP: (!) 109/43 134/68 140/60 (!) 143/68  Pulse:      Resp: 18 20 20 18   Temp: 97.6 F (36.4 C) 97.4 F (36.3 C) 97.8 F (36.6 C) 97.7 F (36.5 C)  TempSrc: Oral Oral Oral Oral  SpO2: 97% 97% 97% 97%  Weight:      Height:        Intake/Output Summary (Last 24 hours) at 09/04/16 0902 Last data filed at 09/04/16 0400  Gross per 24 hour  Intake                0 ml  Output             1050 ml  Net            -1050 ml   Filed Weights   09/03/16 2220  Weight: 165 lb 8 oz (75.1 kg)    Telemetry    NSR  - Personally Reviewed  ECG    NSR no acute ST changes  - Personally Reviewed  Physical Exam   GEN: No acute distress.  Neck: No JVD Cardiac: RRR, no murmurs, rubs, or gallops.  Respiratory: Clear to auscultation bilaterally. GI: Soft, nontender, non-distended  MS: No edema; No deformity. Neuro:  AAOx3. Psych: Normal affect  Labs    Chemistry Recent Labs Lab 08/28/16 2045  08/29/16 0040 08/29/16 0550 09/03/16 1337 09/03/16 1404 09/03/16 2006  NA 133*  < >  --  137 133* 134* 133*  K 5.2*  < >  --  4.3 4.2 4.2 4.1  CL 100*  < >  --  105 99* 98* 100*  CO2 23  < >  --  25 24  --  26  GLUCOSE 110*  < >  --  104* 132* 131* 107*  BUN 24*  < >  --  17 19 21* 18  CREATININE 0.73  < >  --  0.66 0.77 0.80 0.67  CALCIUM 9.1  < >  --  8.8* 9.2  --  8.9  PROT 6.5  --    --  6.6 6.6  --   --   ALBUMIN 3.8  --   --  3.6 3.7  --   --   AST 43*  --   --  24 23  --   --   ALT 18  --   --  18 16*  --   --   ALKPHOS 59  --   --  59 58  --   --   BILITOT 1.5*  --  0.4 0.5 0.9  --   --   GFRNONAA >60  < >  --  >60 >60  --  >60  GFRAA >60  < >  --  >60 >60  --  >60  ANIONGAP 10  < >  --  7 10  --  7  < > = values in this interval not displayed.   Hematology Recent Labs Lab 08/29/16 0550 09/03/16 1337 09/03/16 1404 09/03/16 2006  WBC 9.1 8.3  --  8.1  RBC 4.11* 4.07*  --  3.85*  HGB 11.9* 11.6* 12.2* 11.1*  HCT 36.5* 35.9* 36.0* 33.9*  MCV 88.8 88.2  --  88.1  MCH 29.0 28.5  --  28.8  MCHC 32.6 32.3  --  32.7  RDW 14.3 14.0  --  14.0  PLT 304 277  --  266    Cardiac Enzymes Recent Labs Lab 08/29/16 0040 08/29/16 0550  TROPONINI <0.03 <0.03    Recent Labs Lab 08/28/16 2050 09/03/16 1401  TROPIPOC 0.01 0.00       Radiology    Dg Chest 2 View  Result Date: 09/03/2016 CLINICAL DATA:  Syncopal episode. Orthostatics hypotension. Lethargy. History of coronary artery disease, previous MI, stent placement, and CABG. EXAM: CHEST  2 VIEW COMPARISON:  PA and lateral chest x-ray of August 28, 2016 FINDINGS: The lungs remain hyperinflated. There is no focal infiltrate. There is no pleural effusion, pneumothorax, or pneumomediastinum. The heart and pulmonary vascularity are normal. There is calcification in the wall of the thoracic aorta. There are post CABG changes. The sternal wires are intact. The bony thorax exhibits no acute abnormality. IMPRESSION: COPD. Previous CABG. No pneumonia, CHF, nor other acute cardiopulmonary abnormality. Thoracic aortic atherosclerosis. Electronically Signed   By: David  Martinique M.D.   On: 09/03/2016 14:26   Ct Head Wo Contrast  Result Date: 09/03/2016 CLINICAL DATA:  Dizziness, altered mental status, possible TIAs EXAM: CT HEAD WITHOUT CONTRAST TECHNIQUE: Contiguous axial images were obtained from the base of the skull  through the vertex without intravenous contrast. COMPARISON:  08/28/2016 FINDINGS: Brain: No evidence of acute infarction, hemorrhage, hydrocephalus, extra-axial collection or mass lesion/mass effect. Subcortical white matter and periventricular small vessel ischemic changes. Mild cortical and central atrophy. Secondary ventricular prominence. Vascular: Intracranial atherosclerosis. Skull: Normal. Negative for fracture or focal lesion. Sinuses/Orbits: The visualized paranasal sinuses are essentially clear. The mastoid air cells are unopacified. Other: None. IMPRESSION: No evidence of acute intracranial abnormality. Atrophy with small vessel ischemic changes. No interval change from recent CT. Electronically Signed   By: Julian Hy M.D.   On: 09/03/2016 14:14   [name]  Result Date: 09/03/2016 CLINICAL DATA:  Found unresponsive at desk. Dehydrated. Recent upper respiratory tract infection. Now disoriented, week with garbled speech and confusion. Symptoms improved when supine. History of prostate cancer. EXAM: MRI HEAD WITHOUT CONTRAST TECHNIQUE: Multiplanar, multiecho pulse sequences of the brain and surrounding structures were obtained without intravenous contrast. COMPARISON:  CT HEAD September 03, 2016 at 1401 hours FINDINGS: BRAIN: Faint reduced diffusion associated with the T2 bright 14 mm lesion LEFT mesial thalamus, normalized ADC values. No mass effect. Ventricles and sulci are normal for patient's age. Patchy to confluent supratentorial white matter FLAIR T2 hyperintensities. No susceptibility artifact to suggest hemorrhage. No midline shift. No abnormal extra-axial fluid collections. VASCULAR: Normal major intracranial vascular flow voids present at skull base. SKULL AND UPPER CERVICAL SPINE: No abnormal sellar expansion. No suspicious calvarial bone marrow signal. Craniocervical junction maintained. SINUSES/ORBITS: Trace paranasal sinus mucosal thickening. Mastoid air cells are well  aerated. Status post bilateral ocular lens implants. The included ocular globes and orbital contents are non-suspicious. OTHER: None. IMPRESSION: 14 mm LEFT thalamus subacute infarct, less likely mass. Recommend 6-8 week follow-up MRI of the head  with contrast. Moderate chronic small vessel ischemic disease. Please note for assessment of intracranial hypotension, contrast enhanced sequences would be indicated as well as correlation with lumbar puncture opening pressures. Electronically Signed   By: Elon Alas M.D.   On: 09/03/2016 19:57    Cardiac Studies   Echo 08/29/16  EF 55-60%  Carotid:  [id number] LICA [id number]  Patient Profile     83 y.o. male admitted from office per Dr Claiborne Billings for postural hypotension  Assessment & Plan    Postural Hypotension:  ARB held hydrated neuro evaluation in progress MRI with subacute Thalamic infarct not clear how this relates Check TSH and cortisol EF normal by echo No high grade carotid disease No focal neuro signs this am consider starting midodrine If conservative measures don't help   Signed, Jenkins Rouge, MD  09/04/2016, 9:02 AM

## 2016-09-04 NOTE — Progress Notes (Signed)
STROKE TEAM PROGRESS NOTE   HISTORY OF PRESENT ILLNESS (per record) Thomas Hanna is an 83 y.o. male with a history of myocardial infarction, coronary artery disease and prostate cancer, admitted for evaluation and management of recurrent episodes of hypotension and loss of consciousness or near loss of consciousness. Symptoms are precipitated by sitting or standing, and relieved by lying supine. Neurology consultation was obtained because of abnormality on MRI indicating probable subacute left thalamic infarction. Exact onset is unclear. He has had episodes of garbled speech and confusion associated with hypotension and orthostasis, which have essentially cleared after lying down. No facial droop is been noted. He said no weakness nor numbness involving right extremities. He takes Plavix 75 mg per day.  LSN: 08/28/2016 tPA Given: No: Unclear onset of thalamic stroke; no focal deficits. mRankin:   SUBJECTIVE (INTERVAL HISTORY) No family members present. Dr. Leonie Man discussed the patient's case. He recommended continuing Plavix (ASA 81 mg and Plavix 75 mg daily PTA) He also recommended a CT angiogram of the head and neck.   OBJECTIVE Temp:  [97.4 F (36.3 C)-97.8 F (36.6 C)] 97.7 F (36.5 C) (01/24 0620) Pulse Rate:  [51-88] 59 (01/23 2145) Cardiac Rhythm: Sinus bradycardia (01/23 2230) Resp:  [11-20] 18 (01/24 0620) BP: (109-174)/(43-85) 143/68 (01/24 0620) SpO2:  [91 %-100 %] 97 % (01/24 0620) Weight:  [75.1 kg (165 lb 8 oz)-76.7 kg (169 lb)] 75.1 kg (165 lb 8 oz) (01/23 2220)  CBC:   Recent Labs Lab 09/03/16 1337 09/03/16 1404 09/03/16 2006  WBC 8.3  --  8.1  NEUTROABS 5.6  --  4.6  HGB 11.6* 12.2* 11.1*  HCT 35.9* 36.0* 33.9*  MCV 88.2  --  88.1  PLT 277  --  123456    Basic Metabolic Panel:   Recent Labs Lab 09/03/16 1337 09/03/16 1404 09/03/16 2006  NA 133* 134* 133*  K 4.2 4.2 4.1  CL 99* 98* 100*  CO2 24  --  26  GLUCOSE 132* 131* 107*  BUN 19 21* 18   CREATININE 0.77 0.80 0.67  CALCIUM 9.2  --  8.9    Lipid Panel: No results found for: CHOL, TRIG, HDL, CHOLHDL, VLDL, LDLCALC HgbA1c: No results found for: HGBA1C Urine Drug Screen:     Component Value Date/Time   LABOPIA NONE DETECTED 08/28/2016 2314   COCAINSCRNUR NONE DETECTED 08/28/2016 2314   LABBENZ NONE DETECTED 08/28/2016 2314   AMPHETMU NONE DETECTED 08/28/2016 2314   THCU NONE DETECTED 08/28/2016 2314   LABBARB NONE DETECTED 08/28/2016 2314      IMAGING  Dg Chest 2 View 09/03/2016 COPD. Previous CABG. No pneumonia, CHF, nor other acute cardiopulmonary abnormality. Thoracic aortic atherosclerosis.    Ct Head Wo Contrast 09/03/2016 No evidence of acute intracranial abnormality. Atrophy with small vessel ischemic changes. No interval change from recent CT.    [name] 09/03/2016 14 mm LEFT thalamus subacute infarct, less likely mass. Recommend 6-8 week follow-up MRI of the head with contrast. Moderate chronic small vessel ischemic disease.  Please note for assessment of intracranial hypotension, contrast enhanced sequences would be indicated as well as correlation with lumbar puncture opening pressures.     CTA Head and Neck - pending    Transthoracic Echocardiogram 08/29/2016 Study Conclusions - Left ventricle: The cavity size was normal. Wall thickness was   increased in a pattern of mild LVH. Systolic function was normal.   The estimated ejection fraction was in the range of 55% to 60%.  Left ventricular diastolic function parameters were normal. - Mitral valve: There is some sub aortic calcium that appears to be   coming from anterior mitral leaflet   no subvalvular gradient. - Left atrium: The atrium was mildly dilated. - Atrial septum: No defect or patent foramen ovale was identified.   PHYSICAL EXAM   Pleasant elderly male not in distress. . Afebrile. Head is nontraumatic. Neck is supple without bruit.    Cardiac exam no murmur or  gallop. Lungs are clear to auscultation. Distal pulses are well felt.  Neurological Exam :  Awake alert oriented to time and place. Diminished attention, registration and recall. Speech and language appear normal. No aphasia or apraxia dysarthria. Extraocular moments are full range without nystagmus. Fundi were not visualized. Face is symmetric without weakness. Tongue is midline. Hearing is diminished bilaterally. Motor system exam reveals no upper or lower extremity drift. No focal weakness. Strength tone and reflexes and coordination are normal. Sensation is intact. Gait was not tested.   ASSESSMENT/PLAN Thomas Hanna is a 83 y.o. male with history of prostate cancer and coronary artery disease with a previous MI presenting with hypotension and presyncope. He did not receive IV t-PA due to unclear time of onset and no focal deficits.  Stroke:  Dominant infarct secondary to small vessel disease.  Resultant  No focal deficits  MRI - 14 mm LEFT thalamus subacute infarct  MRA - not performed  CTA H&N - pending  Carotid Doppler - performed yesterday - results pending.  2D Echo - 08/29/2016 - EF 55-60%. No cardiac source of emboli identified. See results above.  Cortisol - 14.8 (WNL)  TSH - 2.422 (WNL)  LDL - 09/02/2016 - office visit  (Statin intolerant)  HgbA1c - will order  VTE prophylaxis - SCDs Diet Heart Room service appropriate? Yes; Fluid consistency: Thin  aspirin 81 mg daily and clopidogrel 75 mg daily prior to admission, now on clopidogrel 75 mg daily  Patient counseled to be compliant with his antithrombotic medications  Ongoing aggressive stroke risk factor management  Therapy recommendations: pending  Disposition: Pending  Hypertension  Orthostatic hypotension at time of admission  Permissive hypertension (OK if < 220/120) but gradually normalize in 5-7 days  Long-term BP goal normotensive  Hyperlipidemia  Home meds:  Statin intolerant per cardiology  H&P  LDL pending, goal < 70  History of statin intolerance  Continue statin at discharge    Other Stroke Risk Factors  Advanced age  The patient quit smoking 48 years ago.  Coronary artery disease   Other Active Problems  Recommend 6-8 week follow-up MRI of the head with contrast for possible mass. Hx of prostate Ca.  Mild anemia  Hyponatremia - 133  Mild hyperglycemia - await hemoglobin A1c   Hospital day # 0  Mikey Bussing PA-C [facility] Pager [phone] 09/04/2016, 3:12 PM I have personally examined this patient, reviewed notes, independently viewed imaging studies, participated in medical decision making and plan of care.ROS completed by me personally and pertinent positives fully documented  I have made any additions or clarifications directly to the above note. Agree with note above. History is unclear but seems like presyncopal episode with orthostatic hypotension followed by transient disorientation and garbled speech but MRI shows tiny thalamic infarct likely unrelated from small vessel disease but will look for occlusiive disease by checking CT angios. Greater than 50% of time during this 35 minute visit was spent on counseling and coordination of care about stroke and  he address, prevention and treatment discussion  Antony Contras, MD Medical Director [place] Pager: [phone] 09/04/2016 3:31 PM   To contact Stroke Continuity provider, please refer to [email or web address] After hours, contact General Neurology

## 2016-09-04 NOTE — Progress Notes (Signed)
Unable to do admission health history .Will defer to next shift.

## 2016-09-04 NOTE — Care Management Note (Signed)
Case Management Note  Patient Details  Name: Thomas Hanna MRN: [id number] Date of Birth: 1924/02/02  Subjective/Objective:        Patient presented from the cardiologist's office following an episode of symptomatic hypotension. Recently moved from [place] and lives with his son and daughter-in-law.  CM will follow for discharge needs pending PT/OT evals and physician orders.             Action/Plan:   Expected Discharge Date:                  Expected Discharge Plan:     In-House Referral:     Discharge planning Services     Post Acute Care Choice:    Choice offered to:     DME Arranged:    DME Agency:     HH Arranged:    HH Agency:     Status of Service:     If discussed at [employer] of Stay Meetings, dates discussed:    Additional Comments:  Rolm Baptise, RN 09/04/2016, 12:26 PM

## 2016-09-05 NOTE — Progress Notes (Signed)
STROKE TEAM PROGRESS NOTE   HISTORY OF PRESENT ILLNESS (per record) Thomas Hanna is an 83 y.o. male with a history of myocardial infarction, coronary artery disease and prostate cancer, admitted for evaluation and management of recurrent episodes of hypotension and loss of consciousness or near loss of consciousness. Symptoms are precipitated by sitting or standing, and relieved by lying supine. Neurology consultation was obtained because of abnormality on MRI indicating probable subacute left thalamic infarction. Exact onset is unclear. He has had episodes of garbled speech and confusion associated with hypotension and orthostasis, which have essentially cleared after lying down. No facial droop is been noted. He said no weakness nor numbness involving right extremities. He takes Plavix 75 mg per day.  LSN: 08/28/2016 tPA Given: No: Unclear onset of thalamic stroke; no focal deficits. mRankin:   SUBJECTIVE (INTERVAL HISTORY) His son and daughter in law are present. I discussed results of Ct angiogram and orthostatic hypotension treatment with them OBJECTIVE Temp:  [97.5 F (36.4 C)-99.6 F (37.6 C)] 97.5 F (36.4 C) (01/25 0912) Pulse Rate:  [57-66] 64 (01/25 0912) Cardiac Rhythm: Sinus bradycardia (01/25 0701) Resp:  [18-20] 20 (01/25 0912) BP: (111-189)/(40-90) 111/51 (01/25 0912) SpO2:  [96 %-99 %] 99 % (01/25 0912)  CBC:   Recent Labs Lab 09/03/16 1337 09/03/16 1404 09/03/16 2006  WBC 8.3  --  8.1  NEUTROABS 5.6  --  4.6  HGB 11.6* 12.2* 11.1*  HCT 35.9* 36.0* 33.9*  MCV 88.2  --  88.1  PLT 277  --  123456    Basic Metabolic Panel:   Recent Labs Lab 09/03/16 1337 09/03/16 1404 09/03/16 2006  NA 133* 134* 133*  K 4.2 4.2 4.1  CL 99* 98* 100*  CO2 24  --  26  GLUCOSE 132* 131* 107*  BUN 19 21* 18  CREATININE 0.77 0.80 0.67  CALCIUM 9.2  --  8.9    Lipid Panel: No results found for: CHOL, TRIG, HDL, CHOLHDL, VLDL, LDLCALC HgbA1c: No results found for:  HGBA1C Urine Drug Screen:     Component Value Date/Time   LABOPIA NONE DETECTED 08/28/2016 2314   COCAINSCRNUR NONE DETECTED 08/28/2016 2314   LABBENZ NONE DETECTED 08/28/2016 2314   AMPHETMU NONE DETECTED 08/28/2016 2314   THCU NONE DETECTED 08/28/2016 2314   LABBARB NONE DETECTED 08/28/2016 2314      IMAGING  Dg Chest 2 View 09/03/2016 COPD. Previous CABG. No pneumonia, CHF, nor other acute cardiopulmonary abnormality. Thoracic aortic atherosclerosis.    Ct Head Wo Contrast 09/03/2016 No evidence of acute intracranial abnormality. Atrophy with small vessel ischemic changes. No interval change from recent CT.    [name] 09/03/2016 14 mm LEFT thalamus subacute infarct, less likely mass. Recommend 6-8 week follow-up MRI of the head with contrast. Moderate chronic small vessel ischemic disease.  Please note for assessment of intracranial hypotension, contrast enhanced sequences would be indicated as well as correlation with lumbar puncture opening pressures.     CTA Head and Neck - pending    Transthoracic Echocardiogram 08/29/2016 Study Conclusions - Left ventricle: The cavity size was normal. Wall thickness was   increased in a pattern of mild LVH. Systolic function was normal.   The estimated ejection fraction was in the range of 55% to 60%.   Left ventricular diastolic function parameters were normal. - Mitral valve: There is some sub aortic calcium that appears to be   coming from anterior mitral leaflet   no subvalvular gradient. - Left atrium:  The atrium was mildly dilated. - Atrial septum: No defect or patent foramen ovale was identified.   PHYSICAL EXAM   Pleasant elderly male not in distress. . Afebrile. Head is nontraumatic. Neck is supple without bruit.    Cardiac exam no murmur or gallop. Lungs are clear to auscultation. Distal pulses are well felt.  Neurological Exam :  Awake alert oriented to time and place. Diminished attention,  registration and recall. Speech and language appear normal. No aphasia or apraxia dysarthria. Extraocular moments are full range without nystagmus. Fundi were not visualized. Face is symmetric without weakness. Tongue is midline. Hearing is diminished bilaterally. Motor system exam reveals no upper or lower extremity drift. No focal weakness. Strength tone and reflexes and coordination are normal. Sensation is intact. Gait was not tested.   ASSESSMENT/PLAN [name] is a 83 y.o. male with history of prostate cancer and coronary artery disease with a previous MI presenting with hypotension and presyncope. He did not receive IV t-PA due to unclear time of onset and no focal deficits.  Stroke:  Dominant infarct secondary to small vessel disease.  Resultant  No focal deficits  MRI - 14 mm LEFT thalamus subacute infarct  MRA - not performed  CTA H&N -thrombosed 50 origin of the left PCA. Bilateral proximal 65-70% internal carotid artery stenosis as well as supraclinoid atherosclerotic calcified plaque.   Carotid Doppler -   pending.  2D Echo - 08/29/2016 - EF 55-60%. No cardiac source of emboli identified. See results above.  Cortisol - 14.8 (WNL)  TSH - 2.422 (WNL)  LDL - 09/02/2016 - office visit  (Statin intolerant)  HgbA1c - will order  VTE prophylaxis - SCDs Diet Heart Room service appropriate? Yes; Fluid consistency: Thin  aspirin 81 mg daily and clopidogrel 75 mg daily prior to admission, now on clopidogrel 75 mg daily  Patient counseled to be compliant with his antithrombotic medications  Ongoing aggressive stroke risk factor management  Therapy recommendations: pending  Disposition: Pending  Hypertension  Orthostatic hypotension at time of admission  Permissive hypertension (OK if < 220/120) but gradually normalize in 5-7 days  Long-term BP goal normotensive  Hyperlipidemia  Home meds:  Statin intolerant per cardiology H&P  LDL pending, goal <  70  History of statin intolerance  Continue statin at discharge    Other Stroke Risk Factors  Advanced age  The patient quit smoking 48 years ago.  Coronary artery disease   Other Active Problems  Recommend 6-8 week follow-up MRI of the head with contrast for possible mass. Hx of prostate Ca.  Mild anemia  Hyponatremia - 133  Mild hyperglycemia - await hemoglobin A1c   Hospital day # 1    I have personally examined this patient, reviewed notes, independently viewed imaging studies, participated in medical decision making and plan of care.ROS completed by me personally and pertinent positives fully documented  I have made any additions or clarifications directly to the above note. Agree with note above. History is unclear but seems like presyncopal episode with orthostatic hypotension followed by transient disorientation and garbled speech but MRI shows tiny thalamic infarct likely unrelated from small vessel disease  . I had a long discussion the patient and family about orthostatic hypertension and asked him to do orthostatic tolerance exercises and to wear stockings and TED hoses. Cardiology to follow orthostatic hypotension. Discussed with Dr. Johnsie Cancel. Greater than 50% of time during this 25 minute visit was spent on counseling and coordination of care about stroke  and he address, prevention and treatment discussion  Antony Contras, MD Medical Director [facility] Pager: [phone] 09/05/2016 1:51 PM   To contact Stroke Continuity provider, please refer to [email or web address] After hours, contact General Neurology

## 2016-09-05 NOTE — Progress Notes (Signed)
Progress Note  Patient Name: Thomas Hanna Date of Encounter: 09/05/2016  Primary Cardiologist: Claiborne Billings  Subjective   Able to sit up with no dizziness has not walked yet  Long discussion with daughter and son in law  Inpatient Medications    Scheduled Meds: . clopidogrel  75 mg Oral Daily  . dorzolamide  1 drop Both Eyes BID  . gabapentin  600 mg Oral TID  . latanoprost  1 drop Both Eyes QHS  . sodium chloride flush  3 mL Intravenous Q12H  . tamsulosin  0.4 mg Oral QHS   Continuous Infusions: . sodium chloride 75 mL/hr at 09/04/16 0930   PRN Meds: sodium chloride, acetaminophen, nitroGLYCERIN, ondansetron (ZOFRAN) IV, sodium chloride flush   Vital Signs    Vitals:   09/04/16 1628 09/04/16 2100 09/05/16 0100 09/05/16 0500  BP: (!) 176/80 (!) 148/60 (!) 135/48 (!) 119/40  Pulse: 64 (!) 57 (!) 59 (!) 59  Resp:  18 18 18   Temp:  99.6 F (37.6 C) 98 F (36.7 C) 98.1 F (36.7 C)  TempSrc:  Oral Oral Oral  SpO2:  96% 96% 97%  Weight:      Height:        Intake/Output Summary (Last 24 hours) at 09/05/16 0900 Last data filed at 09/05/16 0900  Gross per 24 hour  Intake              240 ml  Output              400 ml  Net             -160 ml   Filed Weights   09/03/16 2220  Weight: 165 lb 8 oz (75.1 kg)    Telemetry    NSR  - Personally Reviewed  ECG    NSR no acute ST changes  - Personally Reviewed  Physical Exam   GEN: No acute distress.  Neck: No JVD Cardiac: RRR, no murmurs, rubs, or gallops.  Respiratory: Clear to auscultation bilaterally. GI: Soft, nontender, non-distended  MS: No edema; No deformity. Neuro:  AAOx3. Psych: Normal affect  Labs    Chemistry  Recent Labs Lab 09/03/16 1337 09/03/16 1404 09/03/16 2006  NA 133* 134* 133*  K 4.2 4.2 4.1  CL 99* 98* 100*  CO2 24  --  26  GLUCOSE 132* 131* 107*  BUN 19 21* 18  CREATININE 0.77 0.80 0.67  CALCIUM 9.2  --  8.9  PROT 6.6  --   --   ALBUMIN 3.7  --   --   AST 23  --   --    ALT 16*  --   --   ALKPHOS 58  --   --   BILITOT 0.9  --   --   GFRNONAA >60  --  >60  GFRAA >60  --  >60  ANIONGAP 10  --  7     Hematology  Recent Labs Lab 09/03/16 1337 09/03/16 1404 09/03/16 2006  WBC 8.3  --  8.1  RBC 4.07*  --  3.85*  HGB 11.6* 12.2* 11.1*  HCT 35.9* 36.0* 33.9*  MCV 88.2  --  88.1  MCH 28.5  --  28.8  MCHC 32.3  --  32.7  RDW 14.0  --  14.0  PLT 277  --  266    Cardiac EnzymesNo results for input(s): TROPONINI in the last 168 hours.   Recent Labs Lab 09/03/16 1401  TROPIPOC 0.00  Radiology    Ct Angio Head W Or Wo Contrast  Result Date: 09/04/2016 CLINICAL DATA:  83 year old male with dizziness and altered mental status found have left thalamic abnormality on MRI favored to be subacute infarct rather than mass. Initial encounter. EXAM: CT ANGIOGRAPHY HEAD AND NECK TECHNIQUE: Multidetector CT imaging of the head and neck was performed using the standard protocol during bolus administration of intravenous contrast. Multiplanar CT image reconstructions and MIPs were obtained to evaluate the vascular anatomy. Carotid stenosis measurements (when applicable) are obtained utilizing NASCET criteria, using the distal internal carotid diameter as the denominator. CONTRAST:  50 mL Isovue 370 COMPARISON:  Brain MRI and noncontrast head CT 09/03/2016. FINDINGS: CT HEAD Brain: Unchanged noncontrast CT appearance of the left thalamus. Stable gray-white matter differentiation throughout the brain. No acute intracranial hemorrhage identified. No midline shift, mass effect, or evidence of intracranial mass lesion. No ventriculomegaly. Calvarium and skull base: No acute osseous abnormality identified. Paranasal sinuses: Visualized paranasal sinuses and mastoids are stable and well pneumatized. Orbits: No acute orbit or scalp soft tissue finding. CTA NECK Skeleton: Osteopenia. Degenerative changes in the spine. Prior median sternotomy. No acute osseous abnormality  identified. Upper chest: Right upper lobe peribronchial thickening. Mild dependent atelectasis. No superior mediastinal lymphadenopathy. Other neck: Negative thyroid, larynx, pharynx, parapharyngeal spaces, retropharyngeal space, sublingual space, submandibular glands and parotid glands. No cervical lymphadenopathy. Aortic arch: Extensive calcified aortic atherosclerosis. Bovine type arch configuration. Right carotid system: No brachiocephalic artery or right CCA origin stenosis. The right CCA origin is mildly tortuous with a kinked appearance. Intermittent plaque in the right CCA without stenosis proximal to the bifurcation. More pronounced soft and calcified plaque at the right carotid bifurcation continuing into the right ICA origin and bulb. At the level of the distal bulb there is significant stenosis which is estimated at 65-70 % stenosis with respect to the distal vessel (series 12, image 221). There is some additional calcified plaque in the more distal right ICA in the neck but no other stenosis. Left carotid system: Bovine type left CCA origin with no stenosis despite soft and calcified plaque. Intermittent soft and calcified plaque in the left carotid proximal to the bifurcation without stenosis. Bulky calcified plaque at the left carotid bifurcation continuing into the left ICA origin and bulb. Maximal stenosis appears at the left ICA origin and is estimated at 65-70 % with respect to the distal vessel. Minimal calcified plaque distal to the bulb then no additional cervical ICA stenosis. Vertebral arteries: Tortuous right subclavian artery origin with soft and calcified plaque and a kinked appearance. Calcified plaque at the right vertebral artery origin with mild to moderate stenosis. Otherwise negative right vertebral artery to the skullbase. Soft and calcified plaque in the proximal left subclavian artery without hemodynamically significant stenosis. Calcified plaque at the left vertebral artery  origin but this does not appear hemodynamically significant. Fairly codominant vertebral arteries, the left is slightly larger. Normal left vertebral artery otherwise to the skullbase. CTA HEAD Posterior circulation: Dominant distal left vertebral artery is normal to the vertebrobasilar junction. Normal left PICA origin. Right V4 segment calcified plaque with mild stenosis. The distal right vertebral artery appears non dominant but remains patent and to the right PICA origin is normal. There is mild basilar artery irregularity without significant stenosis. There are is a fetal type right PCA origin. Both SCA origins are patent. It is unclear whether oval left PCA origin is partially fetal, but if so the left posterior communicating artery is occluded (  series 12, image 106). There is a diminutive but patent left P1 segment (series 13, image 116). The more distal left P1 and the remaining left PCA are then within normal limits. Right PCA branches are within normal limits. Anterior circulation: Both ICA siphons are patent. There is moderate cavernous and moderate to severe supraclinoid calcified plaque. There is bilateral moderate to severe supraclinoid ICA stenosis. Both ophthalmic and a right posterior communicating artery origins are normal. There does appear to be a left posterior communicating artery which is occluded just beyond its origin (image 106 as stated above). Both carotid termini are patent. The MCA and ACA origins are patent. The anterior communicating artery and bilateral ACA branches are within normal limits. The left MCA M1 segment, bifurcation, and left MCA branches are within normal limits. The right MCA M1 segment, bifurcation, and right MCA branches are within normal limits. Venous sinuses: Patent. Anatomic variants: Bovine type arch configuration. Mildly dominant left vertebral artery. Fetal type right, and probably also left PCA origins. Delayed phase: No abnormal enhancement identified. Review  of the MIP images confirms the above findings IMPRESSION: 1. With regard to the left thalamic finding there is strong evidence of a mostly thrombosed fetal origin of the Left PCA (series 12, image 106). However, the more distal P1 and remaining left PCA remain normal. This appearance could be expected to cause Left Thalamostriate Artery territory ischemia, favoring infarct as the etiology of the described MRI signal abnormality. Still, an 8-12 week follow-up repeat MRI would be complementary to confirm expected post ischemic evolution. 2. Positive also for hemodynamically significant Bilateral proximal ICA (numerically estimated at 65-70%) and Supraclinoid ICA atherosclerotic stenoses related to bulky calcified plaque. 3. Otherwise negative anterior circulation. 4. Mild bilateral vertebral artery calcified plaque without hemodynamically significant stenosis. 5. Stable CT appearance of the brain since yesterday. Electronically Signed   By: Genevie Ann M.D.   On: 09/04/2016 16:53   Dg Chest 2 View  Result Date: 09/03/2016 CLINICAL DATA:  Syncopal episode. Orthostatics hypotension. Lethargy. History of coronary artery disease, previous MI, stent placement, and CABG. EXAM: CHEST  2 VIEW COMPARISON:  PA and lateral chest x-ray of August 28, 2016 FINDINGS: The lungs remain hyperinflated. There is no focal infiltrate. There is no pleural effusion, pneumothorax, or pneumomediastinum. The heart and pulmonary vascularity are normal. There is calcification in the wall of the thoracic aorta. There are post CABG changes. The sternal wires are intact. The bony thorax exhibits no acute abnormality. IMPRESSION: COPD. Previous CABG. No pneumonia, CHF, nor other acute cardiopulmonary abnormality. Thoracic aortic atherosclerosis. Electronically Signed   By: David  Martinique M.D.   On: 09/03/2016 14:26   Ct Head Wo Contrast  Result Date: 09/03/2016 CLINICAL DATA:  Dizziness, altered mental status, possible TIAs EXAM: CT HEAD WITHOUT  CONTRAST TECHNIQUE: Contiguous axial images were obtained from the base of the skull through the vertex without intravenous contrast. COMPARISON:  08/28/2016 FINDINGS: Brain: No evidence of acute infarction, hemorrhage, hydrocephalus, extra-axial collection or mass lesion/mass effect. Subcortical white matter and periventricular small vessel ischemic changes. Mild cortical and central atrophy. Secondary ventricular prominence. Vascular: Intracranial atherosclerosis. Skull: Normal. Negative for fracture or focal lesion. Sinuses/Orbits: The visualized paranasal sinuses are essentially clear. The mastoid air cells are unopacified. Other: None. IMPRESSION: No evidence of acute intracranial abnormality. Atrophy with small vessel ischemic changes. No interval change from recent CT. Electronically Signed   By: Julian Hy M.D.   On: 09/03/2016 14:14   Ct Angio Neck W Or  Wo Contrast  Result Date: 09/04/2016 CLINICAL DATA:  83 year old male with dizziness and altered mental status found have left thalamic abnormality on MRI favored to be subacute infarct rather than mass. Initial encounter. EXAM: CT ANGIOGRAPHY HEAD AND NECK TECHNIQUE: Multidetector CT imaging of the head and neck was performed using the standard protocol during bolus administration of intravenous contrast. Multiplanar CT image reconstructions and MIPs were obtained to evaluate the vascular anatomy. Carotid stenosis measurements (when applicable) are obtained utilizing NASCET criteria, using the distal internal carotid diameter as the denominator. CONTRAST:  50 mL Isovue 370 COMPARISON:  Brain MRI and noncontrast head CT 09/03/2016. FINDINGS: CT HEAD Brain: Unchanged noncontrast CT appearance of the left thalamus. Stable gray-white matter differentiation throughout the brain. No acute intracranial hemorrhage identified. No midline shift, mass effect, or evidence of intracranial mass lesion. No ventriculomegaly. Calvarium and skull base: No acute  osseous abnormality identified. Paranasal sinuses: Visualized paranasal sinuses and mastoids are stable and well pneumatized. Orbits: No acute orbit or scalp soft tissue finding. CTA NECK Skeleton: Osteopenia. Degenerative changes in the spine. Prior median sternotomy. No acute osseous abnormality identified. Upper chest: Right upper lobe peribronchial thickening. Mild dependent atelectasis. No superior mediastinal lymphadenopathy. Other neck: Negative thyroid, larynx, pharynx, parapharyngeal spaces, retropharyngeal space, sublingual space, submandibular glands and parotid glands. No cervical lymphadenopathy. Aortic arch: Extensive calcified aortic atherosclerosis. Bovine type arch configuration. Right carotid system: No brachiocephalic artery or right CCA origin stenosis. The right CCA origin is mildly tortuous with a kinked appearance. Intermittent plaque in the right CCA without stenosis proximal to the bifurcation. More pronounced soft and calcified plaque at the right carotid bifurcation continuing into the right ICA origin and bulb. At the level of the distal bulb there is significant stenosis which is estimated at 65-70 % stenosis with respect to the distal vessel (series 12, image 221). There is some additional calcified plaque in the more distal right ICA in the neck but no other stenosis. Left carotid system: Bovine type left CCA origin with no stenosis despite soft and calcified plaque. Intermittent soft and calcified plaque in the left carotid proximal to the bifurcation without stenosis. Bulky calcified plaque at the left carotid bifurcation continuing into the left ICA origin and bulb. Maximal stenosis appears at the left ICA origin and is estimated at 65-70 % with respect to the distal vessel. Minimal calcified plaque distal to the bulb then no additional cervical ICA stenosis. Vertebral arteries: Tortuous right subclavian artery origin with soft and calcified plaque and a kinked appearance. Calcified  plaque at the right vertebral artery origin with mild to moderate stenosis. Otherwise negative right vertebral artery to the skullbase. Soft and calcified plaque in the proximal left subclavian artery without hemodynamically significant stenosis. Calcified plaque at the left vertebral artery origin but this does not appear hemodynamically significant. Fairly codominant vertebral arteries, the left is slightly larger. Normal left vertebral artery otherwise to the skullbase. CTA HEAD Posterior circulation: Dominant distal left vertebral artery is normal to the vertebrobasilar junction. Normal left PICA origin. Right V4 segment calcified plaque with mild stenosis. The distal right vertebral artery appears non dominant but remains patent and to the right PICA origin is normal. There is mild basilar artery irregularity without significant stenosis. There are is a fetal type right PCA origin. Both SCA origins are patent. It is unclear whether oval left PCA origin is partially fetal, but if so the left posterior communicating artery is occluded (series 12, image 106). There is a diminutive but  patent left P1 segment (series 13, image 116). The more distal left P1 and the remaining left PCA are then within normal limits. Right PCA branches are within normal limits. Anterior circulation: Both ICA siphons are patent. There is moderate cavernous and moderate to severe supraclinoid calcified plaque. There is bilateral moderate to severe supraclinoid ICA stenosis. Both ophthalmic and a right posterior communicating artery origins are normal. There does appear to be a left posterior communicating artery which is occluded just beyond its origin (image 106 as stated above). Both carotid termini are patent. The MCA and ACA origins are patent. The anterior communicating artery and bilateral ACA branches are within normal limits. The left MCA M1 segment, bifurcation, and left MCA branches are within normal limits. The right MCA M1  segment, bifurcation, and right MCA branches are within normal limits. Venous sinuses: Patent. Anatomic variants: Bovine type arch configuration. Mildly dominant left vertebral artery. Fetal type right, and probably also left PCA origins. Delayed phase: No abnormal enhancement identified. Review of the MIP images confirms the above findings IMPRESSION: 1. With regard to the left thalamic finding there is strong evidence of a mostly thrombosed fetal origin of the Left PCA (series 12, image 106). However, the more distal P1 and remaining left PCA remain normal. This appearance could be expected to cause Left Thalamostriate Artery territory ischemia, favoring infarct as the etiology of the described MRI signal abnormality. Still, an 8-12 week follow-up repeat MRI would be complementary to confirm expected post ischemic evolution. 2. Positive also for hemodynamically significant Bilateral proximal ICA (numerically estimated at 65-70%) and Supraclinoid ICA atherosclerotic stenoses related to bulky calcified plaque. 3. Otherwise negative anterior circulation. 4. Mild bilateral vertebral artery calcified plaque without hemodynamically significant stenosis. 5. Stable CT appearance of the brain since yesterday. Electronically Signed   By: Genevie Ann M.D.   On: 09/04/2016 16:53   [name]  Result Date: 09/03/2016 CLINICAL DATA:  Found unresponsive at desk. Dehydrated. Recent upper respiratory tract infection. Now disoriented, week with garbled speech and confusion. Symptoms improved when supine. History of prostate cancer. EXAM: MRI HEAD WITHOUT CONTRAST TECHNIQUE: Multiplanar, multiecho pulse sequences of the brain and surrounding structures were obtained without intravenous contrast. COMPARISON:  CT HEAD September 03, 2016 at 1401 hours FINDINGS: BRAIN: Faint reduced diffusion associated with the T2 bright 14 mm lesion LEFT mesial thalamus, normalized ADC values. No mass effect. Ventricles and sulci are normal  for patient's age. Patchy to confluent supratentorial white matter FLAIR T2 hyperintensities. No susceptibility artifact to suggest hemorrhage. No midline shift. No abnormal extra-axial fluid collections. VASCULAR: Normal major intracranial vascular flow voids present at skull base. SKULL AND UPPER CERVICAL SPINE: No abnormal sellar expansion. No suspicious calvarial bone marrow signal. Craniocervical junction maintained. SINUSES/ORBITS: Trace paranasal sinus mucosal thickening. Mastoid air cells are well aerated. Status post bilateral ocular lens implants. The included ocular globes and orbital contents are non-suspicious. OTHER: None. IMPRESSION: 14 mm LEFT thalamus subacute infarct, less likely mass. Recommend 6-8 week follow-up MRI of the head with contrast. Moderate chronic small vessel ischemic disease. Please note for assessment of intracranial hypotension, contrast enhanced sequences would be indicated as well as correlation with lumbar puncture opening pressures. Electronically Signed   By: Elon Alas M.D.   On: 09/03/2016 19:57    Cardiac Studies   Echo 08/29/16  EF 55-60%  Carotid:  [id number] LICA [id number]  Patient Profile     83 y.o. male admitted from office per Dr Claiborne Billings for postural  hypotension  Assessment & Plan    Postural Hypotension:  ARB held hydrated neuro evaluation in progress MRI with subacute Thalamic infarct not clear how this relates TSH and cortisol ok. Moderate 60-79% bilateral Carotid disease does not need intervention. Continue midodrine and hydration Ambulate PT evaluation.   Signed, Jenkins Rouge, MD  09/05/2016, 9:00 AM   Patient ID: Luiz Blare, male   DOB: 1924/05/27, 83 y.o.   MRN: [id number]

## 2016-09-05 NOTE — Progress Notes (Signed)
Orthostatic vitals completed as ordered. Patient was unable to stand for log. He stated that he "feels tired" O2 dropped to 85, but immediately back up to 97% as soon as patient sat down. Will continue to monitor.

## 2016-09-06 LAB — BASIC METABOLIC PANEL
Anion gap: 8 (ref 5–15)
BUN: 13 mg/dL (ref 6–20)
CHLORIDE: 101 mmol/L (ref 101–111)
CO2: 25 mmol/L (ref 22–32)
Calcium: 9 mg/dL (ref 8.9–10.3)
Creatinine, Ser: 0.76 mg/dL (ref 0.61–1.24)
GFR calc non Af Amer: >60 mL/min (ref >60)
Glucose, Bld: 110 mg/dL — ABNORMAL HIGH (ref 65–99)
POTASSIUM: 4.3 mmol/L (ref 3.5–5.1)
SODIUM: 134 mmol/L — AB (ref 135–145)

## 2016-09-06 LAB — HEMOGLOBIN A1C
Hgb A1c MFr Bld: 5.7 % — ABNORMAL HIGH (ref 4.8–5.6)
Mean Plasma Glucose: 117 mg/dL

## 2016-09-06 MED ORDER — MIDODRINE HCL 5 MG PO TABS
5.0000 mg | ORAL_TABLET | Freq: Three times a day (TID) | ORAL | Status: DC
  Administered 2016-09-06 – 2016-09-09 (×9): 5 mg via ORAL
  Filled 2016-09-06 (×9): qty 1

## 2016-09-06 NOTE — Progress Notes (Signed)
Progress Note  Patient Name: Thomas Hanna Date of Encounter: 09/06/2016  Primary Cardiologist: Claiborne Billings  Subjective   Good day. Still too week to ambulate but able to stand and sit in chair with systolic BP's 99991111 mmHg    Inpatient Medications    Scheduled Meds: . clopidogrel  75 mg Oral Daily  . dorzolamide  1 drop Both Eyes BID  . gabapentin  600 mg Oral TID  . latanoprost  1 drop Both Eyes QHS  . sodium chloride flush  3 mL Intravenous Q12H  . tamsulosin  0.4 mg Oral QHS   Continuous Infusions: . sodium chloride 75 mL/hr at 09/04/16 0930   PRN Meds: sodium chloride, acetaminophen, nitroGLYCERIN, ondansetron (ZOFRAN) IV, sodium chloride flush   Vital Signs    Vitals:   09/05/16 1600 09/05/16 2148 09/06/16 0109 09/06/16 0558  BP: (!) 145/65 (!) 142/62 (!) 141/62 (!) 127/58  Pulse: (!) 59 62 60 61  Resp: 18 18 20 20   Temp: 98.2 F (36.8 C) 98.2 F (36.8 C) 98 F (36.7 C) 97.8 F (36.6 C)  TempSrc: Oral Oral Oral Oral  SpO2: 100% 100% 100% 100%  Weight:      Height:        Intake/Output Summary (Last 24 hours) at 09/06/16 0900 Last data filed at 09/06/16 0612  Gross per 24 hour  Intake              480 ml  Output             1200 ml  Net             -720 ml   Filed Weights   09/03/16 2220  Weight: 165 lb 8 oz (75.1 kg)    Telemetry    NSR  - Personally Reviewed  ECG    NSR no acute ST changes  - Personally Reviewed  Physical Exam   GEN: No acute distress.  Neck: No JVD Cardiac: RRR, no murmurs, rubs, or gallops.  Respiratory: Clear to auscultation bilaterally. GI: Soft, nontender, non-distended  MS: No edema; No deformity. Neuro:  AAOx3. Psych: Normal affect  Labs    Chemistry  Recent Labs Lab 09/03/16 1337 09/03/16 1404 09/03/16 2006  NA 133* 134* 133*  K 4.2 4.2 4.1  CL 99* 98* 100*  CO2 24  --  26  GLUCOSE 132* 131* 107*  BUN 19 21* 18  CREATININE 0.77 0.80 0.67  CALCIUM 9.2  --  8.9  PROT 6.6  --   --   ALBUMIN  3.7  --   --   AST 23  --   --   ALT 16*  --   --   ALKPHOS 58  --   --   BILITOT 0.9  --   --   GFRNONAA >60  --  >60  GFRAA >60  --  >60  ANIONGAP 10  --  7     Hematology  Recent Labs Lab 09/03/16 1337 09/03/16 1404 09/03/16 2006  WBC 8.3  --  8.1  RBC 4.07*  --  3.85*  HGB 11.6* 12.2* 11.1*  HCT 35.9* 36.0* 33.9*  MCV 88.2  --  88.1  MCH 28.5  --  28.8  MCHC 32.3  --  32.7  RDW 14.0  --  14.0  PLT 277  --  266    Cardiac EnzymesNo results for input(s): TROPONINI in the last 168 hours.   Recent Labs Lab 09/03/16 1401  TROPIPOC 0.00  Radiology    Ct Angio Head W Or Wo Contrast  Result Date: 09/04/2016 CLINICAL DATA:  83 year old male with dizziness and altered mental status found have left thalamic abnormality on MRI favored to be subacute infarct rather than mass. Initial encounter. EXAM: CT ANGIOGRAPHY HEAD AND NECK TECHNIQUE: Multidetector CT imaging of the head and neck was performed using the standard protocol during bolus administration of intravenous contrast. Multiplanar CT image reconstructions and MIPs were obtained to evaluate the vascular anatomy. Carotid stenosis measurements (when applicable) are obtained utilizing NASCET criteria, using the distal internal carotid diameter as the denominator. CONTRAST:  50 mL Isovue 370 COMPARISON:  Brain MRI and noncontrast head CT 09/03/2016. FINDINGS: CT HEAD Brain: Unchanged noncontrast CT appearance of the left thalamus. Stable gray-white matter differentiation throughout the brain. No acute intracranial hemorrhage identified. No midline shift, mass effect, or evidence of intracranial mass lesion. No ventriculomegaly. Calvarium and skull base: No acute osseous abnormality identified. Paranasal sinuses: Visualized paranasal sinuses and mastoids are stable and well pneumatized. Orbits: No acute orbit or scalp soft tissue finding. CTA NECK Skeleton: Osteopenia. Degenerative changes in the spine. Prior median  sternotomy. No acute osseous abnormality identified. Upper chest: Right upper lobe peribronchial thickening. Mild dependent atelectasis. No superior mediastinal lymphadenopathy. Other neck: Negative thyroid, larynx, pharynx, parapharyngeal spaces, retropharyngeal space, sublingual space, submandibular glands and parotid glands. No cervical lymphadenopathy. Aortic arch: Extensive calcified aortic atherosclerosis. Bovine type arch configuration. Right carotid system: No brachiocephalic artery or right CCA origin stenosis. The right CCA origin is mildly tortuous with a kinked appearance. Intermittent plaque in the right CCA without stenosis proximal to the bifurcation. More pronounced soft and calcified plaque at the right carotid bifurcation continuing into the right ICA origin and bulb. At the level of the distal bulb there is significant stenosis which is estimated at 65-70 % stenosis with respect to the distal vessel (series 12, image 221). There is some additional calcified plaque in the more distal right ICA in the neck but no other stenosis. Left carotid system: Bovine type left CCA origin with no stenosis despite soft and calcified plaque. Intermittent soft and calcified plaque in the left carotid proximal to the bifurcation without stenosis. Bulky calcified plaque at the left carotid bifurcation continuing into the left ICA origin and bulb. Maximal stenosis appears at the left ICA origin and is estimated at 65-70 % with respect to the distal vessel. Minimal calcified plaque distal to the bulb then no additional cervical ICA stenosis. Vertebral arteries: Tortuous right subclavian artery origin with soft and calcified plaque and a kinked appearance. Calcified plaque at the right vertebral artery origin with mild to moderate stenosis. Otherwise negative right vertebral artery to the skullbase. Soft and calcified plaque in the proximal left subclavian artery without hemodynamically significant stenosis. Calcified  plaque at the left vertebral artery origin but this does not appear hemodynamically significant. Fairly codominant vertebral arteries, the left is slightly larger. Normal left vertebral artery otherwise to the skullbase. CTA HEAD Posterior circulation: Dominant distal left vertebral artery is normal to the vertebrobasilar junction. Normal left PICA origin. Right V4 segment calcified plaque with mild stenosis. The distal right vertebral artery appears non dominant but remains patent and to the right PICA origin is normal. There is mild basilar artery irregularity without significant stenosis. There are is a fetal type right PCA origin. Both SCA origins are patent. It is unclear whether oval left PCA origin is partially fetal, but if so the left posterior communicating artery is occluded (  series 12, image 106). There is a diminutive but patent left P1 segment (series 13, image 116). The more distal left P1 and the remaining left PCA are then within normal limits. Right PCA branches are within normal limits. Anterior circulation: Both ICA siphons are patent. There is moderate cavernous and moderate to severe supraclinoid calcified plaque. There is bilateral moderate to severe supraclinoid ICA stenosis. Both ophthalmic and a right posterior communicating artery origins are normal. There does appear to be a left posterior communicating artery which is occluded just beyond its origin (image 106 as stated above). Both carotid termini are patent. The MCA and ACA origins are patent. The anterior communicating artery and bilateral ACA branches are within normal limits. The left MCA M1 segment, bifurcation, and left MCA branches are within normal limits. The right MCA M1 segment, bifurcation, and right MCA branches are within normal limits. Venous sinuses: Patent. Anatomic variants: Bovine type arch configuration. Mildly dominant left vertebral artery. Fetal type right, and probably also left PCA origins. Delayed phase: No  abnormal enhancement identified. Review of the MIP images confirms the above findings IMPRESSION: 1. With regard to the left thalamic finding there is strong evidence of a mostly thrombosed fetal origin of the Left PCA (series 12, image 106). However, the more distal P1 and remaining left PCA remain normal. This appearance could be expected to cause Left Thalamostriate Artery territory ischemia, favoring infarct as the etiology of the described MRI signal abnormality. Still, an 8-12 week follow-up repeat MRI would be complementary to confirm expected post ischemic evolution. 2. Positive also for hemodynamically significant Bilateral proximal ICA (numerically estimated at 65-70%) and Supraclinoid ICA atherosclerotic stenoses related to bulky calcified plaque. 3. Otherwise negative anterior circulation. 4. Mild bilateral vertebral artery calcified plaque without hemodynamically significant stenosis. 5. Stable CT appearance of the brain since yesterday. Electronically Signed   By: Genevie Ann M.D.   On: 09/04/2016 16:53   Ct Angio Neck W Or Wo Contrast  Result Date: 09/04/2016 CLINICAL DATA:  83 year old male with dizziness and altered mental status found have left thalamic abnormality on MRI favored to be subacute infarct rather than mass. Initial encounter. EXAM: CT ANGIOGRAPHY HEAD AND NECK TECHNIQUE: Multidetector CT imaging of the head and neck was performed using the standard protocol during bolus administration of intravenous contrast. Multiplanar CT image reconstructions and MIPs were obtained to evaluate the vascular anatomy. Carotid stenosis measurements (when applicable) are obtained utilizing NASCET criteria, using the distal internal carotid diameter as the denominator. CONTRAST:  50 mL Isovue 370 COMPARISON:  Brain MRI and noncontrast head CT 09/03/2016. FINDINGS: CT HEAD Brain: Unchanged noncontrast CT appearance of the left thalamus. Stable gray-white matter differentiation throughout the brain. No  acute intracranial hemorrhage identified. No midline shift, mass effect, or evidence of intracranial mass lesion. No ventriculomegaly. Calvarium and skull base: No acute osseous abnormality identified. Paranasal sinuses: Visualized paranasal sinuses and mastoids are stable and well pneumatized. Orbits: No acute orbit or scalp soft tissue finding. CTA NECK Skeleton: Osteopenia. Degenerative changes in the spine. Prior median sternotomy. No acute osseous abnormality identified. Upper chest: Right upper lobe peribronchial thickening. Mild dependent atelectasis. No superior mediastinal lymphadenopathy. Other neck: Negative thyroid, larynx, pharynx, parapharyngeal spaces, retropharyngeal space, sublingual space, submandibular glands and parotid glands. No cervical lymphadenopathy. Aortic arch: Extensive calcified aortic atherosclerosis. Bovine type arch configuration. Right carotid system: No brachiocephalic artery or right CCA origin stenosis. The right CCA origin is mildly tortuous with a kinked appearance. Intermittent plaque in the right  CCA without stenosis proximal to the bifurcation. More pronounced soft and calcified plaque at the right carotid bifurcation continuing into the right ICA origin and bulb. At the level of the distal bulb there is significant stenosis which is estimated at 65-70 % stenosis with respect to the distal vessel (series 12, image 221). There is some additional calcified plaque in the more distal right ICA in the neck but no other stenosis. Left carotid system: Bovine type left CCA origin with no stenosis despite soft and calcified plaque. Intermittent soft and calcified plaque in the left carotid proximal to the bifurcation without stenosis. Bulky calcified plaque at the left carotid bifurcation continuing into the left ICA origin and bulb. Maximal stenosis appears at the left ICA origin and is estimated at 65-70 % with respect to the distal vessel. Minimal calcified plaque distal to the  bulb then no additional cervical ICA stenosis. Vertebral arteries: Tortuous right subclavian artery origin with soft and calcified plaque and a kinked appearance. Calcified plaque at the right vertebral artery origin with mild to moderate stenosis. Otherwise negative right vertebral artery to the skullbase. Soft and calcified plaque in the proximal left subclavian artery without hemodynamically significant stenosis. Calcified plaque at the left vertebral artery origin but this does not appear hemodynamically significant. Fairly codominant vertebral arteries, the left is slightly larger. Normal left vertebral artery otherwise to the skullbase. CTA HEAD Posterior circulation: Dominant distal left vertebral artery is normal to the vertebrobasilar junction. Normal left PICA origin. Right V4 segment calcified plaque with mild stenosis. The distal right vertebral artery appears non dominant but remains patent and to the right PICA origin is normal. There is mild basilar artery irregularity without significant stenosis. There are is a fetal type right PCA origin. Both SCA origins are patent. It is unclear whether oval left PCA origin is partially fetal, but if so the left posterior communicating artery is occluded (series 12, image 106). There is a diminutive but patent left P1 segment (series 13, image 116). The more distal left P1 and the remaining left PCA are then within normal limits. Right PCA branches are within normal limits. Anterior circulation: Both ICA siphons are patent. There is moderate cavernous and moderate to severe supraclinoid calcified plaque. There is bilateral moderate to severe supraclinoid ICA stenosis. Both ophthalmic and a right posterior communicating artery origins are normal. There does appear to be a left posterior communicating artery which is occluded just beyond its origin (image 106 as stated above). Both carotid termini are patent. The MCA and ACA origins are patent. The anterior  communicating artery and bilateral ACA branches are within normal limits. The left MCA M1 segment, bifurcation, and left MCA branches are within normal limits. The right MCA M1 segment, bifurcation, and right MCA branches are within normal limits. Venous sinuses: Patent. Anatomic variants: Bovine type arch configuration. Mildly dominant left vertebral artery. Fetal type right, and probably also left PCA origins. Delayed phase: No abnormal enhancement identified. Review of the MIP images confirms the above findings IMPRESSION: 1. With regard to the left thalamic finding there is strong evidence of a mostly thrombosed fetal origin of the Left PCA (series 12, image 106). However, the more distal P1 and remaining left PCA remain normal. This appearance could be expected to cause Left Thalamostriate Artery territory ischemia, favoring infarct as the etiology of the described MRI signal abnormality. Still, an 8-12 week follow-up repeat MRI would be complementary to confirm expected post ischemic evolution. 2. Positive also for hemodynamically significant  Bilateral proximal ICA (numerically estimated at 65-70%) and Supraclinoid ICA atherosclerotic stenoses related to bulky calcified plaque. 3. Otherwise negative anterior circulation. 4. Mild bilateral vertebral artery calcified plaque without hemodynamically significant stenosis. 5. Stable CT appearance of the brain since yesterday. Electronically Signed   By: Genevie Ann M.D.   On: 09/04/2016 16:53    Cardiac Studies   Echo 08/29/16  EF 55-60%  Carotid:  [id number] LICA [id number]  Patient Profile     83 y.o. male admitted from office per Dr Claiborne Billings for postural hypotension  Assessment & Plan    Postural Hypotension:  ARB held hydrated neuro evaluation done  MRI with subacute Thalamic infarct not clear how this relates TSH and cortisol ok. Moderate 60-79% bilateral Carotid disease does not need intervention. Plavix recommended.  Will heplock iv Check BMET in am labs ok.  Continue midodrine PT to ambulate possible d/c in am If he can ambulate down hall  Outpatient f/u Mellissa Kohut, Jenkins Rouge, MD  09/06/2016, 9:00 AM   Patient ID: Thomas Hanna, male   DOB: [birth year], 83 y.o.   MRN: [id number]

## 2016-09-06 NOTE — Progress Notes (Signed)
STROKE TEAM PROGRESS NOTE   HISTORY OF PRESENT ILLNESS (per record) Thomas Hanna is an 83 y.o. male with a history of myocardial infarction, coronary artery disease and prostate cancer, admitted for evaluation and management of recurrent episodes of hypotension and loss of consciousness or near loss of consciousness. Symptoms are precipitated by sitting or standing, and relieved by lying supine. Neurology consultation was obtained because of abnormality on MRI indicating probable subacute left thalamic infarction. Exact onset is unclear. He has had episodes of garbled speech and confusion associated with hypotension and orthostasis, which have essentially cleared after lying down. No facial droop is been noted. He said no weakness nor numbness involving right extremities. He takes Plavix 75 mg per day.  LSN: 08/28/2016 tPA Given: No: Unclear onset of thalamic stroke; no focal deficits. mRankin:   SUBJECTIVE (INTERVAL HISTORY) His son and daughter in law are present. Patient slightly better but yet has orthostatic hypotension   OBJECTIVE Temp:  [97.5 F (36.4 C)-98.2 F (36.8 C)] 97.8 F (36.6 C) (01/26 0558) Pulse Rate:  [57-66] 66 (01/26 0946) Cardiac Rhythm: Sinus bradycardia (01/26 0700) Resp:  [18-20] 18 (01/26 0946) BP: (97-158)/(51-65) 97/57 (01/26 0949) SpO2:  [98 %-100 %] 100 % (01/26 0558)  CBC:   Recent Labs Lab 09/03/16 1337 09/03/16 1404 09/03/16 2006  WBC 8.3  --  8.1  NEUTROABS 5.6  --  4.6  HGB 11.6* 12.2* 11.1*  HCT 35.9* 36.0* 33.9*  MCV 88.2  --  88.1  PLT 277  --  123456    Basic Metabolic Panel:   Recent Labs Lab 09/03/16 2006 09/06/16 0908  NA 133* 134*  K 4.1 4.3  CL 100* 101  CO2 26 25  GLUCOSE 107* 110*  BUN 18 13  CREATININE 0.67 0.76  CALCIUM 8.9 9.0    Lipid Panel: No results found for: CHOL, TRIG, HDL, CHOLHDL, VLDL, LDLCALC HgbA1c:  Lab Results  Component Value Date   HGBA1C 5.7 (H) 09/05/2016   Urine Drug Screen:      Component Value Date/Time   LABOPIA NONE DETECTED 08/28/2016 2314   COCAINSCRNUR NONE DETECTED 08/28/2016 2314   LABBENZ NONE DETECTED 08/28/2016 2314   AMPHETMU NONE DETECTED 08/28/2016 2314   THCU NONE DETECTED 08/28/2016 2314   LABBARB NONE DETECTED 08/28/2016 2314      IMAGING  Dg Chest 2 View 09/03/2016 COPD. Previous CABG. No pneumonia, CHF, nor other acute cardiopulmonary abnormality. Thoracic aortic atherosclerosis.    Ct Head Wo Contrast 09/03/2016 No evidence of acute intracranial abnormality. Atrophy with small vessel ischemic changes. No interval change from recent CT.    [name] 09/03/2016 14 mm LEFT thalamus subacute infarct, less likely mass. Recommend 6-8 week follow-up MRI of the head with contrast. Moderate chronic small vessel ischemic disease.  Please note for assessment of intracranial hypotension, contrast enhanced sequences would be indicated as well as correlation with lumbar puncture opening pressures.     CTA Head and Neck - pending    Transthoracic Echocardiogram 08/29/2016 Study Conclusions - Left ventricle: The cavity size was normal. Wall thickness was   increased in a pattern of mild LVH. Systolic function was normal.   The estimated ejection fraction was in the range of 55% to 60%.   Left ventricular diastolic function parameters were normal. - Mitral valve: There is some sub aortic calcium that appears to be   coming from anterior mitral leaflet   no subvalvular gradient. - Left atrium: The atrium was mildly dilated. -  Atrial septum: No defect or patent foramen ovale was identified.   PHYSICAL EXAM   Pleasant elderly male not in distress. . Afebrile. Head is nontraumatic. Neck is supple without bruit.    Cardiac exam no murmur or gallop. Lungs are clear to auscultation. Distal pulses are well felt.  Neurological Exam :  Awake alert oriented to time and place. Diminished attention, registration and recall. Speech and  language appear normal. No aphasia or apraxia dysarthria. Extraocular moments are full range without nystagmus. Fundi were not visualized. Face is symmetric without weakness. Tongue is midline. Hearing is diminished bilaterally. Motor system exam reveals no upper or lower extremity drift. No focal weakness. Strength tone and reflexes and coordination are normal. Sensation is intact. Gait was not tested.   ASSESSMENT/PLAN [name] is a 83 y.o. male with history of prostate cancer and coronary artery disease with a previous MI presenting with hypotension and presyncope. He did not receive IV t-PA due to unclear time of onset and no focal deficits.  Stroke:  Dominant infarct secondary to small vessel disease.  Resultant  No focal deficits  MRI - 14 mm LEFT thalamus subacute infarct  MRA - not performed  CTA H&N -thrombosed 50 origin of the left PCA. Bilateral proximal 65-70% internal carotid artery stenosis as well as supraclinoid atherosclerotic calcified plaque.   Carotid Doppler -   pending.  2D Echo - 08/29/2016 - EF 55-60%. No cardiac source of emboli identified. See results above.  Cortisol - 14.8 (WNL)  TSH - 2.422 (WNL)  LDL - 09/02/2016 - office visit  (Statin intolerant)  HgbA1c -5.7  VTE prophylaxis - SCDs Diet Heart Room service appropriate? Yes; Fluid consistency: Thin  aspirin 81 mg daily and clopidogrel 75 mg daily prior to admission, now on clopidogrel 75 mg daily  Patient counseled to be compliant with his antithrombotic medications  Ongoing aggressive stroke risk factor management  Therapy recommendations: pending  Disposition: Pending  Hypertension  Orthostatic hypotension at time of admission  Permissive hypertension (OK if < 220/120) but gradually normalize in 5-7 days  Long-term BP goal normotensive  Hyperlipidemia  Home meds:  Statin intolerant per cardiology H&P  LDL not ordered, goal < 70  History of statin intolerance  Continue  statin at discharge    Other Stroke Risk Factors  Advanced age  The patient quit smoking 48 years ago.  Coronary artery disease   Other Active Problems  Mild anemia  Hyponatremia - 133  Mild hyperglycemia - await hemoglobin A1c   Hospital day # 2    I have personally examined this patient, reviewed notes, independently viewed imaging studies, participated in medical decision making and plan of care.ROS completed by me personally and pertinent positives fully documented  I have made any additions or clarifications directly to the above note. Agree with note above. History is unclear but seems like presyncopal episode with orthostatic hypotension followed by transient disorientation and garbled speech but MRI shows tiny thalamic infarct likely unrelated from small vessel disease  . I had a long discussion the patient and family about orthostatic hypertension and asked him to do orthostatic tolerance exercises and to wear stockings and TED hoses. Cardiology plan to use midodrine fororthostatic hypotension. Discussed with Dr. Johnsie Cancel. Greater than 50% of time during this 25 minute visit was spent on counseling and coordination of care about stroke and he address, prevention and treatment discussion. Stroke team will sign off. Call for questions.  Antony Contras, MD Medical Director [facility] Pager: [phone] 09/06/2016 1:19 PM   To contact Stroke Continuity provider, please refer to [email or web address] After hours, contact General Neurology

## 2016-09-06 NOTE — Evaluation (Signed)
Physical Therapy Evaluation Patient Details Name: Thomas Hanna MRN: [id number] DOB: 04/03/1924 Today's Date: 09/06/2016   History of Present Illness  83 y.o. male with a history of myocardial infarction, coronary artery disease and prostate cancer, admitted for evaluation and management of recurrent episodes of hypotension and loss of consciousness or near loss of consciousness. On admission, MRI revealed subacute thalamic infarct.   Clinical Impression  Pt admitted with above diagnosis. Pt currently with functional limitations due to the deficits listed below (see PT Problem List). On eval, pt required min guard assist with transfers and ambulation with RW 15 feet. Ambulation distance limited by symptomatic orthostatic hypotension. BP 94/47 after second attempt in room. He was unable to tolerate progression of gait into hallway.  Pt will benefit from skilled PT to increase their independence and safety with mobility to allow discharge to the venue listed below.       Follow Up Recommendations Home health PT;Supervision/Assistance - 24 hour    Equipment Recommendations  Rolling walker with 5" wheels    Recommendations for Other Services       Precautions / Restrictions Precautions Precautions: Fall;Other (comment) Precaution Comments: watch BP Restrictions Weight Bearing Restrictions: No      Mobility  Bed Mobility Overal bed mobility: Needs Assistance Bed Mobility: Supine to Sit     Supine to sit: Supervision     General bed mobility comments: HOB flat, +rail  Transfers Overall transfer level: Needs assistance Equipment used: Rolling walker (2 wheeled) Transfers: Sit to/from [facility] Sit to Stand: Min guard Stand pivot transfers: Min guard       General transfer comment: verbal cues for hand placement. Increased time between position changes.  Ambulation/Gait Ambulation/Gait assistance: Min guard Ambulation Distance (Feet): 15 Feet (x  2) Assistive device: Rolling walker (2 wheeled) Gait Pattern/deviations: Step-through pattern Gait velocity: mildly decreased   General Gait Details: O2 sats at 96% seated, decreasing to mid 80s with stance. BP after intial ambulation 105/51, then after second attempt 94/47.  Stairs            Wheelchair Mobility    Modified Rankin (Stroke Patients Only)       Balance Overall balance assessment: Needs assistance Sitting-balance support: No upper extremity supported;Feet supported Sitting balance-Leahy Scale: Good     Standing balance support: Bilateral upper extremity supported;During functional activity Standing balance-Leahy Scale: Fair Standing balance comment: RW needed for stability.                             Pertinent Vitals/Pain Pain Assessment: No/denies pain    Home Living Family/patient expects to be discharged to:: Private residence Living Arrangements: Children Available Help at Discharge: Family;Available 24 hours/day Type of Home: House Home Access: Stairs to enter Entrance Stairs-Rails: Psychiatric nurse of Steps: 3 Home Layout: Able to live on main level with bedroom/bathroom Home Equipment: None      Prior Function Level of Independence: Independent         Comments: Active. Drives to [facility] to visit friends and to the casinos in [place].     Hand Dominance        Extremity/Trunk Assessment   Upper Extremity Assessment Upper Extremity Assessment: Overall WFL for tasks assessed    Lower Extremity Assessment Lower Extremity Assessment: Overall WFL for tasks assessed    Cervical / Trunk Assessment Cervical / Trunk Assessment: Normal  Communication   Communication: No difficulties  Cognition Arousal/Alertness: Awake/alert  Behavior During Therapy: WFL for tasks assessed/performed Overall Cognitive Status: Impaired/Different from baseline Area of Impairment: Orientation;Safety/judgement;Following  commands;Problem solving Orientation Level: Disoriented to;Time;Situation     Following Commands: Follows multi-step commands inconsistently Safety/Judgement: Decreased awareness of safety;Decreased awareness of deficits   Problem Solving: Slow processing;Difficulty sequencing;Requires verbal cues General Comments: Onset of confusion with mobility in correlation with hypotension.    General Comments      Exercises Other Exercises Other Exercises: Seated heel raises bilat prior to stance to assist with orthostatic hypotension.   Assessment/Plan    PT Assessment Patient needs continued PT services  PT Problem List Decreased strength;Decreased activity tolerance;Decreased balance;Decreased knowledge of use of DME;Decreased cognition;Decreased mobility;Decreased safety awareness;Decreased knowledge of precautions;Cardiopulmonary status limiting activity          PT Treatment Interventions DME instruction;Gait training;Stair training;Functional mobility training;Balance training;Therapeutic exercise;Therapeutic activities;Patient/family education    PT Goals (Current goals can be found in the Care Plan section)  Acute Rehab PT Goals Patient Stated Goal: be able to drive to [place], [place]. PT Goal Formulation: With patient/family Time For Goal Achievement: 09/20/16 Potential to Achieve Goals: Good    Frequency Min 3X/week   Barriers to discharge        Co-evaluation               End of Session Equipment Utilized During Treatment: Gait belt Activity Tolerance: Patient tolerated treatment well Patient left: in chair;with chair alarm set;with family/visitor present;with call bell/phone within reach Nurse Communication: Mobility status;Other (comment) (BP)         Time: [id number] PT Time Calculation (min) (ACUTE ONLY): 36 min   Charges:   PT Evaluation $PT Eval Moderate Complexity: 1 Procedure PT Treatments $Gait Training: 8-22 mins   PT G Codes:         Lorriane Shire 09/06/2016, 10:36 AM

## 2016-09-06 NOTE — Addendum Note (Signed)
Addended by: Waylan Rocher on: 09/06/2016 07:41 AM   Modules accepted: Orders

## 2016-09-07 NOTE — Progress Notes (Signed)
Subjective:  Patient currently lying in bed this morning.  PT evaluation yesterday unable to ambulate in hall with blood pressure 94/47.  Appears that he was not started on midodrine and first dose was last night.  He feels okay this morning.  I spoke with the son over the phone and the patient will remain here over the weekend for further physical therapy.  Objective:  Vital Signs in the last 24 hours: BP (!) 133/56 (BP Location: Left Arm)   Pulse 62   Temp 99.4 F (37.4 C) (Oral)   Resp 20   Ht 5\' 11"  (1.803 m)   Wt 75.1 kg (165 lb 8 oz)   SpO2 100%   BMI 23.08 kg/m   Physical Exam: Elderly male in no acute distress Lungs:  Clear  Cardiac:  Regular rhythm, normal S1 and S2, no S3 Extremities:  No edema present  Intake/Output from previous day: 01/26 0701 - 01/27 0700 In: 840 [P.O.:840] Out: 2775 [Urine:2775] Weight Filed Weights   09/03/16 2220  Weight: 75.1 kg (165 lb 8 oz)    Lab Results: Basic Metabolic Panel:  Recent Labs  09/06/16 0908  NA 134*  K 4.3  CL 101  CO2 25  GLUCOSE 110*  BUN 13  CREATININE 0.76   Telemetry: Personally reviewed Sinus rhythm  Assessment/Plan:  1.  Symptomatic orthostatic hypotension 2.  Subacute thalamic infarct 3.  Carotid artery disease treated medically  Recommendations:  He has not been getting orthostatic vital signs and I will order.  We'll place thigh-high venous support stockings and have him sit up in the chair is bed rest will exacerbate orthostasis.  Not ready to go home this weekend due to need for physical therapy and need for ambulation.  He needs to fluid load when he takes the midodrine.      Kerry Hough  MD [facility] Card[facility]  09/07/2016, 9:26 AM

## 2016-09-08 NOTE — Progress Notes (Signed)
Physical Therapy Treatment Patient Details Name: Thomas Hanna MRN: [id number] DOB: October 17, 1923 Today's Date: 09/08/2016    History of Present Illness 83 y.o. male with a history of myocardial infarction, coronary artery disease and prostate cancer, admitted for evaluation and management of recurrent episodes of hypotension and loss of consciousness or near loss of consciousness. On admission, MRI revealed subacute thalamic infarct.     PT Comments    Patient with much improved mobility and gait.  Able to ambulate 500' with RW and min guard assist with no lightheadedness or dizziness.  Patient with decreased balance and safety during gait.  Agree with need for HHPT at d/c.   Follow Up Recommendations  Home health PT;Supervision/Assistance - 24 hour     Equipment Recommendations  Rolling walker with 5" wheels    Recommendations for Other Services       Precautions / Restrictions Precautions Precautions: Fall;Other (comment) Precaution Comments: watch BP Restrictions Weight Bearing Restrictions: No    Mobility  Bed Mobility               General bed mobility comments: Patient in chair  Transfers Overall transfer level: Needs assistance Equipment used: Rolling walker (2 wheeled) Transfers: Sit to/from Stand Sit to Stand: Supervision         General transfer comment: Verbal cues for hand placement and technique for sit <> stand.  Assist for safety.  Ambulation/Gait Ambulation/Gait assistance: Min guard Ambulation Distance (Feet): 500 Feet (350' and 150' with seated rest) Assistive device: Rolling walker (2 wheeled) Gait Pattern/deviations: Step-through pattern;Decreased stride length;Trunk flexed Gait velocity: mildly decreased Gait velocity interpretation: Below normal speed for age/gender General Gait Details: Cues to stand upright.  Patient easily distracted looking into rooms and at people/objects in hallway, making him run into baseboard or obstacle in  hallway.  Cues to look in direction walking to avoid hitting obstacles.  Elevated RW to encourage more upright posture.   Stairs            Wheelchair Mobility    Modified Rankin (Stroke Patients Only)       Balance           Standing balance support: Bilateral upper extremity supported;During functional activity Standing balance-Leahy Scale: Poor Standing balance comment: RW needed for stability.                    Cognition Arousal/Alertness: Awake/alert Behavior During Therapy: WFL for tasks assessed/performed Overall Cognitive Status: Impaired/Different from baseline Area of Impairment: Safety/judgement;Problem solving         Safety/Judgement: Decreased awareness of safety;Decreased awareness of deficits   Problem Solving: Slow processing;Difficulty sequencing;Requires verbal cues General Comments: Difficulty problem-solving safe technique to return to sitting with RW - required repeated verbal cues.    Exercises      General Comments        Pertinent Vitals/Pain Pain Assessment: No/denies pain    Home Living                      Prior Function            PT Goals (current goals can now be found in the care plan section) Progress towards PT goals: Progressing toward goals    Frequency    Min 3X/week      PT Plan Current plan remains appropriate    Co-evaluation             End of Session Equipment Utilized During Treatment: Gait  belt Activity Tolerance: Patient tolerated treatment well (No lightheadedness or dizziness during gait.) Patient left: in chair;with call bell/phone within reach;with chair alarm set     Time: [id number] PT Time Calculation (min) (ACUTE ONLY): 26 min  Charges:  $Gait Training: 23-37 mins                    G Codes:      Thomas Hanna Sep 11, 2016, 4:13 PM Thomas Hanna. Thomas Hanna, Thomas Hanna Pager [phone]

## 2016-09-08 NOTE — Progress Notes (Signed)
Subjective:  Patient currently lying in bed this morning. Feeling better and orthostatics improved with support stocking and midodrine. PT did not see yesterday, but nursing ambulated to station. No chest pain or SOB.   Objective:  Vital Signs in the last 24 hours: BP (!) 110/49 (BP Location: Left Arm)   Pulse 66   Temp 98.1 F (36.7 C) (Oral)   Resp 16   Ht 5\' 11"  (1.803 m)   Wt 75.1 kg (165 lb 8 oz)   SpO2 95%   BMI 23.08 kg/m   Orthostatic VS for the past 24 hrs:  BP- Lying Pulse- Lying BP- Sitting Pulse- Sitting BP- Standing at 0 minutes Pulse- Standing at 0 minutes  09/07/16 1828 122/50 63 121/60 59 141/57 70  09/07/16 1731 122/50 63 121/60 59 141/57 70  09/07/16 0931 117/52 74 116/63 83 (!) 89/46 95   Physical Exam: Elderly male in no acute distress Lungs:  Clear  Cardiac:  Regular rhythm, normal S1 and S2, no S3 Extremities:  No edema present  Intake/Output from previous day: 01/27 0701 - 01/28 0700 In: -  Out: 500 [Urine:500] Weight Filed Weights   09/03/16 2220  Weight: 75.1 kg (165 lb 8 oz)    Lab Results: Basic Metabolic Panel:  Recent Labs  09/06/16 0908  NA 134*  K 4.3  CL 101  CO2 25  GLUCOSE 110*  BUN 13  CREATININE 0.76   Telemetry: Personally reviewed Sinus rhythm  Assessment/Plan:  1.  Symptomatic orthostatic hypotension improved 2.  Subacute thalamic infarct 3.  Carotid artery disease treated medically  Recommendations:  Improved orthostatics.  Await PT eval and final recs, but should be able to go home in am.  Will d/c condom catheter and encourage up in chair and ambulation.   Kerry Hough  MD [facility] Card[facility]  09/08/2016, 9:04 AM

## 2016-09-08 NOTE — Progress Notes (Addendum)
PT Cancellation Note - Late Note Entry  Patient Details Name: Thomas Hanna MRN: [id number] DOB: 08-02-1924   Cancelled Treatment:     Attempted to see patient.  RN reports they just returned from ambulation to nursing station.  Will return tomorrow for PT session.   Despina Pole 09/08/2016, 4:15 PM Carita Pian. Sanjuana Kava, [place] Pager [phone]

## 2016-09-08 NOTE — Discharge Instructions (Signed)
Midodrine tablets What is this medicine? MIDODRINE (MI doe dreen) is used to treat low blood pressure in patients who have symptoms like dizziness when going from a sitting to a standing position. This medicine may be used for other purposes; ask your health care provider or pharmacist if you have questions. COMMON BRAND NAME(S): Orvaten, ProAmatine What should I tell my health care provider before I take this medicine? They need to know if you have any of the following conditions: -difficulty passing urine -heart disease -high blood pressure -kidney disease -over active thyroid -pheochromocytoma -an unusual or allergic reaction to midodrine, other medicines, foods, dyes, or preservatives -pregnant or trying to get pregnant -breast-feeding How should I use this medicine? Take this medicine by mouth with a glass of water. Follow the directions on the prescription label. The last dose of this medicine should not be taken after the evening meal or less than 4 hours before bedtime. When you lie down for any length of time after taking this medicine, high blood pressure can occur. Do not take this medicine if you will be lying down for any length of time. Do not take your medicine more often than directed. Do not stop taking except on your doctor's advice. Talk to your pediatrician regarding the use of this medicine in children. Special care may be needed. Overdosage: If you think you have taken too much of this medicine contact a poison control center or emergency room at once. NOTE: This medicine is only for you. Do not share this medicine with others. What if I miss a dose? If you miss a dose, take it as soon as you can. If it is almost time for your next dose, take only that dose. Do not take double or extra doses. What may interact with this medicine? Do not take this medicine with any of the following medications: -MAOIs like Carbex, Eldepryl, Marplan, Nardil, and Parnate -medicines called  ergot alkaloids -medicines for colds and breathing difficulties or weight loss -procarbazine This medicine may also interact with the following medications: -cimetidine -digoxin -flecainide -fludrocortisone -metformin -procainamide -quinidine -ranitidine -triamterene -medicines called alpha-blockers like doxazosin, prazosin, and terazosin This list may not describe all possible interactions. Give your health care provider a list of all the medicines, herbs, non-prescription drugs, or dietary supplements you use. Also tell them if you smoke, drink alcohol, or use illegal drugs. Some items may interact with your medicine. What should I watch for while using this medicine? Visit your doctor or health care professional for regular checks on your progress. You may get drowsy or dizzy. Do not drive, use machinery, or do anything that needs mental alertness until you know how this medicine affects you. Do not stand or sit up quickly, especially if you are an older patient. This reduces the risk of dizzy or fainting spells. Your mouth may get dry. Chewing sugarless gum or sucking hard candy, and drinking plenty of water may help. Contact your doctor if the problem does not go away or is severe. Do not treat yourself for coughs, colds, or pain while you are taking this medicine without asking your doctor or health care professional for advice. Some ingredients may increase your blood pressure. What side effects may I notice from receiving this medicine? Side effects that you should report to your doctor or health care professional as soon as possible: -awareness of heart beating -blurred vision -headache -irregular heartbeat, palpitations, or chest pain -pounding in the ears -skin rash, hives Side effects that  usually do not require medical attention (report to your doctor or health care professional if they continue or are bothersome): -change in heart rate -chills -goose bumps -increased  need to urinate -itching -stomach pain -tingling in the skin or scalp This list may not describe all possible side effects. Call your doctor for medical advice about side effects. You may report side effects to FDA at 1-800-FDA-1088. Where should I keep my medicine? Keep out of the reach of children. Store at room temperature between 15 and 30 degrees C (59 and 86 degrees F). Throw away any unused medicine after the expiration date. NOTE: This sheet is a summary. It may not cover all possible information. If you have questions about this medicine, talk to your doctor, pharmacist, or health care provider.  2017 Elsevier/Gold Standard (2008-02-15 13:51:24)

## 2016-09-09 ENCOUNTER — Ambulatory Visit: Payer: Medicare Other | Admitting: Osteopathic Medicine

## 2016-09-09 ENCOUNTER — Ambulatory Visit: Payer: Medicare Other

## 2016-09-09 MED ORDER — MIDODRINE HCL 5 MG PO TABS: 5.0000 mg | ORAL_TABLET | Freq: Three times a day (TID) | ORAL | 3 refills | Status: DC

## 2016-09-09 NOTE — Care Management Note (Signed)
Case Management Note  Patient Details  Name: Thomas Hanna MRN: [id number] Date of Birth: 1924-07-14  Subjective/Objective:                    Action/Plan: Pt discharging home with his son. Pt with orders for [facility] services. CM provided the patient and his son a list of [place] agencies. They selected [facility]. Karolee Stamps with [facility] notified and accepted the referral. Pt also with orders for a walker. Brad with [facility] DME notified and will deliver the equipment to the room. Son providing transportation home.   Expected Discharge Date:  09/09/16               Expected Discharge Plan:  [place]  In-House Referral:     Discharge planning Services  CM Consult  Post Acute Care Choice:  Durable Medical Equipment, Home Health Choice offered to:  Patient, Adult Children  DME Arranged:  Walker rolling DME Agency:  [place] Arranged:  PT [place]:  [place]  Status of Service:  Completed, signed off  If discussed at [place] of Stay Meetings, dates discussed:    Additional Comments:  Pollie Friar, RN 09/09/2016, 2:18 PM

## 2016-09-09 NOTE — Care Management Important Message (Signed)
Important Message  Patient Details  Name: Thomas Hanna MRN: [id number] Date of Birth: 12/09/1923   Medicare Important Message Given:  Yes    Shanee Batch 09/09/2016, 1:49 PM

## 2016-09-09 NOTE — Discharge Summary (Signed)
Discharge Summary    Patient ID: Thomas Hanna,  MRN: [id number], DOB/AGE: [id number] 81 y.o.  Admit date: 09/03/2016 Discharge date: 09/09/2016  Primary Care Provider: Emeterio Reeve Primary Cardiologist: Dr Claiborne Billings  Discharge Diagnoses    Active Problems:   Orthostatic hypotension   Allergies Allergies  Allergen Reactions  . Sulfa Antibiotics Other (See Comments)    Unknown    Diagnostic Studies/Procedures    Carotids performed in office with 1-39% RICA and [id number] LICA stenosis Radiology reports below _____________   History of Present Illness      CABG in 2007, s/p PCI in 2013 and 2014 in [place] (Dr Augusto Garbe), inactive prostate CA. Admitted 01/17-01/18 for syncope in setting of dehydration, EF normal. Seen in office 01/23 and SBP noted 80s when pt upright and associated w/ MS changes felt 2nd cerebral ischemia. Pt admitted for treatment and neuro consult.   Hospital Course     Consultants: Neurology   His CT did not show anything acute, but an MRI showed a subacute infarct. He was seen by Dr Nicole Kindred with Neurology who felt he might have a neuropathy. He was evaluated by PT and [facility] PT was felt to be ok. He was hydrated and his ARB was held. TSH and cortisol were ok. Moderate cerebrovascular disease, to be treated medically. He is on Plavix. Midodrine was started to help with the hypotension and he slowly improved.   Neurology recommended 6-8 week f/u MRI with contrast. "Please note for assessment of intracranial hypotension, contrast enhanced sequences would be indicated as well as correlation with lumbar puncture opening pressures." L-PCA was thrombosed on CT.   On 01/29, pt seen by Dr Meda Coffee and all data were reviewed. He was ambulating without much change in his BP, and felt much improved. PT felt he could go home. No further inpatient workup was indicated and he is considered stable for discharge, to follow up closely in the office.   _____________  Discharge Vitals Blood pressure (!) 110/50, pulse 72, temperature 97.7 F (36.5 C), temperature source Oral, resp. rate 18, height 5\' 11"  (1.803 m), weight 165 lb 8 oz (75.1 kg), SpO2 98 %.  Filed Weights   09/03/16 2220  Weight: 165 lb 8 oz (75.1 kg)    Labs & Radiologic Studies    CBC Lab Results  Component Value Date   WBC 8.1 09/03/2016   HGB 11.1 (L) 09/03/2016   HCT 33.9 (L) 09/03/2016   MCV 88.1 09/03/2016   PLT 266 0000000    Basic Metabolic Panel BMET    Component Value Date/Time   NA 134 (L) 09/06/2016 0908   K 4.3 09/06/2016 0908   CL 101 09/06/2016 0908   CO2 25 09/06/2016 0908   GLUCOSE 110 (H) 09/06/2016 0908   BUN 13 09/06/2016 0908   CREATININE 0.76 09/06/2016 0908   CALCIUM 9.0 09/06/2016 0908   GFRNONAA >60 09/06/2016 0908   GFRAA >60 09/06/2016 0908    Liver Function Tests Lab Results  Component Value Date   ALT 16 (L) 09/03/2016   AST 23 09/03/2016   ALKPHOS 58 09/03/2016   BILITOT 0.9 09/03/2016   Cardiac Enzymes Lab Results  Component Value Date   TROPONINI <0.03 08/29/2016   Hemoglobin A1C Lab Results  Component Value Date   HGBA1C 5.7 (H) 09/05/2016   Thyroid Function Tests Lab Results  Component Value Date   TSH 2.422 09/04/2016    _____________  Dg Chest 2 View Result Date: 09/03/2016 CLINICAL  DATA:  Syncopal episode. Orthostatics hypotension. Lethargy. History of coronary artery disease, previous MI, stent placement, and CABG. EXAM: CHEST  2 VIEW COMPARISON:  PA and lateral chest x-ray of August 28, 2016 FINDINGS: The lungs remain hyperinflated. There is no focal infiltrate. There is no pleural effusion, pneumothorax, or pneumomediastinum. The heart and pulmonary vascularity are normal. There is calcification in the wall of the thoracic aorta. There are post CABG changes. The sternal wires are intact. The bony thorax exhibits no acute abnormality. IMPRESSION: COPD. Previous CABG. No pneumonia, CHF, nor  other acute cardiopulmonary abnormality. Thoracic aortic atherosclerosis. Electronically Signed   By: David  Martinique M.D.   On: 09/03/2016 14:26   Ct Head Wo Contrast Result Date: 09/03/2016 CLINICAL DATA:  Dizziness, altered mental status, possible TIAs EXAM: CT HEAD WITHOUT CONTRAST TECHNIQUE: Contiguous axial images were obtained from the base of the skull through the vertex without intravenous contrast. COMPARISON:  08/28/2016 FINDINGS: Brain: No evidence of acute infarction, hemorrhage, hydrocephalus, extra-axial collection or mass lesion/mass effect. Subcortical white matter and periventricular small vessel ischemic changes. Mild cortical and central atrophy. Secondary ventricular prominence. Vascular: Intracranial atherosclerosis. Skull: Normal. Negative for fracture or focal lesion. Sinuses/Orbits: The visualized paranasal sinuses are essentially clear. The mastoid air cells are unopacified. Other: None. IMPRESSION: No evidence of acute intracranial abnormality. Atrophy with small vessel ischemic changes. No interval change from recent CT. Electronically Signed   By: Julian Hy M.D.   On: 09/03/2016 14:14   Ct Angio Neck W Or Wo Contrast Result Date: 09/04/2016 CLINICAL DATA:  83 year old male with dizziness and altered mental status found have left thalamic abnormality on MRI favored to be subacute infarct rather than mass. Initial encounter. EXAM: CT ANGIOGRAPHY HEAD AND NECK TECHNIQUE: Multidetector CT imaging of the head and neck was performed using the standard protocol during bolus administration of intravenous contrast. Multiplanar CT image reconstructions and MIPs were obtained to evaluate the vascular anatomy. Carotid stenosis measurements (when applicable) are obtained utilizing NASCET criteria, using the distal internal carotid diameter as the denominator. CONTRAST:  50 mL Isovue 370 COMPARISON:  Brain MRI and noncontrast head CT 09/03/2016. FINDINGS: CT HEAD Brain: Unchanged  noncontrast CT appearance of the left thalamus. Stable gray-white matter differentiation throughout the brain. No acute intracranial hemorrhage identified. No midline shift, mass effect, or evidence of intracranial mass lesion. No ventriculomegaly. Calvarium and skull base: No acute osseous abnormality identified. Paranasal sinuses: Visualized paranasal sinuses and mastoids are stable and well pneumatized. Orbits: No acute orbit or scalp soft tissue finding. CTA NECK Skeleton: Osteopenia. Degenerative changes in the spine. Prior median sternotomy. No acute osseous abnormality identified. Upper chest: Right upper lobe peribronchial thickening. Mild dependent atelectasis. No superior mediastinal lymphadenopathy. Other neck: Negative thyroid, larynx, pharynx, parapharyngeal spaces, retropharyngeal space, sublingual space, submandibular glands and parotid glands. No cervical lymphadenopathy. Aortic arch: Extensive calcified aortic atherosclerosis. Bovine type arch configuration. Right carotid system: No brachiocephalic artery or right CCA origin stenosis. The right CCA origin is mildly tortuous with a kinked appearance. Intermittent plaque in the right CCA without stenosis proximal to the bifurcation. More pronounced soft and calcified plaque at the right carotid bifurcation continuing into the right ICA origin and bulb. At the level of the distal bulb there is significant stenosis which is estimated at 65-70 % stenosis with respect to the distal vessel (series 12, image 221). There is some additional calcified plaque in the more distal right ICA in the neck but no other stenosis. Left carotid system: Bovine type  left CCA origin with no stenosis despite soft and calcified plaque. Intermittent soft and calcified plaque in the left carotid proximal to the bifurcation without stenosis. Bulky calcified plaque at the left carotid bifurcation continuing into the left ICA origin and bulb. Maximal stenosis appears at the left  ICA origin and is estimated at 65-70 % with respect to the distal vessel. Minimal calcified plaque distal to the bulb then no additional cervical ICA stenosis. Vertebral arteries: Tortuous right subclavian artery origin with soft and calcified plaque and a kinked appearance. Calcified plaque at the right vertebral artery origin with mild to moderate stenosis. Otherwise negative right vertebral artery to the skullbase. Soft and calcified plaque in the proximal left subclavian artery without hemodynamically significant stenosis. Calcified plaque at the left vertebral artery origin but this does not appear hemodynamically significant. Fairly codominant vertebral arteries, the left is slightly larger. Normal left vertebral artery otherwise to the skullbase. CTA HEAD Posterior circulation: Dominant distal left vertebral artery is normal to the vertebrobasilar junction. Normal left PICA origin. Right V4 segment calcified plaque with mild stenosis. The distal right vertebral artery appears non dominant but remains patent and to the right PICA origin is normal. There is mild basilar artery irregularity without significant stenosis. There are is a fetal type right PCA origin. Both SCA origins are patent. It is unclear whether oval left PCA origin is partially fetal, but if so the left posterior communicating artery is occluded (series 12, image 106). There is a diminutive but patent left P1 segment (series 13, image 116). The more distal left P1 and the remaining left PCA are then within normal limits. Right PCA branches are within normal limits. Anterior circulation: Both ICA siphons are patent. There is moderate cavernous and moderate to severe supraclinoid calcified plaque. There is bilateral moderate to severe supraclinoid ICA stenosis. Both ophthalmic and a right posterior communicating artery origins are normal. There does appear to be a left posterior communicating artery which is occluded just beyond its origin  (image 106 as stated above). Both carotid termini are patent. The MCA and ACA origins are patent. The anterior communicating artery and bilateral ACA branches are within normal limits. The left MCA M1 segment, bifurcation, and left MCA branches are within normal limits. The right MCA M1 segment, bifurcation, and right MCA branches are within normal limits. Venous sinuses: Patent. Anatomic variants: Bovine type arch configuration. Mildly dominant left vertebral artery. Fetal type right, and probably also left PCA origins. Delayed phase: No abnormal enhancement identified. Review of the MIP images confirms the above findings IMPRESSION: 1. With regard to the left thalamic finding there is strong evidence of a mostly thrombosed fetal origin of the Left PCA (series 12, image 106). However, the more distal P1 and remaining left PCA remain normal. This appearance could be expected to cause Left Thalamostriate Artery territory ischemia, favoring infarct as the etiology of the described MRI signal abnormality. Still, an 8-12 week follow-up repeat MRI would be complementary to confirm expected post ischemic evolution. 2. Positive also for hemodynamically significant Bilateral proximal ICA (numerically estimated at 65-70%) and Supraclinoid ICA atherosclerotic stenoses related to bulky calcified plaque. 3. Otherwise negative anterior circulation. 4. Mild bilateral vertebral artery calcified plaque without hemodynamically significant stenosis. 5. Stable CT appearance of the brain since yesterday. Electronically Signed   By: Genevie Ann M.D.   On: 09/04/2016 16:53   [name] Result Date: 09/03/2016 CLINICAL DATA:  Found unresponsive at desk. Dehydrated. Recent upper respiratory tract infection.  Now disoriented, week with garbled speech and confusion. Symptoms improved when supine. History of prostate cancer. EXAM: MRI HEAD WITHOUT CONTRAST TECHNIQUE: Multiplanar, multiecho pulse sequences of the brain and surrounding  structures were obtained without intravenous contrast. COMPARISON:  CT HEAD September 03, 2016 at 1401 hours FINDINGS: BRAIN: Faint reduced diffusion associated with the T2 bright 14 mm lesion LEFT mesial thalamus, normalized ADC values. No mass effect. Ventricles and sulci are normal for patient's age. Patchy to confluent supratentorial white matter FLAIR T2 hyperintensities. No susceptibility artifact to suggest hemorrhage. No midline shift. No abnormal extra-axial fluid collections. VASCULAR: Normal major intracranial vascular flow voids present at skull base. SKULL AND UPPER CERVICAL SPINE: No abnormal sellar expansion. No suspicious calvarial bone marrow signal. Craniocervical junction maintained. SINUSES/ORBITS: Trace paranasal sinus mucosal thickening. Mastoid air cells are well aerated. Status post bilateral ocular lens implants. The included ocular globes and orbital contents are non-suspicious. OTHER: None. IMPRESSION: 14 mm LEFT thalamus subacute infarct, less likely mass. Recommend 6-8 week follow-up MRI of the head with contrast. Moderate chronic small vessel ischemic disease. Please note for assessment of intracranial hypotension, contrast enhanced sequences would be indicated as well as correlation with lumbar puncture opening pressures. Electronically Signed   By: Elon Alas M.D.   On: 09/03/2016 19:57   Disposition   Pt is being discharged home today in good condition.  Follow-up Plans & Appointments    Follow-up Information    Rashika Bettes, Suanne Marker, PA-C Follow up on 09/13/2016.   Specialties:  Cardiology, Radiology Why:  Please arrive 15 minutes early for appointment. Contact information: [address] [place] [address] [phone]          Discharge Instructions    Diet - low sodium heart healthy    Complete by:  As directed    Increase activity slowly    Complete by:  As directed       Discharge Medications   Current Discharge Medication List     START taking these medications   Details  midodrine (PROAMATINE) 5 MG tablet Take 1 tablet (5 mg total) by mouth 3 (three) times daily with meals. Qty: 90 tablet, Refills: 3      CONTINUE these medications which have NOT CHANGED   Details  cholecalciferol (VITAMIN D) 1000 units tablet Take 1,000 Units by mouth daily.    clopidogrel (PLAVIX) 75 MG tablet Take 75 mg by mouth daily.    dorzolamide (TRUSOPT) 2 % ophthalmic solution Place 1 drop into both eyes 2 (two) times daily.    gabapentin (NEURONTIN) 100 MG capsule Take 600 mg by mouth 3 (three) times daily.     latanoprost (XALATAN) 0.005 % ophthalmic solution Place 1 drop into both eyes at bedtime.     tamsulosin (FLOMAX) 0.4 MG CAPS capsule Take 0.4 mg by mouth.    vitamin B-12 (CYANOCOBALAMIN) 100 MCG tablet Take 100 mcg by mouth daily.      STOP taking these medications     aspirin EC 81 MG tablet      losartan (COZAAR) 25 MG tablet         Outstanding Labs/Studies     Duration of Discharge Encounter   Greater than 30 minutes including physician time.  Jonetta Speak NP 09/09/2016, 1:43 PM

## 2016-09-09 NOTE — Progress Notes (Signed)
Orthopedic Tech Progress Note Patient Details:  Thomas Hanna [birth year] [id number]  Ortho Devices Type of Ortho Device: Abdominal binder Ortho Device/Splint Interventions: Application   [name] 09/09/2016, 2:05 PM

## 2016-09-09 NOTE — Progress Notes (Signed)
Patient Name: Thomas Hanna Date of Encounter: 09/09/2016  Active Problems:   Orthostatic hypotension   Length of Stay: 5  SUBJECTIVE  The patient feels ok. No dizziness.   CURRENT MEDS . clopidogrel  75 mg Oral Daily  . dorzolamide  1 drop Both Eyes BID  . gabapentin  600 mg Oral TID  . latanoprost  1 drop Both Eyes QHS  . midodrine  5 mg Oral TID WC  . sodium chloride flush  3 mL Intravenous Q12H  . tamsulosin  0.4 mg Oral QHS   OBJECTIVE  Vitals:   09/08/16 2108 09/09/16 0045 09/09/16 0533 09/09/16 0942  BP: (!) 145/63 116/84 (!) 137/51 (!) 110/50  Pulse: (!) 57 76 62 72  Resp: 18 18 18 18   Temp: 98 F (36.7 C) 98 F (36.7 C) 97.7 F (36.5 C)   TempSrc: Oral Oral Oral Oral  SpO2: 96% 98% 100% 98%  Weight:      Height:        Intake/Output Summary (Last 24 hours) at 09/09/16 1128 Last data filed at 09/09/16 0755  Gross per 24 hour  Intake                0 ml  Output             1550 ml  Net            -1550 ml   Filed Weights   09/03/16 2220  Weight: 165 lb 8 oz (75.1 kg)   PHYSICAL EXAM  General: Pleasant, NAD. Neuro: Alert and oriented X 3. Moves all extremities spontaneously. Psych: Normal affect. HEENT:  Normal  Neck: Supple without bruits or JVD. Lungs:  Resp regular and unlabored, CTA. Heart: RRR no s3, s4, or murmurs. Abdomen: Soft, non-tender, non-distended, BS + x 4.  Extremities: No clubbing, cyanosis or edema. DP/PT/Radials 2+ and equal bilaterally.  Accessory Clinical Findings  Radiology/Studies  Dg Chest 2 View  Result Date: 08/28/2016 CLINICAL DATA:  Syncope. Marland Kitchen IMPRESSION: 1. Mild cardiomegaly.  No evidence of CHF. 2. Hyperexpanded lungs suggesting COPD/emphysema. 3. Lungs are clear.  No evidence of pneumonia or pulmonary edema. 4. Aortic atherosclerosis. Electronically Signed   By: Franki Cabot M.D.   On: 08/28/2016 22:42   Ct Head Wo Contrast  Result Date: 08/28/2016 CLINICAL DATA:  Altered mental status.  IMPRESSION: 1.  No acute findings.  No intracranial mass, hemorrhage or edema. 2. Chronic microvascular ischemic changes in the white matter, mild in degree for age. Electronically Signed   By: Franki Cabot M.D.   On: 08/28/2016 20:58   Ct Angio Neck W Or Wo Contrast  Result Date: 09/04/2016 CLINICAL DATA:  84 year old male with dizziness and altered mental status found have left thalamic abnormality on MRI favored to be subacute infarct rather than mass. IMPRESSION: 1. With regard to the left thalamic finding there is strong evidence of a mostly thrombosed fetal origin of the Left PCA (series 12, image 106). However, the more distal P1 and remaining left PCA remain normal. This appearance could be expected to cause Left Thalamostriate Artery territory ischemia, favoring infarct as the etiology of the described MRI signal abnormality. Still, an 8-12 week follow-up repeat MRI would be complementary to confirm expected post ischemic evolution. 2. Positive also for hemodynamically significant Bilateral proximal ICA (numerically estimated at 65-70%) and Supraclinoid ICA atherosclerotic stenoses related to bulky calcified plaque. 3. Otherwise negative anterior circulation. 4. Mild bilateral vertebral artery calcified plaque without hemodynamically significant  stenosis. 5. Stable CT appearance of the brain since yesterday. Electronically Signed   By: Genevie Ann M.D.   On: 09/04/2016 16:53   [name]  Result Date: 09/03/2016 CLINICAL DATA:  Found unresponsive at desk. Dehydrated. Recent upper respiratory tract infection. Now disoriented, week with garbled speech and confusion. Symptoms improved when supine. History of prostate cancer. IMPRESSION: 14 mm LEFT thalamus subacute infarct, less likely mass. Recommend 6-8 week follow-up MRI of the head with contrast. Moderate chronic small vessel ischemic disease. Please note for assessment of intracranial hypotension, contrast enhanced sequences would be indicated as well as  correlation with lumbar puncture opening pressures. Electronically Signed   By: Elon Alas M.D.   On: 09/03/2016 19:57   TELE: SR    ASSESSMENT AND PLAN  1.  Symptomatic orthostatic hypotension improved 2.  Subacute thalamic infarct 3.  Carotid artery disease treated medically  Recommendations:  The patient was walking with PT, BP dropped by 16, no more slurred speech. I have talked to the family and they agree with discharge today and follow up on Thursday or Friday this week. If he continues to be orthostatic, we will add fludrocortisone 0.1 mg po daily. He has a follow up with neurology as well.   Signed, Ena Dawley MD, [facility] 09/09/2016

## 2016-09-09 NOTE — Progress Notes (Signed)
Patient ready for discharge to home; discharge instructions given and reviewed with patient and his son present at the bedside; Rx given; patient discharged out via wheelchair accompanied by his family; patient received rolling walker prior to discharge.

## 2016-09-09 NOTE — Progress Notes (Signed)
Physical Therapy Treatment Patient Details Name: Thomas Hanna MRN: [id number] DOB: 05/13/1924 Today's Date: 09/09/2016    History of Present Illness 83 y.o. male with a history of myocardial infarction, coronary artery disease and prostate cancer, admitted for evaluation and management of recurrent episodes of hypotension and loss of consciousness or near loss of consciousness. On admission, MRI revealed subacute thalamic infarct.     PT Comments    Pt reports being tired today but consented with stair training prior to d/c today. Pt needed moderate cuing for RW safety during transfers and turns because he kept wanting to pick it up to "not be in the way". Pt shows difficulty with problem solving and safe technique during stair climbing. Pt to be discharged today with HHPT and RW.   Orthostatics today: Reclined with feet elevated: 107/52 Sitting with feet in dependent pos: 101/48 Standing in RW: 104/48 Sitting post PT session: 126/46   Follow Up Recommendations  Home health PT;Supervision/Assistance - 24 hour     Equipment Recommendations  Rolling walker with 5" wheels    Recommendations for Other Services       Precautions / Restrictions Precautions Precautions: Fall;Other (comment) (Orthostatic hypotension) Precaution Comments: watch BP Required Braces or Orthoses: Other Brace/Splint ( ) Other Brace/Splint: Ted hose; ordered abdominal binder to assist with O.H. for pt but didn't arrive during tx. Restrictions Weight Bearing Restrictions: No    Mobility  Bed Mobility               General bed mobility comments: Patient in chair upon arrival  Transfers Overall transfer level: Needs assistance Equipment used: Rolling walker (2 wheeled) Transfers: Sit to/from Stand Sit to Stand: Min guard         General transfer comment: Verbal cues for hand placement and technique for sit <> stand.  Min guard for safety while taking BP  Ambulation/Gait Ambulation/Gait  assistance: Min guard Ambulation Distance (Feet): 60 Feet Assistive device: Rolling walker (2 wheeled) Gait Pattern/deviations: Step-through pattern;Decreased stride length;Trunk flexed     General Gait Details: cuing to stand upright and keep RW on floor during turns for safety. Easily distracted, making him run into objects.    Stairs Stairs: Yes   Stair Management: One rail Right;Sideways;Step to pattern Number of Stairs: 3 General stair comments: Son assisted with stair climbing to simulate for home. Pt showed difficulty with problem solving and safe sequencing without heavy cuing. Min guard for safety while son assisted ascend/ descend of stairs.   Wheelchair Mobility    Modified Rankin (Stroke Patients Only)       Balance Overall balance assessment: Needs assistance Sitting-balance support: Feet supported Sitting balance-Leahy Scale: Good     Standing balance support: Bilateral upper extremity supported;During functional activity Standing balance-Leahy Scale: Poor Standing balance comment: pt needs RW for stability. Easy to get off balance without something to hold onto.                     Cognition Arousal/Alertness: Awake/alert Behavior During Therapy: WFL for tasks assessed/performed Overall Cognitive Status: Impaired/Different from baseline Area of Impairment: Safety/judgement;Problem solving         Safety/Judgement: Decreased awareness of safety;Decreased awareness of deficits   Problem Solving: Slow processing;Difficulty sequencing;Requires verbal cues General Comments: requires heavy v/c with proper use of RW during transfer and ambulation    Exercises      General Comments        Pertinent Vitals/Pain Pain Assessment: No/denies pain  Home Living                      Prior Function            PT Goals (current goals can now be found in the care plan section) Acute Rehab PT Goals Patient Stated Goal: to go home PT Goal  Formulation: With patient/family Potential to Achieve Goals: Good Progress towards PT goals: Progressing toward goals    Frequency    Min 3X/week      PT Plan Current plan remains appropriate    Co-evaluation             End of Session Equipment Utilized During Treatment: Gait belt Activity Tolerance: Patient tolerated treatment well (reported feeling lightheaded in standing but quickly subsided. ) Patient left: in chair;with call bell/phone within reach;with chair alarm set;with family/visitor present     Time: [id number] PT Time Calculation (min) (ACUTE ONLY): 24 min  Charges:  $Gait Training: 8-22 mins $Therapeutic Activity: 8-22 mins                    G Codes:      [facility] 2016-09-11, 2:56 PM

## 2016-09-10 DIAGNOSIS — I951 Orthostatic hypotension: Secondary | ICD-10-CM | POA: Diagnosis not present

## 2016-09-10 DIAGNOSIS — I2510 Atherosclerotic heart disease of native coronary artery without angina pectoris: Secondary | ICD-10-CM | POA: Diagnosis not present

## 2016-09-13 ENCOUNTER — Ambulatory Visit (INDEPENDENT_AMBULATORY_CARE_PROVIDER_SITE_OTHER): Payer: Medicare Other | Admitting: Physician Assistant

## 2016-09-13 ENCOUNTER — Encounter: Payer: Self-pay | Admitting: Physician Assistant

## 2016-09-13 ENCOUNTER — Telehealth: Payer: Self-pay

## 2016-09-13 VITALS — BP 108/60 | HR 80 | Ht 71.5 in | Wt 169.0 lb

## 2016-09-13 DIAGNOSIS — I639 Cerebral infarction, unspecified: Secondary | ICD-10-CM

## 2016-09-13 DIAGNOSIS — I25810 Atherosclerosis of coronary artery bypass graft(s) without angina pectoris: Secondary | ICD-10-CM

## 2016-09-13 DIAGNOSIS — I10 Essential (primary) hypertension: Secondary | ICD-10-CM | POA: Insufficient documentation

## 2016-09-13 DIAGNOSIS — R930 Abnormal findings on diagnostic imaging of skull and head, not elsewhere classified: Secondary | ICD-10-CM

## 2016-09-13 DIAGNOSIS — I951 Orthostatic hypotension: Secondary | ICD-10-CM

## 2016-09-13 NOTE — Patient Instructions (Addendum)
Dr Leonie Man, Neurology [phone]  6-8 week follow-up MRI with contrast recommended. "Please note for assessment of intracranial hypotension, contrast enhanced sequences would be indicated as well as correlation with lumbar puncture opening pressures."   ----------------------------------- Your physician recommends that you continue on your current medications as directed. Please refer to the Current Medication list given to you today.  Please monitor BP at home - record on sheet provided  - let us know if your systolic BP (top #) is greater than [id number] and/or diastolic BP (bottom#) is greater than 90 consistently   Your physician wants you to follow-up in: 6 months with Dr. Claiborne Billings. You will receive a reminder letter in the mail two months in advance. If you don't receive a letter, please call our office to schedule the follow-up appointment.   HAPPY EARLY BIRTHDAY!

## 2016-09-13 NOTE — Telephone Encounter (Signed)
Ok to approve/order this.  Pt needs hospital followup appt also, if that's not something already in place.

## 2016-09-13 NOTE — Telephone Encounter (Signed)
LVM requesting pt call office.

## 2016-09-13 NOTE — Telephone Encounter (Signed)
Thomas Hanna was informed. Thomas Hanna stated that he will let the pt know that he needs to schedule a hospital follow-up.

## 2016-09-13 NOTE — Progress Notes (Signed)
Cardiology Office Note   Date:  09/13/2016   ID:  Thomas Hanna, DOB Dec 11, 1923, MRN [id number]  PCP:  Emeterio Reeve, DO  Cardiologist:  Dr. Meredeth Ide, PA-C   Chief Complaint  Patient presents with  . Follow-up    History of Present Illness: Thomas Hanna is a 83 y.o. male with a history of CABG in 2007, s/p PCI in 2013 and 2014 in [place] (Dr Augusto Garbe), inactive prostate CA. Admitted 01/17-01/18 for syncope in setting of dehydration, EF normal. Seen in office 01/23 and SBP noted 80s when pt upright and associated w/ MS changes felt 2nd cerebral ischemia. Pt admitted for treatment and neuro consult. Carotid dz w/ L-ICA 40-59%  Admit 01/23-01/29 for orthostatic hypotension, MRI showed subacute infarct, seen by neurology in 6-8 week follow-up MRI with contrast recommended. "Please note for assessment of intracranial hypotension, contrast enhanced sequences would be indicated as well as correlation with lumbar puncture opening pressures." L-PCA was thrombosed on CT.   Thomas Hanna presents for post-hospital follow up.His son and daughter-in-law are with him. He lives with them.  He is generally doing well. He has some memory loss, but that has been intermittent. Not having symptoms of low blood pressure at the time of the memory loss.  Before his acute illness, he was extremely active, driving to [place] when he felt like it and generally going somewhere every day to do something. Since he has been out of the hospital, he has not been driving. He has not been that active. He is using a walker when he is around the house. He has not been lightheaded or dizzy. He is compliant with compression socks during the day and an abdominal binder. He is to have his second visit with physical therapy today. He is doing the exercises at home that the physicals therapist wishes him to do.  He has not had chest pain or shortness of breath. Son has records with him  from [place]. Those were photocopied and we will be able to review those. His current blood pressures 108, and earlier reading was on 102. He is asymptomatic with these.  His memory loss is gradually improving. He had one elevated blood pressure of 152/80, others have all been low or normal. However, he has not been tracking them consistently and will be encouraged to do so.  He has had problems with incontinence since coming home from the hospital. This may be getting a little bit better, family is not quite sure. This was not a problem that he had prior to admission.   Past Medical History:  Diagnosis Date  . Coronary artery disease   . MI (myocardial infarction)   . Orthostatic hypotension 08/2016  . Prostate cancer [facility])     Past Surgical History:  Procedure Laterality Date  . cardiac stents    . CARDIAC SURGERY    . CORONARY ARTERY BYPASS GRAFT      Current Outpatient Prescriptions  Medication Sig Dispense Refill  . cholecalciferol (VITAMIN D) 1000 units tablet Take 1,000 Units by mouth daily.    . clopidogrel (PLAVIX) 75 MG tablet Take 75 mg by mouth daily.    . dorzolamide (TRUSOPT) 2 % ophthalmic solution Place 1 drop into both eyes 2 (two) times daily.    Marland Kitchen gabapentin (NEURONTIN) 100 MG capsule Take 600 mg by mouth 3 (three) times daily.     Marland Kitchen latanoprost (XALATAN) 0.005 % ophthalmic solution Place 1 drop into both eyes at  bedtime.     . midodrine (PROAMATINE) 5 MG tablet Take 1 tablet (5 mg total) by mouth 3 (three) times daily with meals. 90 tablet 3  . tamsulosin (FLOMAX) 0.4 MG CAPS capsule Take 0.4 mg by mouth.    . vitamin B-12 (CYANOCOBALAMIN) 100 MCG tablet Take 100 mcg by mouth daily.     No current facility-administered medications for this visit.     Allergies:   Sulfa antibiotics    Social History:  The patient  reports that he quit smoking about 48 years ago. He has never used smokeless tobacco. He reports that he does not drink alcohol.   Family  History:  The patient's family history includes Diabetes in his mother; Uterine cancer in his sister.    ROS:  Please see the history of present illness. All other systems are reviewed and negative.    PHYSICAL EXAM: VS:  BP 108/60   Pulse 80   Ht 5' 11.5" (1.816 m)   Wt 169 lb (76.7 kg)   BMI 23.24 kg/m  , BMI Body mass index is 23.24 kg/m. GEN: Well nourished, well developed, male in no acute distress  HEENT: normal for age  Neck: no JVD, no carotid bruit, no masses Cardiac: RRR; Soft murmur, no rubs, or gallops Respiratory:  clear to auscultation bilaterally, normal work of breathing GI: soft, nontender, nondistended, + BS MS: no deformity or atrophy; no edema; distal pulses are 2+ in all 4 extremities   Skin: warm and dry, no rash Neuro:  Strength and sensation are intact Psych: euthymic mood, full affect   EKG:  EKG is not ordered today.  Recent Labs: 09/03/2016: ALT 16; Hemoglobin 11.1; Platelets 266 09/04/2016: TSH 2.422 09/06/2016: BUN 13; Creatinine, Ser 0.76; Potassium 4.3; Sodium 134    Lipid Panel No results found for: CHOL, TRIG, HDL, CHOLHDL, VLDL, LDLCALC, LDLDIRECT   Wt Readings from Last 3 Encounters:  09/13/16 169 lb (76.7 kg)  09/03/16 165 lb 8 oz (75.1 kg)  09/03/16 169 lb (76.7 kg)     Other studies Reviewed: Additional studies/ records that were reviewed today include: Hospital records, faxed records and testing.  ASSESSMENT AND PLAN:  1.  Orthostatic hypotension: His symptoms have improved on the medical treatment. Continue this.   He had one episode of hypertension, the family will be encouraged to watch for this. They were told by Dr. Johnsie Cancel that at some point he might be able to come off the Midodrine, but advised that his blood pressure is low enough right now that I would not advise starting to taper it. Hopefully, he will improve and we will be able to start tapering this down. If his blood pressure continues to run high in the evenings, we  can cut his midodrine back to twice a day. No other med changes.  2. CAD, history of bypass surgery: He is having no ischemic symptoms. He is on Plavix but not on a beta blocker or ACE inhibitor because of his orthostatic hypotension. He is not currently on a statin, but once his general medical condition is improved, discuss this in follow-up.  3. Abnormal brain MRI: A subacute stroke was seen on the MRI. It was recommended that he follow-up in 6-8 weeks. There were some suggestions from the radiologist regarding the type of MRI procedure needed. This information and Dr. Clydene Fake contact information was given to the patient's son.   Current medicines are reviewed at length with the patient today.  The patient has concerns  regarding medicines. Concerns were addressed  The following changes have been made:  no change  Labs/ tests ordered today include:  No orders of the defined types were placed in this encounter.    Disposition:   FU with Dr. Claiborne Billings in 6 months but will track blood pressures in the meantime and call us.  Augusto Garbe  09/13/2016 10:45 AM    [place] Phone: [phone]; Fax: [phone]  This note was written with the assistance of speech recognition software. Please excuse any transcriptional errors.

## 2016-09-13 NOTE — Telephone Encounter (Signed)
Elaina Hoops called from [facility] requesting approval for Physical Therapy 2 x week for 4 weeks and Home Health Aide 2 x week for 2 weeks. He is also requesting Occupational Therapy for the pt. Please advise?

## 2016-09-14 DIAGNOSIS — I951 Orthostatic hypotension: Secondary | ICD-10-CM | POA: Diagnosis not present

## 2016-09-14 DIAGNOSIS — I2510 Atherosclerotic heart disease of native coronary artery without angina pectoris: Secondary | ICD-10-CM | POA: Diagnosis not present

## 2016-09-16 ENCOUNTER — Telehealth: Payer: Self-pay | Admitting: Neurology

## 2016-09-16 DIAGNOSIS — I951 Orthostatic hypotension: Secondary | ICD-10-CM | POA: Diagnosis not present

## 2016-09-16 DIAGNOSIS — I2510 Atherosclerotic heart disease of native coronary artery without angina pectoris: Secondary | ICD-10-CM | POA: Diagnosis not present

## 2016-09-16 NOTE — Telephone Encounter (Signed)
Yes 6 week routine post stroke f/u appointment with me

## 2016-09-16 NOTE — Telephone Encounter (Signed)
Patients son called to sched apt for f/u frm hosp no referral is in the system and nothing in the notes stating that you wanted pt to f/u. Please advise!

## 2016-09-16 NOTE — Telephone Encounter (Signed)
Message sent to [facility] in referrals for scheduling.

## 2016-09-17 DIAGNOSIS — I2510 Atherosclerotic heart disease of native coronary artery without angina pectoris: Secondary | ICD-10-CM | POA: Diagnosis not present

## 2016-09-17 DIAGNOSIS — I951 Orthostatic hypotension: Secondary | ICD-10-CM | POA: Diagnosis not present

## 2016-09-19 DIAGNOSIS — I951 Orthostatic hypotension: Secondary | ICD-10-CM | POA: Diagnosis not present

## 2016-09-19 DIAGNOSIS — I2510 Atherosclerotic heart disease of native coronary artery without angina pectoris: Secondary | ICD-10-CM | POA: Diagnosis not present

## 2016-09-24 DIAGNOSIS — I951 Orthostatic hypotension: Secondary | ICD-10-CM | POA: Diagnosis not present

## 2016-09-24 DIAGNOSIS — I2510 Atherosclerotic heart disease of native coronary artery without angina pectoris: Secondary | ICD-10-CM | POA: Diagnosis not present

## 2016-09-26 DIAGNOSIS — I2510 Atherosclerotic heart disease of native coronary artery without angina pectoris: Secondary | ICD-10-CM | POA: Diagnosis not present

## 2016-09-26 DIAGNOSIS — I951 Orthostatic hypotension: Secondary | ICD-10-CM | POA: Diagnosis not present

## 2016-09-30 DIAGNOSIS — I2510 Atherosclerotic heart disease of native coronary artery without angina pectoris: Secondary | ICD-10-CM | POA: Diagnosis not present

## 2016-09-30 DIAGNOSIS — I951 Orthostatic hypotension: Secondary | ICD-10-CM | POA: Diagnosis not present

## 2016-10-10 DIAGNOSIS — I2510 Atherosclerotic heart disease of native coronary artery without angina pectoris: Secondary | ICD-10-CM | POA: Diagnosis not present

## 2016-10-10 DIAGNOSIS — I951 Orthostatic hypotension: Secondary | ICD-10-CM | POA: Diagnosis not present

## 2016-10-22 ENCOUNTER — Ambulatory Visit (INDEPENDENT_AMBULATORY_CARE_PROVIDER_SITE_OTHER): Payer: Medicare Other | Admitting: Neurology

## 2016-10-22 ENCOUNTER — Encounter: Payer: Self-pay | Admitting: Neurology

## 2016-10-22 DIAGNOSIS — G3184 Mild cognitive impairment, so stated: Secondary | ICD-10-CM | POA: Insufficient documentation

## 2016-10-22 DIAGNOSIS — I639 Cerebral infarction, unspecified: Secondary | ICD-10-CM

## 2016-10-22 MED ORDER — MIDODRINE HCL 5 MG PO TABS: 2.5000 mg | ORAL_TABLET | Freq: Three times a day (TID) | ORAL | 3 refills | Status: DC

## 2016-10-22 NOTE — Patient Instructions (Addendum)
I had a long d/w patient , son and daughter in law about his recent silent thalamic stroke, orthostatic hypotention and near syncope, new memory loss from mild cognitive impairment, risk for recurrent stroke/TIAs, personally independently reviewed imaging studies and stroke evaluation results and answered questions.Continue Plavix  for secondary stroke prevention and maintain strict control of hypertension with blood pressure goal below 130/90, diabetes with hemoglobin A1c goal below 6.5% and lipids with LDL cholesterol goal below 70 mg/dL. I also advised the patient to start taking nutritional supplement like Prevagen or resevetrol daily for his mild cognitive impairment as well as increased participation in mentally challenging activities like solving crossword puzzles, playing bridge and sudoku. We also discussed memory compensation strategies. Repeat MRI scan of the brain with and without contrast to evaluate memory loss as well as follow-up is thalamic infarct. Reduced the dose of midodrine to 2.5 mg 3 times daily as he is not having orthostatic symptoms anymore. Also reduce the gabapentin dose to 600 mg in the morning and  afternoon and  300 mg at night. Followup in the future with my nurse practitioner in 6 months or call earlier if necessary  Memory Compensation Strategies  1. Use "WARM" strategy.  W= write it down  A= associate it  R= repeat it  M= make a mental note  2.   You can keep a Social worker.  Use a 3-ring notebook with sections for the following: calendar, important names and phone numbers,  medications, doctors' names/phone numbers, lists/reminders, and a section to journal what you did  each day.   3.    Use a calendar to write appointments down.  4.    Write yourself a schedule for the day.  This can be placed on the calendar or in a separate section of the Memory Notebook.  Keeping a  regular schedule can help memory.  5.    Use medication organizer with sections for each  day or morning/evening pills.  You may need help loading it  6.    Keep a basket, or pegboard by the door.  Place items that you need to take out with you in the basket or on the pegboard.  You may also want to  include a message board for reminders.  7.    Use sticky notes.  Place sticky notes with reminders in a place where the task is performed.  For example: " turn off the  stove" placed by the stove, "lock the door" placed on the door at eye level, " take your medications" on  the bathroom mirror or by the place where you normally take your medications.  8.    Use alarms/timers.  Use while cooking to remind yourself to check on food or as a reminder to take your medicine, or as a  reminder to make a call, or as a reminder to perform another task, etc.

## 2016-10-22 NOTE — Progress Notes (Signed)
[facility] [address] [place]. [place] [address] [phone]       OFFICE FOLLOW-UP NOTE  Thomas. Thomas Hanna Date of Birth:  [birth year] Medical Record Number:  [id number]   HPI: Thomas Hanna is a 83 year caucasian male seen today for first office follow-up visit for in-hospital consultation for stroke in January 2018. He is accompanied by his son and daughter-in-law. History is obtained from them as well as review of Hospital medical records and have personally reviewed imaging studies.Thomas Hanna an 83 y.o.malewith a history of myocardial infarction, coronary artery disease and prostate cancer, admitted for evaluation and management of recurrent episodes of hypotension and loss of consciousness or near loss of consciousness. Symptoms are precipitated by sitting or standing, and relieved by lying supine. Neurology consultation was obtained because of abnormality on MRI indicating probable subacute left thalamic infarction. Exact onset is unclear. He has had episodes of garbled speech and confusion associated with hypotension and orthostasis, which have essentially cleared after lying down. No facial droop is been noted. He said no weakness nor numbness involving right extremities. He takes Plavix 75 mg per day.LSN:08/28/2016 tPA Given:No: Unclear onset of thalamic stroke; no focal deficits. MRI scan of the brain showed a 14 mm left thalamic subacute infarct but follow-up MRI scan is recommended to rule out a mass. CT angiogram showed 50% origin stenosis of the left posterior cerebral artery and bilateral 65-70% internal carotid artery stenosis with calcification. Transthoracic echo showed normal ejection fraction. TSH and cortisol levels were normal. Hemoglobin A1c was 5.7. Patient had history of statin intolerance and LDL cholesterol was not repeated as he hadn't done in office visit on 09/02/16 with Dr. Forde Dandy. Patient was started on metoprolol and family went 3 times  daily for orthostatic hypertension. He states his done well since discharge his blood pressure has been actually quite high today it is elevated in office at 147/57. He is also taking gabapentin 603 times daily neuropathic pain. Patient and family feel that he has had some memory decline and if thinking this may be related to his new medications. He has trouble remembering recent information and short-term memory but can remember things from the past. His walking with a rollator walker and has had no falls or injuries. He has had no new stroke or TIA symptoms. He is tolerating Plavix well with minor bruising but no bleeding. He has finished home physical and occupational therapy and is planning to do outpatient therapy.  ROS:   14 system review of systems is positive for easy bruising, bleeding, memory loss, confusion, walking difficulty, snoring and all other systems negative  PMH:  Past Medical History:  Diagnosis Date  . Coronary artery disease   . MI (myocardial infarction)   . Orthostatic hypotension 08/2016  . Prostate cancer ([place])   . Stroke [facility])     Social History:  Social History   Social History  . Marital status: Widowed    Spouse name: N/A  . Number of children: N/A  . Years of education: N/A   Occupational History  . Not on file.   Social History Main Topics  . Smoking status: Former Smoker    Quit date: 1970  . Smokeless tobacco: Never Used  . Alcohol use No  . Drug use: No  . Sexual activity: Not on file   Other Topics Concern  . Not on file   Social History Narrative  . No narrative on file    Medications:  Current Outpatient Prescriptions on File Prior to Visit  Medication Sig Dispense Refill  . cholecalciferol (VITAMIN D) 1000 units tablet Take 1,000 Units by mouth daily.    . clopidogrel (PLAVIX) 75 MG tablet Take 75 mg by mouth daily.    . dorzolamide (TRUSOPT) 2 % ophthalmic solution Place 1 drop into both eyes 2 (two) times daily.    Marland Kitchen  gabapentin (NEURONTIN) 100 MG capsule Take 600 mg by mouth 3 (three) times daily.     Marland Kitchen latanoprost (XALATAN) 0.005 % ophthalmic solution Place 1 drop into both eyes at bedtime.     . tamsulosin (FLOMAX) 0.4 MG CAPS capsule Take 0.4 mg by mouth.    . vitamin B-12 (CYANOCOBALAMIN) 100 MCG tablet Take 100 mcg by mouth daily.     No current facility-administered medications on file prior to visit.     Allergies:  No Active Allergies  Physical Exam General: Frail elderly Caucasian male, seated, in no evident distress Head: head normocephalic and atraumatic.  Neck: supple with no carotid or supraclavicular bruits Cardiovascular: regular rate and rhythm, no murmurs Musculoskeletal: no deformity Skin:  no rash/petichiae Vascular:  Normal pulses all extremities Vitals:   10/22/16 1446  BP: (!) 147/57  Pulse: 61   Neurologic Exam Mental Status: Awake and fully alert. Oriented to place and time. Recent and remote memory intact. Attention span, concentration and fund of knowledge appropriate. Mood and affect appropriate. Diminished attention, registration. Recall 2/3. Animal naming 9 only. Clock drawing 4/4. Cranial Nerves: Fundoscopic exam reveals sharp disc margins. Pupils equal, briskly reactive to light. Extraocular movements full without nystagmus. Visual fields full to confrontation. Hearing diminished slightly bilaterally.. Facial sensation intact. Face, tongue, palate moves normally and symmetrically.  Motor: Normal bulk and tone. Normal strength in all tested extremity muscles. Sensory.: intact to touch ,pinprick .position and vibratory sensation.  Coordination: Rapid alternating movements normal in all extremities. Finger-to-nose and heel-to-shin performed accurately bilaterally. Gait and Station: Arises from chair without difficulty. Stance is slightly stooped. Uses Rollator walker. Gait demonstrates normal stride length and balance . Not able to heel, toe and tandem walk .Reflexes: 1+  and symmetric. Toes downgoing.   NIHSS  0 Modified Rankin  2   ASSESSMENT: 83 year Caucasian male with subacute left thalamic infarct secondary to small vessel disease in January 2018 which was probably clinically silent and occurred in the setting of presyncopal episodes with hypotension. He is doing well with improvement in blood pressure but has new complaints of memory loss likely due to age-related mild cognitive impairment.    PLAN: I had a long d/w patient , son and daughter in law about his recent silent thalamic stroke, orthostatic hypotention and near syncope, new memory loss from mild cognitive impairment, risk for recurrent stroke/TIAs, personally independently reviewed imaging studies and stroke evaluation results and answered questions.Continue Plavix  for secondary stroke prevention and maintain strict control of hypertension with blood pressure goal below 130/90, diabetes with hemoglobin A1c goal below 6.5% and lipids with LDL cholesterol goal below 70 mg/dL. I also advised the patient to start taking nutritional supplement like Prevagen or resevetrol daily for his mild cognitive impairment as well as increased participation in mentally challenging activities like solving crossword puzzles, playing bridge and sudoku. We also discussed memory compensation strategies. Repeat MRI scan of the brain with and without contrast to evaluate memory loss as well as follow-up is thalamic infarct. Reduce  the dose of midodrine to 2.5 mg 3 times daily as he is  not having orthostatic symptoms anymore. Also reduce the gabapentin dose to 600 mg in the morning and  afternoon and  300 mg at night. Followup in the future with my nurse practitioner in 6 months or call earlier if necessary Greater than 50% of time during this 35 minute visit was spent on counseling,explanation of diagnosis of lacunar stroke, mild cognitive impairment, planning of further management, discussion with patient and family and  coordination of care Antony Contras, MD  [facility] [address] [place], [place] [address]  Phone [phone] Fax [phone] Note: This document was prepared with digital dictation and possible smart phrase technology. Any transcriptional errors that result from this process are unintentional

## 2016-11-11 ENCOUNTER — Ambulatory Visit
Admission: RE | Admit: 2016-11-11 | Discharge: 2016-11-11 | Disposition: A | Discharge status: Home / Self Care | Payer: Medicare Other | Source: Ambulatory Visit | Attending: Neurology | Admitting: Neurology

## 2016-11-11 DIAGNOSIS — G3184 Mild cognitive impairment, so stated: Secondary | ICD-10-CM | POA: Diagnosis not present

## 2016-11-11 DIAGNOSIS — R413 Other amnesia: Secondary | ICD-10-CM | POA: Diagnosis not present

## 2016-11-11 MED ORDER — GADOBENATE DIMEGLUMINE 529 MG/ML IV SOLN
15.0000 mL | Freq: Once | INTRAVENOUS | Status: AC | PRN
  Administered 2016-11-11: 15 mL via INTRAVENOUS

## 2016-11-14 ENCOUNTER — Telehealth: Payer: Self-pay

## 2016-11-14 NOTE — Telephone Encounter (Signed)
RN call patients son on his dpr [name]. Rn stated the MRI of scan for his father showed age expected age related shrinkage of the brain and the left brain thalamic infarct, no new worrisome findings. Pts son verbalized understanding.

## 2016-11-14 NOTE — Telephone Encounter (Signed)
-----   Message from Garvin Fila, MD sent at 11/13/2016  5:08 PM EDT ----- Thomas Hanna inform the patient had MRI scan of the brain shows expected age-related shrinkage of the brain and  left brain thalamic infarct. No new or worrisome findings

## 2017-01-02 ENCOUNTER — Telehealth: Payer: Self-pay | Admitting: Osteopathic Medicine

## 2017-01-02 NOTE — Telephone Encounter (Signed)
I am continuing to get records for this patient, I have only seen him once and since then he has been in the hospital, seen by neurology, seems like a complicated medical situation. I cannot safely manage his primary care without seeing him back in the office at some point for follow-up.   Please call son: Constantinos Krempasky Phone# [phone] (on DPR) and see if they are able to bring this patient in for follow-up, or if he is in a nursing home or assisted living facility under the care of another physician, they are welcome to send me records if the patient plans on coming to see me again.

## 2017-01-02 NOTE — Telephone Encounter (Signed)
Called patient son and he stated that his father has a primary care doctor at the [place] so that is where his father will be going. He stated that he doubts he will return back to this office. Rube Sanchez,CMA

## 2017-01-02 NOTE — Telephone Encounter (Signed)
Okay, let's remove me as his PCP, thanks for looking into this

## 2017-01-03 NOTE — Telephone Encounter (Signed)
You are welcome and PCP has been removed. Thomas Hanna,CMA

## 2017-02-14 ENCOUNTER — Emergency Department (INDEPENDENT_AMBULATORY_CARE_PROVIDER_SITE_OTHER): Payer: Medicare Other

## 2017-02-14 ENCOUNTER — Emergency Department (INDEPENDENT_AMBULATORY_CARE_PROVIDER_SITE_OTHER)
Admission: EM | Admit: 2017-02-14 | Discharge: 2017-02-14 | Disposition: A | Discharge status: Home / Self Care | Payer: Medicare Other | Source: Home / Self Care | Attending: Family Medicine | Admitting: Family Medicine

## 2017-02-14 DIAGNOSIS — R079 Chest pain, unspecified: Secondary | ICD-10-CM | POA: Diagnosis not present

## 2017-02-14 DIAGNOSIS — M5010 Cervical disc disorder with radiculopathy, unspecified cervical region: Secondary | ICD-10-CM

## 2017-02-14 DIAGNOSIS — M542 Cervicalgia: Secondary | ICD-10-CM | POA: Diagnosis not present

## 2017-02-14 DIAGNOSIS — R0789 Other chest pain: Secondary | ICD-10-CM

## 2017-02-14 DIAGNOSIS — M5030 Other cervical disc degeneration, unspecified cervical region: Secondary | ICD-10-CM | POA: Diagnosis not present

## 2017-02-14 LAB — COMPLETE METABOLIC PANEL WITH GFR
ALT: 13 U/L (ref 9–46)
AST: 22 U/L (ref 10–35)
Albumin: 4.6 g/dL (ref 3.6–5.1)
Alkaline Phosphatase: 73 U/L (ref 40–115)
BUN: 22 mg/dL (ref 7–25)
CALCIUM: 9.3 mg/dL (ref 8.6–10.3)
CHLORIDE: 99 mmol/L (ref 98–110)
CO2: 29 mmol/L (ref 20–31)
Creat: 0.78 mg/dL (ref 0.70–1.11)
GFR, EST NON AFRICAN AMERICAN: 79 mL/min (ref >=60)
Glucose, Bld: 94 mg/dL (ref 65–99)
POTASSIUM: 4.5 mmol/L (ref 3.5–5.3)
Sodium: 136 mmol/L (ref 135–146)
Total Bilirubin: 0.4 mg/dL (ref 0.2–1.2)
Total Protein: 7.1 g/dL (ref 6.1–8.1)

## 2017-02-14 LAB — CBC WITH DIFFERENTIAL/PLATELET
BASOS PCT: 0 %
Basophils Absolute: 0 cells/uL (ref 0–200)
EOS PCT: 3 %
Eosinophils Absolute: 255 cells/uL (ref 15–500)
HEMATOCRIT: 38.9 % (ref 38.5–50.0)
HEMOGLOBIN: 12.7 g/dL — AB (ref 13.2–17.1)
LYMPHS ABS: 1785 {cells}/uL (ref 850–3900)
Lymphocytes Relative: 21 %
MCH: 30.4 pg (ref 27.0–33.0)
MCHC: 32.6 g/dL (ref 32.0–36.0)
MCV: 93.1 fL (ref 80.0–100.0)
MONO ABS: 595 {cells}/uL (ref 200–950)
MPV: 11.1 fL (ref 7.5–12.5)
Monocytes Relative: 7 %
Neutro Abs: 5865 cells/uL (ref 1500–7800)
Neutrophils Relative %: 69 %
Platelets: 439 10*3/uL — ABNORMAL HIGH (ref 140–400)
RBC: 4.18 MIL/uL — AB (ref 4.20–5.80)
RDW: 13.7 % (ref 11.0–15.0)
WBC: 8.5 10*3/uL (ref 3.8–10.8)

## 2017-02-14 MED ORDER — PREDNISONE 20 MG PO TABS: ORAL_TABLET | ORAL | 0 refills | Status: DC

## 2017-02-14 NOTE — Discharge Instructions (Signed)
Apply ice pack to back of neck for 20 minutes, 3 to 4 times daily  Continue until pain decreases.  Wear soft cervical collar at bedtime.

## 2017-02-14 NOTE — ED Triage Notes (Signed)
Pt states he's having pains across the top of chest over his clavicle bones, neck, into both ears and the base of his neck for 1 month.

## 2017-02-14 NOTE — ED Provider Notes (Signed)
[name]    CSN: [id number] Arrival date & time: 02/14/17  0858     History   Chief Complaint Chief Complaint  Patient presents with  . Chest Pain  . Neck Pain  . Otalgia    HPI Thomas Hanna is a 83 y.o. male.   Patient complains of one month history of generally constant pain in his bilateral upper chest radiating to his neck bilaterally.  The pain is worse when he turns his head to the right and extends his neck.  No distal paresthesias.  He recalls no injury or change in activities.   The history is provided by the patient and a relative.  Chest Pain  Chest pain location: upper bilateral chest. Pain quality: aching   Radiates to: bilateral neck. Pain severity:  Mild Onset quality:  Gradual Duration:  1 month Timing:  Constant Progression:  Unchanged Chronicity:  Chronic Context: movement   Relieved by:  Nothing Worsened by:  Certain positions Ineffective treatments:  None tried Associated symptoms: no abdominal pain, no anorexia, no back pain, no cough, no diaphoresis, no dysphagia, no fatigue, no fever, no headache, no heartburn, no lower extremity edema, no nausea, no numbness, no palpitations, no shortness of breath, no syncope and no weakness   Neck Pain  Associated symptoms: chest pain   Associated symptoms: no fever, no headaches, no numbness, no syncope and no weakness   Otalgia  Associated symptoms: neck pain   Associated symptoms: no abdominal pain, no cough, no fever and no headaches     Past Medical History:  Diagnosis Date  . Coronary artery disease   . MI (myocardial infarction)   . Orthostatic hypotension 08/2016  . Prostate cancer ([place])   . Stroke [facility])     Patient Active Problem List   Diagnosis Date Noted  . MCI (mild cognitive impairment) 10/22/2016  . Orthostatic hypertension 09/13/2016  . Coronary artery disease involving coronary bypass graft of native heart without angina pectoris 09/13/2016  . Abnormal MRI of  head 09/13/2016  . Orthostatic hypotension 09/03/2016  . CAD S/P percutaneous coronary angioplasty 09/03/2016  . Hx of CABG 09/03/2016  . Dyslipidemia 09/03/2016  . Coronary artery disease due to lipid rich plaque 08/29/2016  . Hyponatremia 08/29/2016  . Elevated LFTs 08/29/2016  . Normocytic anemia 08/29/2016  . Syncope 08/28/2016    Past Surgical History:  Procedure Laterality Date  . cardiac stents    . CARDIAC SURGERY    . CORONARY ARTERY BYPASS GRAFT         Home Medications    Prior to Admission medications   Medication Sig Start Date End Date Taking? Authorizing Provider  FERROUS SULFATE PO Take by mouth.   Yes [provider]  Ginkgo Biloba 40 MG TABS Take by mouth.   Yes [provider]  cholecalciferol (VITAMIN D) 1000 units tablet Take 1,000 Units by mouth daily.    [provider]  clopidogrel (PLAVIX) 75 MG tablet Take 75 mg by mouth daily.    [provider]  dorzolamide (TRUSOPT) 2 % ophthalmic solution Place 1 drop into both eyes 2 (two) times daily.    [provider]  gabapentin (NEURONTIN) 100 MG capsule Take 600 mg by mouth 3 (three) times daily.     [provider]  latanoprost (XALATAN) 0.005 % ophthalmic solution Place 1 drop into both eyes at bedtime.     [provider]  midodrine (PROAMATINE) 5 MG tablet Take 0.5 tablets (2.5 mg  total) by mouth 3 (three) times daily with meals. 10/22/16   Garvin Fila, MD  predniSONE (DELTASONE) 20 MG tablet Take one tab by mouth twice daily for 4 days, then one daily for 3 days. Take with food. 02/14/17   Kandra Nicolas, MD  tamsulosin (FLOMAX) 0.4 MG CAPS capsule Take 0.4 mg by mouth.    [provider]  vitamin B-12 (CYANOCOBALAMIN) 100 MCG tablet Take 100 mcg by mouth daily.    [provider]    Family History Family History  Problem Relation Age of Onset  . Diabetes Mother   . Uterine cancer Sister     Social History Social  History  Substance Use Topics  . Smoking status: Former Smoker    Quit date: 1970  . Smokeless tobacco: Never Used  . Alcohol use No     Allergies   Patient has no known allergies.   Review of Systems Review of Systems  Constitutional: Negative for diaphoresis, fatigue and fever.  HENT: Positive for ear pain. Negative for trouble swallowing.   Respiratory: Negative for cough and shortness of breath.   Cardiovascular: Positive for chest pain. Negative for palpitations and syncope.  Gastrointestinal: Negative for abdominal pain, anorexia, heartburn and nausea.  Musculoskeletal: Positive for neck pain. Negative for back pain.  Neurological: Negative for weakness, numbness and headaches.  All other systems reviewed and are negative.    Physical Exam Triage Vital Signs ED Triage Vitals [02/14/17 0946]  Enc Vitals Group     BP 135/69     Pulse Rate (!) 58     Resp 18     Temp 98.5 F (36.9 C)     Temp Source Oral     SpO2 98 %     Weight 170 lb 4 oz (77.2 kg)     Height 5' 11.5" (1.816 m)     Head Circumference      Peak Flow      Pain Score      Pain Loc      Pain Edu?      Excl. in [place]?    No data found.   Updated Vital Signs BP 135/69 (BP Location: Left Arm)   Pulse (!) 58   Temp 98.5 F (36.9 C) (Oral)   Resp 18   Ht 5' 11.5" (1.816 m)   Wt 170 lb 4 oz (77.2 kg)   SpO2 98%   BMI 23.41 kg/m   Visual Acuity Right Eye Distance:   Left Eye Distance:   Bilateral Distance:    Right Eye Near:   Left Eye Near:    Bilateral Near:     Physical Exam  Constitutional: He appears well-developed and well-nourished. No distress.  HENT:  Head: Atraumatic.  Mouth/Throat: Oropharynx is clear and moist.  Eyes: Pupils are equal, round, and reactive to light.  Neck: Muscular tenderness present. Decreased range of motion present.    Neck has decreased range of motion with tenderness bilateral sternocleidomastoid muscles and trapezius muscles, extending to  occipital area.  Cardiovascular: Normal heart sounds.   Pulmonary/Chest: Breath sounds normal.  Abdominal: There is no tenderness.  Musculoskeletal: He exhibits no edema.  Lymphadenopathy:    He has no cervical adenopathy.  Neurological: He is alert.  Skin: Skin is warm and dry. No rash noted.  Vitals reviewed.    UC Treatments / Results  Labs (all labs ordered are listed, but only abnormal results are displayed) Labs Reviewed  COMPLETE METABOLIC PANEL WITH  GFR  CBC WITH DIFFERENTIAL/PLATELET    EKG  EKG Interpretation None       Radiology Dg Chest 2 View  Result Date: 02/14/2017 CLINICAL DATA:  Bilateral neck and upper anterior chest pain x 1 month. Not injury. EXAM: CHEST  2 VIEW COMPARISON:  None. FINDINGS: There changes from previous CABG surgery. The cardiac silhouette is normal in size and configuration. No mediastinal or hilar masses. There is no evidence of adenopathy. Lungs are clear.  No pleural effusion or pneumothorax. Skeletal structures are demineralized but intact. IMPRESSION: No acute cardiopulmonary disease. Electronically Signed   By: Lajean Manes M.D.   On: 02/14/2017 11:03   Dg Cervical Spine Complete  Result Date: 02/14/2017 CLINICAL DATA:  Bilateral neck and upper anterior chest pain x 1 month. Not injury. EXAM: CERVICAL SPINE - COMPLETE 4+ VIEW COMPARISON:  None. FINDINGS: No fracture, spondylolisthesis or bone lesion. Mild loss of disc height at C3-C4 and C4-C5. There are small endplate osteophytes at C3, C4 and C5. Facet degenerative changes noted bilaterally, most prominently on the right at C4-C5. Facet spurring leads to mild neural foraminal narrowing on the right at C4-C5. There is mild neural foraminal narrowing on the left at C3-C4 and C4-C5 from uncovertebral spurring. Bones are demineralized. Soft tissues are unremarkable other than left sided carotid vascular calcifications. IMPRESSION: 1. No fracture, spondylolisthesis, bone lesion or acute  finding. 2. Degenerative changes as described. Electronically Signed   By: Lajean Manes M.D.   On: 02/14/2017 11:06    Procedures Procedures (including critical care time)  Medications Ordered in UC Medications - No data to display   Initial Impression / Assessment and Plan / UC Course  I have reviewed the triage vital signs and the nursing notes.  Pertinent labs & imaging results that were available during my care of the patient were reviewed by me and considered in my medical decision making (see chart for details).    Begin prednisone burst/taper.  Dispensed soft cervical collar. Apply ice pack to back of neck for 20 minutes, 3 to 4 times daily  Continue until pain decreases.  Wear soft cervical collar at bedtime. Followup with Dr. Georgina Snell for further evaluation. Patient requests follow-up lab testing for chronic problems (note last hgb 11.1 on 09/03/16 and CMP showed sodium 133):  Check CBC and CMP    Final Clinical Impressions(s) / UC Diagnoses   Final diagnoses:  Cervical disc disorder with radiculopathy of cervical region    New Prescriptions New Prescriptions   PREDNISONE (DELTASONE) 20 MG TABLET    Take one tab by mouth twice daily for 4 days, then one daily for 3 days. Take with food.     Kandra Nicolas, MD 02/20/17 [phone]

## 2017-02-16 ENCOUNTER — Telehealth: Payer: Self-pay | Admitting: Emergency Medicine

## 2017-02-16 NOTE — Telephone Encounter (Signed)
Patient informed of Negative Results. Advised to call back with questions or concerns.  

## 2017-02-18 ENCOUNTER — Ambulatory Visit (INDEPENDENT_AMBULATORY_CARE_PROVIDER_SITE_OTHER): Payer: Medicare Other | Admitting: Family Medicine

## 2017-02-18 ENCOUNTER — Encounter: Payer: Self-pay | Admitting: Family Medicine

## 2017-02-18 DIAGNOSIS — I639 Cerebral infarction, unspecified: Secondary | ICD-10-CM

## 2017-02-18 DIAGNOSIS — M62838 Other muscle spasm: Secondary | ICD-10-CM

## 2017-02-18 NOTE — Progress Notes (Signed)
Thomas Hanna is a 83 y.o. male who presents to [facility] today for neck pain. Patient notes a one-month history of pain in the neck. He notes pain in the anterior bilateral neck and posterior bilateral neck extending to the base of the skull. He denies any pain radiating down his arms weakness or numbness. He was seen in urgent care on July 6 where an x-ray of his cervical spine showed DDD. He was treated with oral prednisone which has helped his symptoms. He does his symptoms are quite bothersome and are worse with activity and better with rest.   Past Medical History:  Diagnosis Date  . Coronary artery disease   . MI (myocardial infarction) ([place])   . Orthostatic hypotension 08/2016  . Prostate cancer ([place])   . Stroke [facility])    Past Surgical History:  Procedure Laterality Date  . cardiac stents    . CARDIAC SURGERY    . CORONARY ARTERY BYPASS GRAFT     Social History  Substance Use Topics  . Smoking status: Former Smoker    Quit date: 1970  . Smokeless tobacco: Never Used  . Alcohol use No     ROS:  As above   Medications: Current Outpatient Prescriptions  Medication Sig Dispense Refill  . cholecalciferol (VITAMIN D) 1000 units tablet Take 1,000 Units by mouth daily.    . clopidogrel (PLAVIX) 75 MG tablet Take 75 mg by mouth daily.    . dorzolamide (TRUSOPT) 2 % ophthalmic solution Place 1 drop into both eyes 2 (two) times daily.    Marland Kitchen FERROUS SULFATE PO Take by mouth.    . gabapentin (NEURONTIN) 100 MG capsule Take 600 mg by mouth 3 (three) times daily.     . Ginkgo Biloba 40 MG TABS Take by mouth.    . latanoprost (XALATAN) 0.005 % ophthalmic solution Place 1 drop into both eyes at bedtime.     . midodrine (PROAMATINE) 5 MG tablet Take 0.5 tablets (2.5 mg total) by mouth 3 (three) times daily with meals. 90 tablet 3  . tamsulosin (FLOMAX) 0.4 MG CAPS capsule Take 0.4 mg by mouth.    . vitamin B-12 (CYANOCOBALAMIN) 100  MCG tablet Take 100 mcg by mouth daily.     No current facility-administered medications for this visit.    No Known Allergies   Exam:  BP (!) 158/52   Pulse 67   Wt 171 lb (77.6 kg)   SpO2 99%   BMI 23.52 kg/m  General: Well Developed, well nourished, and in no acute distress.  Neuro/Psych: Alert and oriented x3, extra-ocular muscles intact, able to move all 4 extremities, sensation grossly intact. Skin: Warm and dry, no rashes noted.  Respiratory: Not using accessory muscles, speaking in full sentences, trachea midline.  Cardiovascular: Pulses palpable, no extremity edema. Abdomen: Does not appear distended. MSK:  Cervical spine is nontender to spinal midline. Cervical spine range of motion is reduced. Negative Spurling's test bilaterally. Upper extremity strength reflexes and sensation are intact and equal throughout. Patient is tender to palpation along the trapezius muscle groups cervical paraspinal muscle groups and sternocleidomastoids.  X-rays cervical spine dated 02/14/2017 showing DDD reviewed.  No results found for this or any previous visit (from the past 48 hour(s)). No results found.    Assessment and Plan: 83 y.o. male with cervical spine pain. The location of the pain is consistent with the major muscle groups the control neck motion. I think this is myofascial dysfunction  strain and spasm. He is doing a bit better today than he has been previously. I think it's reasonable to proceed with physical therapy and recheck in about a month. Return sooner if needed.    Orders Placed This Encounter  Procedures  . Ambulatory referral to Physical Therapy    Referral Priority:   Routine    Referral Type:   Physical Medicine    Referral Reason:   Specialty Services Required    Requested Specialty:   Physical Therapy   No orders of the defined types were placed in this encounter.   Discussed warning signs or symptoms. Please see discharge instructions. Patient  expresses understanding.

## 2017-02-18 NOTE — Patient Instructions (Signed)
Thank you for coming in today. Attend PT.  Recheck in 4-6 weeks.  Use a heating pad.  Use a TENS unit.   TENS UNIT: This is helpful for muscle pain and spasm.   Search and Purchase a TENS 7000 2nd edition at  www.tenspros.com or www.[employer]ish.com It should be less than $30.     TENS unit instructions: Do not shower or bathe with the unit on Turn the unit off before removing electrodes or batteries If the electrodes lose stickiness add a drop of water to the electrodes after they are disconnected from the unit and place on plastic sheet. If you continued to have difficulty, call the TENS unit company to purchase more electrodes. Do not apply lotion on the skin area prior to use. Make sure the skin is clean and dry as this will help prolong the life of the electrodes. After use, always check skin for unusual red areas, rash or other skin difficulties. If there are any skin problems, does not apply electrodes to the same area. Never remove the electrodes from the unit by pulling the wires. Do not use the TENS unit or electrodes other than as directed. Do not change electrode placement without consultating your therapist or physician. Keep 2 fingers with between each electrode. Wear time ratio is 2:1, on to off times.    For example on for 30 minutes off for 15 minutes and then on for 30 minutes off for 15 minutes     Cervical Strain and Sprain Rehab Ask your health care provider which exercises are safe for you. Do exercises exactly as told by your health care provider and adjust them as directed. It is normal to feel mild stretching, pulling, tightness, or discomfort as you do these exercises, but you should stop right away if you feel sudden pain or your pain gets worse.Do not begin these exercises until told by your health care provider. Stretching and range of motion exercises These exercises warm up your muscles and joints and improve the movement and flexibility of your neck. These  exercises also help to relieve pain, numbness, and tingling. Exercise A: Cervical side bend  1. Using good posture, sit on a stable chair or stand up. 2. Without moving your shoulders, slowly tilt your left / right ear to your shoulder until you feel a stretch in your neck muscles. You should be looking straight ahead. 3. Hold for __________ seconds. 4. Repeat with the other side of your neck. Repeat __________ times. Complete this exercise __________ times a day. Exercise B: Cervical rotation  1. Using good posture, sit on a stable chair or stand up. 2. Slowly turn your head to the side as if you are looking over your left / right shoulder. ? Keep your eyes level with the ground. ? Stop when you feel a stretch along the side and the back of your neck. 3. Hold for __________ seconds. 4. Repeat this by turning to your other side. Repeat __________ times. Complete this exercise __________ times a day. Exercise C: Thoracic extension and pectoral stretch 1. Roll a towel or a small blanket so it is about 4 inches (10 cm) in diameter. 2. Lie down on your back on a firm surface. 3. Put the towel lengthwise, under your spine in the middle of your back. It should not be not under your shoulder blades. The towel should line up with your spine from your middle back to your lower back. 4. Put your hands behind your head  and let your elbows fall out to your sides. 5. Hold for __________ seconds. Repeat __________ times. Complete this exercise __________ times a day. Strengthening exercises These exercises build strength and endurance in your neck. Endurance is the ability to use your muscles for a long time, even after your muscles get tired. Exercise D: Upper cervical flexion, isometric 1. Lie on your back with a thin pillow behind your head and a small rolled-up towel under your neck. 2. Gently tuck your chin toward your chest and nod your head down to look toward your feet. Do not lift your head  off the pillow. 3. Hold for __________ seconds. 4. Release the tension slowly. Relax your neck muscles completely before you repeat this exercise. Repeat __________ times. Complete this exercise __________ times a day. Exercise E: Cervical extension, isometric  1. Stand about 6 inches (15 cm) away from a wall, with your back facing the wall. 2. Place a soft object, about 6-8 inches (15-20 cm) in diameter, between the back of your head and the wall. A soft object could be a small pillow, a ball, or a folded towel. 3. Gently tilt your head back and press into the soft object. Keep your jaw and forehead relaxed. 4. Hold for __________ seconds. 5. Release the tension slowly. Relax your neck muscles completely before you repeat this exercise. Repeat __________ times. Complete this exercise __________ times a day. Posture and body mechanics  Body mechanics refers to the movements and positions of your body while you do your daily activities. Posture is part of body mechanics. Good posture and healthy body mechanics can help to relieve stress in your body's tissues and joints. Good posture means that your spine is in its natural S-curve position (your spine is neutral), your shoulders are pulled back slightly, and your head is not tipped forward. The following are general guidelines for applying improved posture and body mechanics to your everyday activities. Standing  When standing, keep your spine neutral and keep your feet about hip-width apart. Keep a slight bend in your knees. Your ears, shoulders, and hips should line up.  When you do a task in which you stand in one place for a long time, place one foot up on a stable object that is 2-4 inches (5-10 cm) high, such as a footstool. This helps keep your spine neutral. Sitting   When sitting, keep your spine neutral and your keep feet flat on the floor. Use a footrest, if necessary, and keep your thighs parallel to the floor. Avoid rounding your  shoulders, and avoid tilting your head forward.  When working at a desk or a computer, keep your desk at a height where your hands are slightly lower than your elbows. Slide your chair under your desk so you are close enough to maintain good posture.  When working at a computer, place your monitor at a height where you are looking straight ahead and you do not have to tilt your head forward or downward to look at the screen. Resting When lying down and resting, avoid positions that are most painful for you. Try to support your neck in a neutral position. You can use a contour pillow or a small rolled-up towel. Your pillow should support your neck but not push on it. This information is not intended to replace advice given to you by your health care provider. Make sure you discuss any questions you have with your health care provider. Document Released: 07/29/2005 Document Revised: 04/04/2016 Document  Reviewed: 07/05/2015 Elsevier Interactive Patient Education  [employer].

## 2017-03-06 ENCOUNTER — Encounter: Payer: Self-pay | Admitting: Rehabilitative and Restorative Service Providers"

## 2017-03-06 ENCOUNTER — Ambulatory Visit (INDEPENDENT_AMBULATORY_CARE_PROVIDER_SITE_OTHER): Payer: Medicare Other | Admitting: Rehabilitative and Restorative Service Providers"

## 2017-03-06 DIAGNOSIS — M542 Cervicalgia: Secondary | ICD-10-CM | POA: Diagnosis present

## 2017-03-06 DIAGNOSIS — R293 Abnormal posture: Secondary | ICD-10-CM

## 2017-03-06 NOTE — Therapy (Addendum)
[place], [place], [address] Phone: [phone]   Fax:  [phone]  Physical Therapy Evaluation  Patient Details  Name: Thomas Hanna MRN: [id number] Date of Birth: 1923-12-31 Referring Provider: Dr Lynne Leader   Encounter Date: 03/06/2017      PT End of Session - 03/06/17 1805    Visit Number 1   Number of Visits 6   Date for PT Re-Evaluation 04/17/17   PT Start Time 1448   PT Stop Time 1535   PT Time Calculation (min) 50 min   Activity Tolerance Patient tolerated treatment well      Past Medical History:  Diagnosis Date  . Coronary artery disease   . MI (myocardial infarction) ([place])   . Orthostatic hypotension 08/2016  . Prostate cancer ([place])   . Stroke [facility])     Past Surgical History:  Procedure Laterality Date  . cardiac stents    . CARDIAC SURGERY    . CORONARY ARTERY BYPASS GRAFT      There were no vitals filed for this visit.       Subjective Assessment - 03/06/17 1456    Subjective Bill reports that he has had pain in his neck and shoulders since 6/18. Symptoms were treated with medication with good relief. He has limited motion in his neck and difficulty turning his head side to side.    Pertinent History See medical history - denies prior neck problems    Diagnostic tests arthritis    Patient Stated Goals prevent recurrent problems    Currently in Pain? No/denies            [facility] PT Assessment - 03/06/17 0001      Assessment   Medical Diagnosis Cervical spasms    Referring Provider Dr Lynne Leader    Onset Date/Surgical Date 01/24/17   Hand Dominance Right   Next MD Visit 8/18   Prior Therapy none for neck      Precautions   Precautions None     Balance Screen   Has the patient fallen in the past 6 months Yes   How many times? 1  today - skinned elbow   Has the patient had a decrease in activity level because of a fear of falling?  No   Is the patient  reluctant to leave their home because of a fear of falling?  No     Home Environment   Additional Comments stays in single level home. 3-4 steps to enter with railing      Prior Function   Level of Independence Independent  lives with son    Vocation Retired   Armed forces technical officer    Leisure Tv; computer; puzzle; exercises every day ~ 1 hour      Observation/Other Assessments   Focus on Therapeutic Outcomes (FOTO)  42% limitation      Posture/Postural Control   Posture Comments head forward shoulders rounded and elevated flexed forward at hips      AROM   Cervical Flexion 42   Cervical Extension 40   Cervical - Right Side Bend 22   Cervical - Left Side Bend 20   Cervical - Right Rotation 50   Cervical - Left Rotation 52     Strength   Overall Strength Comments 5/5 bilat UE's      Palpation   Palpation comment muscular tightness bialt ant/lat/post cervical musculature; upper traps; pecs      Functional Gait  Assessment  Gait assessed  --  ambulates with rolling walker fwd flexed posture             Objective measurements completed on examination: See above findings.          [facility] Adult PT Treatment/Exercise - 03-07-17 0001      Self-Care   Self-Care --  education re sitting posture/positions using noodle w/ chair     Neuro Re-ed    Neuro Re-ed Details  working on posture and alignment focus on chest lift scapulae down and back engaging posterior shoudler girdle      Shoulder Exercises: Standing   Other Standing Exercises axial extension 10 sec x 5; scap squeeze with noodle 10 sec x 10      Shoulder Exercises: Stretch   Other Shoulder Stretches 3 way doorway stretch 30 sec x 2 each positin to pt tolerance                 PT Education - 03/07/17 1527    Education provided Yes   Education Details HEP    Person(s) Educated Patient;Child(ren)   Methods Explanation;Demonstration;Tactile cues;Verbal cues;Handout   Comprehension  Verbalized understanding;Returned demonstration;Verbal cues required;Tactile cues required             PT Long Term Goals - March 07, 2017 1809      PT LONG TERM GOAL #1   Title Improve posture and alignment with patient to demonstrate upright posture with verbal cures 04/17/17   Time 6   Period Weeks   Status New     PT LONG TERM GOAL #2   Title Increase cervical ROM by 5-7 deg in all planes 04/17/17   Time 6   Period Weeks   Status New     PT LONG TERM GOAL #3   Title Independnet in HEP 04/17/17   Time 6   Period Weeks   Status New     PT LONG TERM GOAL #4   Title Improve FOTO to </= 37% limitation 04/17/17   Time 6   Period Weeks   Status New                Plan - 03-07-17 1806    Clinical Impression Statement Rush Landmark presents with resolving cervical and upper trap tightness and pain. He has poor posture and alignment; limited cervical and thoracic mobility and ROM; decresed end range UE ROM; muscular tightness through cervical and thoracic area. Rush Landmark will benefit form PT to address problems identified.    Clinical Presentation Stable   Clinical Decision Making Low   Rehab Potential Good   PT Frequency 1x / week   PT Duration 6 weeks   PT Treatment/Interventions Patient/family education;ADLs/Self Care Home Management;Cryotherapy;Electrical Stimulation;Iontophoresis 37m/ml Dexamethasone;Moist Heat;Ultrasound;Dry needling;Manual techniques;Neuromuscular re-education;Therapeutic activities;Therapeutic exercise   PT Next Visit Plan review HEP; continue work on posture and alignment; posterior shoulder girdle strengthening; add cervical ROM lateral flexion and rotation    Consulted and Agree with Plan of Care Patient      Patient will benefit from skilled therapeutic intervention in order to improve the following deficits and impairments:  Postural dysfunction, Improper body mechanics, Increased muscle spasms, Decreased range of motion, Decreased mobility, Pain  Visit  Diagnosis: Cervicalgia - Plan: PT plan of care cert/re-cert  Abnormal posture - Plan: PT plan of care cert/re-cert      [facility]OD[facility] - [id number]    Functional Assessment Tool Used  Evaluation; FOTO; clinical assessment    Functional Limitations Changing and  maintaining body position   Changing and Maintaining Body Position Current Status [phone]) At least 20 percent but less than 40 percent impaired, limited or restricted   Changing and Maintaining Body Position Goal Status ([id number]) At least 20 percent but less than 40 percent impaired, limited or restricted       Problem List Patient Active Problem List   Diagnosis Date Noted  . Cervical paraspinal muscle spasm 02/18/2017  . MCI (mild cognitive impairment) 10/22/2016  . Orthostatic hypertension 09/13/2016  . Coronary artery disease involving coronary bypass graft of native heart without angina pectoris 09/13/2016  . Abnormal MRI of head 09/13/2016  . Orthostatic hypotension 09/03/2016  . CAD S/P percutaneous coronary angioplasty 09/03/2016  . Hx of CABG 09/03/2016  . Dyslipidemia 09/03/2016  . Coronary artery disease due to lipid rich plaque 08/29/2016  . Hyponatremia 08/29/2016  . Elevated LFTs 08/29/2016  . Normocytic anemia 08/29/2016  . Syncope 08/28/2016    Nicholson Starace Nilda Simmer PT, [facility]  03/06/2017, 6:41 PM  [facility] [address] [place], [place], [address] Phone: [phone]   Fax:  [phone]  Name: Zamauri Nez MRN: [id number] Date of Birth: [birth year]  PHYSICAL THERAPY DISCHARGE SUMMARY  Visits from Start of Care: Evaluation only  Current functional level related to goals / functional outcomes: Unknown    Remaining deficits: Unknown    Education / Equipment: HEP Plan: Patient agrees to discharge.  Patient goals were not met. Patient is being discharged due to not returning since the last visit.  ?????     Gayla Benn P. Helene Kelp PT,  [facility] 05/02/17 1:54 PM

## 2017-03-06 NOTE — Patient Instructions (Addendum)
Shoulder Blade Squeeze    Rotate shoulders back, then squeeze shoulder blades down and back. Hold 10 sec Repeat __10  times. Do __several __ sessions per day. Can use swin noodle   Axial Extension (Chin Tuck)    Pull chin in and lengthen back of neck. Hold __10__ seconds while counting out loud. Repeat _5-10___ times. Do __several__ sessions per day.    Scapular Retraction: Elbow Flexion (Standing)    With elbows bent to 90, pinch shoulder blades together and rotate arms out, keeping elbows bent. Repeat _5-10___ times per set. Do _1-2___ sets per session. Do __2-3__ sessions per day.   Resisted External Rotation: in Neutral - Bilateral   PALMS UP Sit or stand, tubing in both hands, elbows at sides, bent to 90, forearms forward. Pinch shoulder blades together and rotate forearms out. Keep elbows at sides. Repeat __10__ times per set. Do _2-3___ sets per session. Do _2-3___ sessions per day.   Scapula Adduction With Pectoralis Stretch: Low - Standing   Shoulders at 45 hands even with shoulders, keeping weight through legs, shift weight forward until you feel pull or stretch through the front of your chest. Hold _30__ seconds. Do _3__ times, _2-4__ times per day.   Scapula Adduction With Pectoralis Stretch: Mid-Range - Standing   Shoulders at 90 elbows even with shoulders, keeping weight through legs, shift weight forward until you feel pull or strength through the front of your chest. Hold __30_ seconds. Do _3__ times, __2-4_ times per day.   Scapula Adduction With Pectoralis Stretch: High - Standing   Shoulders at 120 hands up high on the doorway, keeping weight on feet, shift weight forward until you feel pull or stretch through the front of your chest. Hold _30__ seconds. Do _3__ times, _2-3__ times per day.

## 2017-03-14 ENCOUNTER — Encounter: Payer: Self-pay | Admitting: *Deleted

## 2017-03-14 ENCOUNTER — Emergency Department (INDEPENDENT_AMBULATORY_CARE_PROVIDER_SITE_OTHER)
Admission: EM | Admit: 2017-03-14 | Discharge: 2017-03-14 | Disposition: A | Discharge status: Home / Self Care | Payer: Medicare Other | Source: Home / Self Care | Attending: Family Medicine | Admitting: Family Medicine

## 2017-03-14 DIAGNOSIS — G8929 Other chronic pain: Secondary | ICD-10-CM | POA: Diagnosis not present

## 2017-03-14 DIAGNOSIS — M545 Low back pain: Secondary | ICD-10-CM

## 2017-03-14 DIAGNOSIS — S51811A Laceration without foreign body of right forearm, initial encounter: Secondary | ICD-10-CM | POA: Diagnosis not present

## 2017-03-14 NOTE — ED Triage Notes (Addendum)
Patient reports falling 5 days ago receiving a skin tear to his RFA that keeps blleding. He is on Plavix. He also c/o right lower back pain that is chronic  But is possible that the fall has made it worse.

## 2017-03-14 NOTE — Discharge Instructions (Signed)
Leave Mepelex Border dressing in place for three days then remove.  May then apply Bacitracin and non-stick bandage daily until healed.

## 2017-03-14 NOTE — ED Provider Notes (Signed)
Thomas Hanna CARE    CSN: [id number] Arrival date & time: 03/14/17  0959     History   Chief Complaint Chief Complaint  Patient presents with  . Skin Tear  . Back Pain    HPI Thomas Hanna is a 83 y.o. male.   Patient lost his balance and scraped his right forearm on house siding five days ago.  He suffered a small skin tear/abrasion that continues to bleed (he takes Plavix).  He denies pain, swelling, or redness at the site.  He also has had a flare-up of chronic low back pain without radiculopathy.  He states that he has had best results from a back injection in the past.   The history is provided by the patient.    Past Medical History:  Diagnosis Date  . Coronary artery disease   . MI (myocardial infarction) ([place])   . Orthostatic hypotension 08/2016  . Prostate cancer ([place])   . Stroke [facility])     Patient Active Problem List   Diagnosis Date Noted  . Cervical paraspinal muscle spasm 02/18/2017  . MCI (mild cognitive impairment) 10/22/2016  . Orthostatic hypertension 09/13/2016  . Coronary artery disease involving coronary bypass graft of native heart without angina pectoris 09/13/2016  . Abnormal MRI of head 09/13/2016  . Orthostatic hypotension 09/03/2016  . CAD S/P percutaneous coronary angioplasty 09/03/2016  . Hx of CABG 09/03/2016  . Dyslipidemia 09/03/2016  . Coronary artery disease due to lipid rich plaque 08/29/2016  . Hyponatremia 08/29/2016  . Elevated LFTs 08/29/2016  . Normocytic anemia 08/29/2016  . Syncope 08/28/2016    Past Surgical History:  Procedure Laterality Date  . cardiac stents    . CARDIAC SURGERY    . CORONARY ARTERY BYPASS GRAFT         Home Medications    Prior to Admission medications   Medication Sig Start Date End Date Taking? Authorizing Provider  cholecalciferol (VITAMIN D) 1000 units tablet Take 1,000 Units by mouth daily.    [provider]  clopidogrel (PLAVIX) 75 MG tablet Take 75 mg by mouth  daily.    [provider]  dorzolamide (TRUSOPT) 2 % ophthalmic solution Place 1 drop into both eyes 2 (two) times daily.    [provider]  FERROUS SULFATE PO Take by mouth.    [provider]  gabapentin (NEURONTIN) 100 MG capsule Take 600 mg by mouth 3 (three) times daily.     [provider]  Ginkgo Biloba 40 MG TABS Take by mouth.    [provider]  latanoprost (XALATAN) 0.005 % ophthalmic solution Place 1 drop into both eyes at bedtime.     [provider]  midodrine (PROAMATINE) 5 MG tablet Take 0.5 tablets (2.5 mg total) by mouth 3 (three) times daily with meals. 10/22/16   Garvin Fila, MD  tamsulosin (FLOMAX) 0.4 MG CAPS capsule Take 0.4 mg by mouth.    [provider]  vitamin B-12 (CYANOCOBALAMIN) 100 MCG tablet Take 100 mcg by mouth daily.    [provider]    Family History Family History  Problem Relation Age of Onset  . Diabetes Mother   . Uterine cancer Sister     Social History Social History  Substance Use Topics  . Smoking status: Former Smoker    Quit date: 1970  . Smokeless tobacco: Never Used  . Alcohol use No     Allergies   Patient has no known allergies.   Review of  Systems Review of Systems  Constitutional: Negative for activity change, appetite change, chills, diaphoresis, fatigue, fever and unexpected weight change.  HENT: Negative.   Eyes: Negative.   Respiratory: Negative.   Cardiovascular: Negative.   Gastrointestinal: Negative.   Genitourinary: Negative.   Musculoskeletal: Positive for back pain.  Skin: Positive for wound. Negative for color change.  Neurological: Negative for dizziness and headaches.     Physical Exam Triage Vital Signs ED Triage Vitals  Enc Vitals Group     BP 03/14/17 1015 (!) 152/71     Pulse Rate 03/14/17 1015 70     Resp --      Temp 03/14/17 1015 98.4 F (36.9 C)     Temp Source 03/14/17 1015 Oral     SpO2 03/14/17 1015 100 %      Weight 03/14/17 1016 170 lb (77.1 kg)     Height --      Head Circumference --      Peak Flow --      Pain Score 03/14/17 1016 9     Pain Loc --      Pain Edu? --      Excl. in [place]? --    No data found.   Updated Vital Signs BP (!) 152/71 (BP Location: Left Arm)   Pulse 70   Temp 98.4 F (36.9 C) (Oral)   Wt 170 lb (77.1 kg)   SpO2 100%   BMI 23.38 kg/m   Visual Acuity Right Eye Distance:   Left Eye Distance:   Bilateral Distance:    Right Eye Near:   Left Eye Near:    Bilateral Near:     Physical Exam  Constitutional: He appears well-developed and well-nourished. No distress.  HENT:  Head: Atraumatic.  Right Ear: External ear normal.  Left Ear: External ear normal.  Nose: Nose normal.  Eyes: Pupils are equal, round, and reactive to light.  Neck: Normal range of motion.  Cardiovascular: Normal rate.   Pulmonary/Chest: Effort normal.  Musculoskeletal:       Arms: Right forearm has a small triangular skin tear measuring 1cm by 44mm.  There is a small amount of persistent bleeding present.  No surrounding erythema, tenderness, warmth, or swelling.  Neurological: He is alert.  Skin: Skin is warm and dry.  Nursing note and vitals reviewed.    UC Treatments / Results  Labs (all labs ordered are listed, but only abnormal results are displayed) Labs Reviewed - No data to display  EKG  EKG Interpretation None       Radiology No results found.  Procedures Procedures  With sterile technique, debrided skin tear right forearm.  Applied small amount of Celox hemostatic agent powder, followed by Bacitracin and Mepelex Border dressing.  Secured with light wrap of CoFlex.  Medications Ordered in UC Medications - No data to display   Initial Impression / Assessment and Plan / UC Course  I have reviewed the triage vital signs and the nursing notes.  Pertinent labs & imaging results that were available during my care of the patient were reviewed by me and  considered in my medical decision making (see chart for details).    Leave Mepelex Border dressing in place for three days then remove.  May then apply Bacitracin and non-stick bandage daily until healed. Followup with Dr. Georgina Snell for management of chronic lower back pain.    Final Clinical Impressions(s) / UC Diagnoses   Final diagnoses:  Skin tear of right forearm without complication,  initial encounter  Chronic bilateral low back pain without sciatica    New Prescriptions New Prescriptions   No medications on file     Kandra Nicolas, MD 03/14/17 1105

## 2017-03-17 ENCOUNTER — Encounter: Payer: Self-pay | Admitting: Family Medicine

## 2017-03-17 ENCOUNTER — Ambulatory Visit (INDEPENDENT_AMBULATORY_CARE_PROVIDER_SITE_OTHER): Payer: Medicare Other | Admitting: Family Medicine

## 2017-03-17 VITALS — BP 174/69 | HR 53 | Ht 71.0 in | Wt 174.0 lb

## 2017-03-17 DIAGNOSIS — I639 Cerebral infarction, unspecified: Secondary | ICD-10-CM | POA: Diagnosis not present

## 2017-03-17 DIAGNOSIS — M7061 Trochanteric bursitis, right hip: Secondary | ICD-10-CM

## 2017-03-17 NOTE — Patient Instructions (Signed)
Thank you for coming in today. Return as needed.  Call or go to the ER if you develop a large red swollen joint with extreme pain or oozing puss.    Hip Bursitis Hip bursitis is swelling of a fluid-filled sac (bursa) in your hip. This swelling (inflammation) can be painful. This condition may come and go over time. Follow these instructions at home: Medicines  Take over-the-counter and prescription medicines only as told by your doctor.  Do not drive or use heavy machinery while taking prescription pain medicine, or as told by your doctor.  If you were prescribed an antibiotic medicine, take it as told by your doctor. Do not stop taking the antibiotic even if you start to feel better. Activity  Return to your normal activities as told by your doctor. Ask your doctor what activities are safe for you.  Rest and protect your hip until you feel better. General instructions  Wear wraps that put pressure on your hip (compression wraps) only as told by your doctor.  Raise (elevate) your hip above the level of your heart as much as you can. To do this, try putting a pillow under your hips while you lie down. Stop if this causes pain.  Do not use your hip to support your body weight until your doctor says that you can.  Use crutches as told by your doctor.  Gently rub and stretch your injured area as often as is comfortable.  Keep all follow-up visits as told by your doctor. This is important. How is this prevented?  Exercise regularly, as told by your doctor.  Warm up and stretch before being active.  Cool down and stretch after being active.  Avoid activities that bother your hip or cause pain.  Avoid sitting down for long periods at a time. Contact a doctor if:  You have a fever.  You get new symptoms.  You have trouble walking.  You have trouble doing everyday activities.  You have pain that gets worse.  You have pain that does not get better with medicine.  You  get red skin on your hip area.  You get a feeling of warmth in your hip area. Get help right away if:  You cannot move your hip.  You have very bad pain. This information is not intended to replace advice given to you by your health care provider. Make sure you discuss any questions you have with your health care provider. Document Released: 08/31/2010 Document Revised: 01/04/2016 Document Reviewed: 02/28/2015 Elsevier Interactive Patient Education  [employer].

## 2017-03-17 NOTE — Progress Notes (Signed)
Thomas Hanna is a 83 y.o. male who presents to [facility] [place] today for chronic right hip pain.  Patient reports chronic hip pain localized to the area of the right greater trochanter. He states that he gets the pain about once a year. He typically receives an injection for the pain, but is unable to provide additional details on these injections. His last injection was about 1 year ago. Patient states that the pain is worst when rising from a chair. He grades it as a 10/10 in severity during these times. Patient states that this episode of pain has been present for about 1 month and has progressively worsened over the last few days. He states that it is painful to lay on his right side due to the pain. Patient denies any radiation of the pain, numbness or tingling.   Patient denies any fevers, chills, night sweats, abdominal pain, shortness of breath, changes in bowel movements, or changes in urination.   Past Medical History:  Diagnosis Date  . Coronary artery disease   . MI (myocardial infarction) ([place])   . Orthostatic hypotension 08/2016  . Prostate cancer ([place])   . Stroke [facility])    Past Surgical History:  Procedure Laterality Date  . cardiac stents    . CARDIAC SURGERY    . CORONARY ARTERY BYPASS GRAFT     Social History  Substance Use Topics  . Smoking status: Former Smoker    Quit date: 1970  . Smokeless tobacco: Never Used  . Alcohol use No   family history includes Diabetes in his mother; Uterine cancer in his sister.  ROS as above:  Medications: Current Outpatient Prescriptions  Medication Sig Dispense Refill  . cholecalciferol (VITAMIN D) 1000 units tablet Take 1,000 Units by mouth daily.    . clopidogrel (PLAVIX) 75 MG tablet Take 75 mg by mouth daily.    . dorzolamide (TRUSOPT) 2 % ophthalmic solution Place 1 drop into both eyes 2 (two) times  daily.    Marland Kitchen FERROUS SULFATE PO Take by mouth.    . gabapentin (NEURONTIN) 100 MG capsule Take 600 mg by mouth 3 (three) times daily.     . Ginkgo Biloba 40 MG TABS Take by mouth.    . latanoprost (XALATAN) 0.005 % ophthalmic solution Place 1 drop into both eyes at bedtime.     . midodrine (PROAMATINE) 5 MG tablet Take 0.5 tablets (2.5 mg total) by mouth 3 (three) times daily with meals. 90 tablet 3  . tamsulosin (FLOMAX) 0.4 MG CAPS capsule Take 0.4 mg by mouth.    . vitamin B-12 (CYANOCOBALAMIN) 100 MCG tablet Take 100 mcg by mouth daily.     No current facility-administered medications for this visit.    No Known Allergies  Health Maintenance Health Maintenance  Topic Date Due  . TETANUS/TDAP  09/16/1942  . PNA vac Low Risk Adult (1 of 2 - PCV13) 09/16/1990  . INFLUENZA VACCINE  03/12/2017     Exam:  BP (!) 174/69   Pulse (!) 53   Ht 5\' 11"  (1.803 m)   Wt 174 lb (78.9 kg)   BMI 24.27 kg/m  Gen: Well NAD HEENT: EOMI,  MMM Lungs: Normal work of breathing. CTABL Heart: RRR, normal S1 and S2, no MRG Abd: NABS, Soft. Nondistended, Nontender Exts: Brisk capillary refill, warm and well perfused.  MSK: Tenderness to palpation at area of right greater trochanter, pain is reproducible with hip abduction  against resistance, weakness of hip abductors present.  Hip greater trochanteric injection: Right Consent obtained and timeout performed. Area of maximum tenderness palpated and identified. Skin cleaned with alcohol, cold spray applied. A 25 gauge needle was used to access the greater trochanteric bursa. 80 mg of Depo-Medrol, and 4 mL of Marcaine were used to inject the trochanteric bursa. Patient tolerated the procedure well.    No results found for this or any previous visit (from the past 72 hour(s)). No results found.    Assessment and Plan: 83 y.o. male with chronic right hip pain. Given patient's presentation and physical exam, this is most likely greater  trochanteric bursitis. Steroid injection at the greater trochanter was done in clinic today. Patient advised to follow-up in clinic if pain does not improve or worsens.    No orders of the defined types were placed in this encounter.  No orders of the defined types were placed in this encounter.    Discussed warning signs or symptoms. Please see discharge instructions. Patient expresses understanding.

## 2017-03-20 ENCOUNTER — Encounter: Payer: Medicare Other | Admitting: Rehabilitative and Restorative Service Providers"

## 2017-03-26 ENCOUNTER — Encounter: Payer: Self-pay | Admitting: *Deleted

## 2017-03-26 ENCOUNTER — Emergency Department (INDEPENDENT_AMBULATORY_CARE_PROVIDER_SITE_OTHER)
Admission: EM | Admit: 2017-03-26 | Discharge: 2017-03-26 | Disposition: A | Discharge status: Home / Self Care | Payer: Medicare Other | Source: Home / Self Care | Attending: Family Medicine | Admitting: Family Medicine

## 2017-03-26 ENCOUNTER — Telehealth: Payer: Self-pay | Admitting: Emergency Medicine

## 2017-03-26 ENCOUNTER — Other Ambulatory Visit: Payer: Self-pay | Admitting: Emergency Medicine

## 2017-03-26 DIAGNOSIS — R197 Diarrhea, unspecified: Secondary | ICD-10-CM | POA: Diagnosis not present

## 2017-03-26 LAB — COMPLETE METABOLIC PANEL WITH GFR
ALT: 13 U/L (ref 9–46)
AST: 19 U/L (ref 10–35)
Albumin: 4.3 g/dL (ref 3.6–5.1)
Alkaline Phosphatase: 100 U/L (ref 40–115)
BUN: 20 mg/dL (ref 7–25)
CO2: 26 mmol/L (ref 20–32)
Calcium: 9.2 mg/dL (ref 8.6–10.3)
Chloride: 101 mmol/L (ref 98–110)
Creat: 0.85 mg/dL (ref 0.70–1.11)
GFR, Est African American: 88 mL/min (ref >=60)
GFR, Est Non African American: 76 mL/min (ref >=60)
Glucose, Bld: 95 mg/dL (ref 65–99)
Potassium: 4.6 mmol/L (ref 3.5–5.3)
Sodium: 137 mmol/L (ref 135–146)
Total Bilirubin: 0.8 mg/dL (ref 0.2–1.2)
Total Protein: 7 g/dL (ref 6.1–8.1)

## 2017-03-26 LAB — POCT CBC W AUTO DIFF (K'VILLE URGENT CARE)

## 2017-03-26 MED ORDER — AZITHROMYCIN 500 MG PO TABS: 500.0000 mg | ORAL_TABLET | Freq: Every day | ORAL | 0 refills | Status: DC

## 2017-03-26 NOTE — ED Triage Notes (Signed)
Patient c/o 2 days of diarrhea. 10 bouts per day. Denies fever, nausea, vomiting or abdominal pain. Taken Imodium otc with some relief.

## 2017-03-26 NOTE — Discharge Instructions (Signed)
° °  Please do not stat the antibiotic until you are able to provide a stool sample.  If you are not able to provide a significant stool sample, that is okay.  Your symptoms could be related to a 24 hour viral illness.  Be sure to stay well hydrated and replenish nutrients lost through diarrhea.  You may drink water with added fruits such as lemon water, raspberries and blue berries, however, sports drinks such as Gatorade or Pedialyte can also help quickly replenish electrolytes and give you more energy.  If these drinks are too strong tasting, it is okay to water them down.

## 2017-03-26 NOTE — ED Provider Notes (Signed)
[facility]    CSN: [id number] Arrival date & time: 03/26/17  6578   History   Chief Complaint Chief Complaint  Patient presents with  . Diarrhea    HPI Atif Chapple is a 83 y.o. male.   HPI  Joffrey Kerce is a 83 y.o. male presenting to UC with son with concern for watery diarrhea over the last 2 days.  He has had up to 10 episodes of diarrhea per day.  He did take OTC imodium yesterday, which seems to have provided some relief.  Pt denies fever, chills, nausea, vomiting or abdominal pain. No recent travel or sick contacts. He cannot recall anything bad he may have eaten.     Past Medical History:  Diagnosis Date  . Coronary artery disease   . MI (myocardial infarction) ([place])   . Orthostatic hypotension 08/2016  . Prostate cancer ([place])   . Stroke [facility])     Patient Active Problem List   Diagnosis Date Noted  . Cervical paraspinal muscle spasm 02/18/2017  . MCI (mild cognitive impairment) 10/22/2016  . Orthostatic hypertension 09/13/2016  . Coronary artery disease involving coronary bypass graft of native heart without angina pectoris 09/13/2016  . Abnormal MRI of head 09/13/2016  . Orthostatic hypotension 09/03/2016  . CAD S/P percutaneous coronary angioplasty 09/03/2016  . Hx of CABG 09/03/2016  . Dyslipidemia 09/03/2016  . Coronary artery disease due to lipid rich plaque 08/29/2016  . Hyponatremia 08/29/2016  . Elevated LFTs 08/29/2016  . Normocytic anemia 08/29/2016  . Syncope 08/28/2016    Past Surgical History:  Procedure Laterality Date  . cardiac stents    . CARDIAC SURGERY    . CORONARY ARTERY BYPASS GRAFT         Home Medications    Prior to Admission medications   Medication Sig Start Date End Date Taking? Authorizing Provider  azithromycin (ZITHROMAX) 500 MG tablet Take 1 tablet (500 mg total) by mouth daily. For 3 days 03/26/17   Noe Gens, PA-C  cholecalciferol (VITAMIN D) 1000 units tablet Take 1,000 Units by mouth  daily.    [provider]  clopidogrel (PLAVIX) 75 MG tablet Take 75 mg by mouth daily.    [provider]  dorzolamide (TRUSOPT) 2 % ophthalmic solution Place 1 drop into both eyes 2 (two) times daily.    [provider]  FERROUS SULFATE PO Take by mouth.    [provider]  gabapentin (NEURONTIN) 100 MG capsule Take 600 mg by mouth 3 (three) times daily.     [provider]  Ginkgo Biloba 40 MG TABS Take by mouth.    [provider]  latanoprost (XALATAN) 0.005 % ophthalmic solution Place 1 drop into both eyes at bedtime.     [provider]  midodrine (PROAMATINE) 5 MG tablet Take 0.5 tablets (2.5 mg total) by mouth 3 (three) times daily with meals. 10/22/16   Garvin Fila, MD  tamsulosin (FLOMAX) 0.4 MG CAPS capsule Take 0.4 mg by mouth.    [provider]  vitamin B-12 (CYANOCOBALAMIN) 100 MCG tablet Take 100 mcg by mouth daily.    [provider]    Family History Family History  Problem Relation Age of Onset  . Diabetes Mother   . Uterine cancer Sister     Social History Social History  Substance Use Topics  . Smoking status: Former Smoker    Quit date: 1970  . Smokeless tobacco: Never Used  . Alcohol use No  Allergies   Patient has no known allergies.   Review of Systems Review of Systems  Constitutional: Negative for appetite change, chills, fatigue and fever.  Cardiovascular: Negative for chest pain and palpitations.  Gastrointestinal: Positive for diarrhea. Negative for abdominal pain, blood in stool, constipation, nausea and vomiting.  Genitourinary: Negative for decreased urine volume, dysuria, flank pain and frequency.  Musculoskeletal: Negative for back pain and myalgias.  Neurological: Negative for dizziness, light-headedness and headaches.     Physical Exam Triage Vital Signs ED Triage Vitals  Enc Vitals Group     BP 03/26/17 0848 109/66     Pulse Rate 03/26/17 0848  83     Resp --      Temp 03/26/17 0848 (!) 97.5 F (36.4 C)     Temp Source 03/26/17 0848 Oral     SpO2 03/26/17 0848 95 %     Weight 03/26/17 0848 164 lb 12.8 oz (74.8 kg)     Height --      Head Circumference --      Peak Flow --      Pain Score 03/26/17 0849 0     Pain Loc --      Pain Edu? --      Excl. in [place]? --    No data found.   Updated Vital Signs BP 109/66 (BP Location: Left Arm)   Pulse 83   Temp (!) 97.5 F (36.4 C) (Oral)   Wt 164 lb 12.8 oz (74.8 kg)   SpO2 95%   BMI 22.98 kg/m     Physical Exam  Constitutional: He is oriented to person, place, and time. He appears well-developed and well-nourished. No distress.  HENT:  Head: Normocephalic and atraumatic.  Mouth/Throat: Oropharynx is clear and moist.  Eyes: Pupils are equal, round, and reactive to light. EOM are normal.  Neck: Normal range of motion.  Cardiovascular: Normal rate and regular rhythm.   Pulmonary/Chest: Effort normal and breath sounds normal. No respiratory distress. He has no wheezes. He has no rales.  Abdominal: Soft. Bowel sounds are normal. He exhibits no distension and no mass. There is no tenderness. There is no rebound and no guarding. No hernia.  Musculoskeletal: Normal range of motion.  Neurological: He is alert and oriented to person, place, and time.  Skin: Skin is warm and dry. He is not diaphoretic.  Psychiatric: He has a normal mood and affect. His behavior is normal.  Nursing note and vitals reviewed.    UC Treatments / Results  Labs (all labs ordered are listed, but only abnormal results are displayed) Labs Reviewed  COMPLETE METABOLIC PANEL WITH GFR  GASTROINTESTINAL PATHOGEN PANEL PCR  POCT CBC W AUTO DIFF ([place])    EKG  EKG Interpretation None       Radiology No results found.  Procedures Procedures (including critical care time)  Medications Ordered in UC Medications - No data to display   Initial Impression / Assessment and Plan /  UC Course  I have reviewed the triage vital signs and the nursing notes.  Pertinent labs & imaging results that were available during my care of the patient were reviewed by me and considered in my medical decision making (see chart for details).     Hx and exam c/w likely infectious diarrhea.  Pt appears well. NAD. Benign abdominal exam. Vitals: WNL CBC: unremarkable CMP: Pending GI PCR: pt unable to provide sample in UC, will try to bring back later today  Final Clinical  Impressions(s) / UC Diagnoses   Final diagnoses:  Diarrhea of presumed infectious origin   Encouraged to provide stool sample if possible. Unable to provide sample in UC. Encouraged to stay well hydrated and stick with bland diet until stools start reforming, gradually start regular diet. Prescription for Azithromycin provided due to concern for possible infectious diarrhea, however, advised pt not to start until sample is provided in order to not skew results F/u with PCP in 4-5 days if not improving,  Discussed symptoms that warrant emergent care in the ED.   New Prescriptions Current Discharge Medication List    START taking these medications   Details  azithromycin (ZITHROMAX) 500 MG tablet Take 1 tablet (500 mg total) by mouth daily. For 3 days Qty: 3 tablet, Refills: 0         Controlled Substance Prescriptions [place] Controlled Substance Registry consulted? Not Applicable   Tyrell Antonio 03/26/17 1191

## 2017-03-28 NOTE — Telephone Encounter (Signed)
LMOM following up on [place] after visit. Advising 1 lab is back and WNL. There is still 1 lab pending. We will call once it comes in. Call back as needed. Please advise [name] of this message as the only number listed is his son [name].

## 2017-03-31 ENCOUNTER — Telehealth: Payer: Self-pay

## 2017-03-31 LAB — GASTROINTESTINAL PATHOGEN PANEL PCR
C. difficile Tox A/B, PCR: NOT DETECTED
Campylobacter, PCR: NOT DETECTED
Cryptosporidium, PCR: NOT DETECTED
E coli (ETEC) LT/ST PCR: NOT DETECTED
E coli (STEC) stx1/stx2, PCR: NOT DETECTED
E coli 0157, PCR: NOT DETECTED
Giardia lamblia, PCR: DETECTED — CR
Norovirus, PCR: NOT DETECTED
Rotavirus A, PCR: NOT DETECTED
Salmonella, PCR: NOT DETECTED
Shigella, PCR: NOT DETECTED

## 2017-03-31 MED ORDER — TINIDAZOLE 500 MG PO TABS: 2.0000 g | ORAL_TABLET | Freq: Once | ORAL | 0 refills | Status: AC

## 2017-03-31 NOTE — Telephone Encounter (Signed)
Left a message with lab results and follow up.

## 2017-03-31 NOTE — Telephone Encounter (Signed)
Left message on VM with CMP results, and told to call if questions or concerns.  Explained that GI panel will be back in the next 2 days, and we will call with the results.

## 2017-04-01 NOTE — Telephone Encounter (Signed)
Spoke with patient's son [name] and advised him of GI panel results. He plans to pick up Rx at [facility] previously sent.

## 2017-04-03 ENCOUNTER — Ambulatory Visit: Payer: Medicare Other | Admitting: Family Medicine

## 2017-04-28 DIAGNOSIS — Z23 Encounter for immunization: Secondary | ICD-10-CM | POA: Diagnosis not present

## 2017-05-15 ENCOUNTER — Ambulatory Visit (INDEPENDENT_AMBULATORY_CARE_PROVIDER_SITE_OTHER): Payer: Medicare Other | Admitting: Cardiovascular Disease

## 2017-05-15 ENCOUNTER — Encounter: Payer: Self-pay | Admitting: Cardiovascular Disease

## 2017-05-15 VITALS — BP 170/62 | HR 60 | Ht 71.5 in | Wt 173.0 lb

## 2017-05-15 DIAGNOSIS — Z951 Presence of aortocoronary bypass graft: Secondary | ICD-10-CM

## 2017-05-15 DIAGNOSIS — I639 Cerebral infarction, unspecified: Secondary | ICD-10-CM

## 2017-05-15 DIAGNOSIS — I25810 Atherosclerosis of coronary artery bypass graft(s) without angina pectoris: Secondary | ICD-10-CM

## 2017-05-15 DIAGNOSIS — Z9861 Coronary angioplasty status: Secondary | ICD-10-CM | POA: Diagnosis not present

## 2017-05-15 DIAGNOSIS — I951 Orthostatic hypotension: Secondary | ICD-10-CM | POA: Diagnosis not present

## 2017-05-15 DIAGNOSIS — E785 Hyperlipidemia, unspecified: Secondary | ICD-10-CM

## 2017-05-15 DIAGNOSIS — R930 Abnormal findings on diagnostic imaging of skull and head, not elsewhere classified: Secondary | ICD-10-CM | POA: Diagnosis not present

## 2017-05-15 DIAGNOSIS — I2510 Atherosclerotic heart disease of native coronary artery without angina pectoris: Secondary | ICD-10-CM

## 2017-05-15 DIAGNOSIS — I6381 Other cerebral infarction due to occlusion or stenosis of small artery: Secondary | ICD-10-CM

## 2017-05-15 MED ORDER — MIDODRINE HCL 5 MG PO TABS: 2.5000 mg | ORAL_TABLET | Freq: Two times a day (BID) | ORAL | 3 refills | Status: DC

## 2017-05-15 NOTE — Progress Notes (Signed)
l °

## 2017-05-15 NOTE — Patient Instructions (Signed)
Medication Instructions:  DECREASE midodrine to 2.5 mg two times daily  Follow-Up: Your physician wants you to follow-up in: 4-6 MONTHS with Dr. Claiborne Billings. You will receive a reminder letter in the mail two months in advance. If you don't receive a letter, please call our office to schedule the follow-up appointment.   Any Other Special Instructions Will Be Listed Below (If Applicable).   Please make follow up appointment with Neurologist    If you need a refill on your cardiac medications before your next appointment, please call your pharmacy.

## 2017-05-15 NOTE — Progress Notes (Signed)
Cardiology Office Note    Date:  05/22/2017   ID:  Thomas Hanna, DOB October 04, 1923, MRN [id number]  PCP:  System, Pcp Not In  Cardiologist:  Shelva Majestic, MD   Initial evaluation with me  History of Present Illness:  Thomas Hanna is a 83 y.o. male who presents to the office today for initial evaluation with me, but follow-up evaluation of his hospitalization and probable recent silent thalamic stroke and orthostatic hypotension.  Thomas Hanna had presented to our office and was seen by Kerin Ransom on 09/03/2016.  He has a history of CAD, status post CBG surgery in 2007, with subsequent interventions in 2013 and 14.  He is originally from [place] and is previous surgeries and interventions were done in [place] at [facility].  When seen by Kerin Ransom, he was orthostatic in the waiting room and had an episode of garbled speech and became confused after sitting up for 10-15 minutes.  I was DOD that day and we arranged the patient be admitted to the hospital for further evaluation and treatment.  She'll be seen by neurology.  MRI indicated probable subacute (thalamic infarction with exact onset being unclear.  The MRI of the brain showed a 14 mm left thalamic subacute infarct.  CT angina showed 50% are instant origin stenosis of the left posterior cerebral artery and bilateral 65-70% internal carotid artery stenoses with calcification.  A transthoracic echo showed normal EF.  Hemoglobin A1c was 5.7.  Remotely, the patient had a history of statin intolerance.  He has been followed by Dr. Leonie Man.  He was started on Plavix.  Echo Doppler study had shown an EF of 55-60% with subaortic calcification which seem to arise from the anterior mitral leaflet.  Patient is now living in [place] with his son and daughter-in-law.  He has decreased short-term memory.  He denies chest pain, shortness of breath.  He denies any recurrent symptoms of carpal speech or hemiparesis.  He presents for a follow-up office  visit.   Past Medical History:  Diagnosis Date  . Coronary artery disease   . MI (myocardial infarction) ([place])   . Orthostatic hypotension 08/2016  . Prostate cancer ([place])   . Stroke [facility])     Past Surgical History:  Procedure Laterality Date  . cardiac stents    . CARDIAC SURGERY    . CORONARY ARTERY BYPASS GRAFT      Current Medications: Outpatient Medications Prior to Visit  Medication Sig Dispense Refill  . cholecalciferol (VITAMIN D) 1000 units tablet Take 1,000 Units by mouth daily.    . clopidogrel (PLAVIX) 75 MG tablet Take 75 mg by mouth daily.    . dorzolamide (TRUSOPT) 2 % ophthalmic solution Place 1 drop into both eyes 2 (two) times daily.    Marland Kitchen FERROUS SULFATE PO Take by mouth.    . gabapentin (NEURONTIN) 100 MG capsule Take 600 mg by mouth 3 (three) times daily. Pt takes 600 mg in the morning and 300 mg at bedtime    . latanoprost (XALATAN) 0.005 % ophthalmic solution Place 1 drop into both eyes at bedtime.     . tamsulosin (FLOMAX) 0.4 MG CAPS capsule Take 0.4 mg by mouth.    . vitamin B-12 (CYANOCOBALAMIN) 100 MCG tablet Take 100 mcg by mouth daily.    . midodrine (PROAMATINE) 5 MG tablet Take 0.5 tablets (2.5 mg total) by mouth 3 (three) times daily with meals. 90 tablet 3  . azithromycin (ZITHROMAX) 500 MG tablet Take 1 tablet (  500 mg total) by mouth daily. For 3 days 3 tablet 0  . Ginkgo Biloba 40 MG TABS Take by mouth.     No facility-administered medications prior to visit.      Allergies:   Patient has no known allergies.   Social History   Social History  . Marital status: Widowed    Spouse name: N/A  . Number of children: N/A  . Years of education: N/A   Social History Main Topics  . Smoking status: Former Smoker    Quit date: 1970  . Smokeless tobacco: Never Used  . Alcohol use No  . Drug use: No  . Sexual activity: Not Asked   Other Topics Concern  . None   Social History Narrative  . None     Family History:  The patient's family  history includes Diabetes in his mother; Uterine cancer in his sister.   ROS General: Negative; No fevers, chills, or night sweats;  HEENT: Negative; No changes in vision or hearing, sinus congestion, difficulty swallowing Pulmonary: Negative; No cough, wheezing, shortness of breath, hemoptysis Cardiovascular: Negative; No chest pain, presyncope, syncope, palpitations GI: Negative; No nausea, vomiting, diarrhea, or abdominal pain GU: Negative; No dysuria, hematuria, or difficulty voiding Musculoskeletal: Negative; no myalgias, joint pain, or weakness Hematologic/Oncology: Negative; no easy bruising, bleeding Endocrine: Negative; no heat/cold intolerance; no diabetes Neuro: Negative; no changes in balance, headaches Skin: Negative; No rashes or skin lesions Psychiatric: Negative; No behavioral problems, depression Sleep: Negative; No snoring, daytime sleepiness, hypersomnolence, bruxism, restless legs, hypnogognic hallucinations, no cataplexy Other comprehensive 14 point system review is negative.   PHYSICAL EXAM:   VS:  BP (!) 170/62   Pulse 60   Ht 5' 11.5" (1.816 m)   Wt 173 lb (78.5 kg)   BMI 23.79 kg/m     Repeat blood pressure by me was 150/70 supine and 140/70 standing. Wt Readings from Last 3 Encounters:  05/15/17 173 lb (78.5 kg)  03/26/17 164 lb 12.8 oz (74.8 kg)  03/17/17 174 lb (78.9 kg)    General: Alert, oriented, no distress.  Skin: normal turgor, no rashes, warm and dry HEENT: Normocephalic, atraumatic. Pupils equal round and reactive to light; sclera anicteric; extraocular muscles intact; Fundi No hemorrhages or exudate Nose without nasal septal hypertrophy Mouth/Parynx benign; Mallinpatti scale 2 Neck: No JVD, no carotid bruits; normal carotid upstroke Lungs: clear to ausculatation and percussion; no wheezing or rales Chest wall: without tenderness to palpitation Heart: PMI not displaced, RRR, s1 s2 normal, 1/6 systolic murmur, no diastolic murmur, no rubs,  gallops, thrills, or heaves Abdomen: soft, nontender; no hepatosplenomehaly, BS+; abdominal aorta nontender and not dilated by palpation. Back: no CVA tenderness Pulses 2+ Musculoskeletal: full range of motion, normal strength, no joint deformities Extremities: no clubbing cyanosis or edema, Homan's sign negative  Neurologic: grossly nonfocal; Cranial nerves grossly wnl Psychologic: Normal mood and affect   Studies/Labs Reviewed:   EKG:  EKG is ordered today.  ECG (independently read by me): Sinus rhythm at 60 with PAC.  PR interval 170 ms.  QTc interval 412 ms.  Recent Labs: BMP Latest Ref Rng & Units 03/26/2017 02/14/2017 09/06/2016  Glucose 65 - 99 mg/dL 95 94 110(H)  BUN 7 - 25 mg/dL 20 22 13   Creatinine 0.70 - 1.11 mg/dL 0.85 0.78 0.76  Sodium 135 - 146 mmol/L 137 136 134(L)  Potassium 3.5 - 5.3 mmol/L 4.6 4.5 4.3  Chloride 98 - 110 mmol/L 101 99 101  CO2 20 - 32  mmol/L 26 29 25   Calcium 8.6 - 10.3 mg/dL 9.2 9.3 9.0     Hepatic Function Latest Ref Rng & Units 03/26/2017 02/14/2017 09/03/2016  Total Protein 6.1 - 8.1 g/dL 7.0 7.1 6.6  Albumin 3.6 - 5.1 g/dL 4.3 4.6 3.7  AST 10 - 35 U/L 19 22 23   ALT 9 - 46 U/L 13 13 16(L)  Alk Phosphatase 40 - 115 U/L 100 73 58  Total Bilirubin 0.2 - 1.2 mg/dL 0.8 0.4 0.9  Bilirubin, Direct 0.1 - 0.5 mg/dL - - -    CBC Latest Ref Rng & Units 02/14/2017 09/03/2016 09/03/2016  WBC 3.8 - 10.8 K/uL 8.5 8.1 -  Hemoglobin 13.2 - 17.1 g/dL 12.7(L) 11.1(L) 12.2(L)  Hematocrit 38.5 - 50.0 % 38.9 33.9(L) 36.0(L)  Platelets 140 - 400 K/uL 439(H) 266 -   Lab Results  Component Value Date   MCV 93.1 02/14/2017   MCV 88.1 09/03/2016   MCV 88.2 09/03/2016   Lab Results  Component Value Date   TSH 2.422 09/04/2016   Lab Results  Component Value Date   HGBA1C 5.7 (H) 09/05/2016     BNP No results found for: BNP  ProBNP No results found for: PROBNP   Lipid Panel  No results found for: CHOL, TRIG, HDL, CHOLHDL, VLDL, LDLCALC,  LDLDIRECT   RADIOLOGY: No results found.   Additional studies/ records that were reviewed today include:  I reviewed the recent office records, hospitalization, image studies, and neurologic evaluations    ASSESSMENT:    1. Coronary artery disease involving coronary bypass graft of native heart without angina pectoris   2. Abnormal MRI of head   3. CAD S/P percutaneous coronary angioplasty   4. Hx of CABG   5. Dyslipidemia   6. Orthostatic hypotension   7. Thalamic infarction [facility])      PLAN:  Thomas Hanna is a 83 year old male who has known CAD and while living in [place] had undergone CABG revascularization surgery in 2007, and interventions in 2013 and 2014.  He had been seen earlier this year and had significant orthostatic symptoms with transient garbled speech and was felt to have had a subacute thalamic infarct.  I reviewed his neurologic records and imaging studies.  Presently, he is not orthostatic and has been taking Midrin 2.5 mg 3 times a day in addition to Plavix 75 mg daily, gabapentin 600 mg 3 times a day for neuropathy.  Reportedly, there is a history of statin intolerance.  With his blood pressure being stable.  I am suggesting he try reducing Midodrin to twice daily rather than every 8 hours.  His ECG today shows normal sinus rhythm at 60 with rare PAC.  There are no ECG changes of ischemia.  Intervals are normal.  He is not having any anginal type symptomatology.  He is unaware of any palpitations.  He is tolerating Plavix for his prior stroke.  When he had seen the neurologist in light of his memory issues.  They have discussed potential nutritional supplementation like chromogen or 7 trawl and it suggested the possibility of a repeat MRI of the brain in the future.  He would be worthwhile to assess lipid studies and if he is statin intolerant.  He may be a candidate for Zetia might 10 mg, or other therapy.  We will try to get his records from [place] at all  possible.  I will see him in follow-up in 4-6 months or sooner if problems arise.   Medication  Adjustments/Labs and Tests Ordered: Current medicines are reviewed at length with the patient today.  Concerns regarding medicines are outlined above.  Medication changes, Labs and Tests ordered today are listed in the Patient Instructions below. Patient Instructions  Medication Instructions:  DECREASE midodrine to 2.5 mg two times daily  Follow-Up: Your physician wants you to follow-up in: 4-6 MONTHS with Dr. Claiborne Billings. You will receive a reminder letter in the mail two months in advance. If you don't receive a letter, please call our office to schedule the follow-up appointment.   Any Other Special Instructions Will Be Listed Below (If Applicable).   Please make follow up appointment with Neurologist    If you need a refill on your cardiac medications before your next appointment, please call your pharmacy.      Signed, Shelva Majestic, MD  05/22/2017 1:34 PM    [facility] [address] [place], [place], [place]  [address] Phone: [phone]

## 2017-07-15 ENCOUNTER — Telehealth: Payer: Self-pay | Admitting: Cardiovascular Disease

## 2017-07-15 DIAGNOSIS — H02135 Senile ectropion of left lower eyelid: Secondary | ICD-10-CM | POA: Diagnosis not present

## 2017-07-15 DIAGNOSIS — H0100A Unspecified blepharitis right eye, upper and lower eyelids: Secondary | ICD-10-CM | POA: Diagnosis not present

## 2017-07-15 DIAGNOSIS — H02132 Senile ectropion of right lower eyelid: Secondary | ICD-10-CM | POA: Diagnosis not present

## 2017-07-15 DIAGNOSIS — H401134 Primary open-angle glaucoma, bilateral, indeterminate stage: Secondary | ICD-10-CM | POA: Diagnosis not present

## 2017-07-15 DIAGNOSIS — H0100B Unspecified blepharitis left eye, upper and lower eyelids: Secondary | ICD-10-CM | POA: Diagnosis not present

## 2017-07-15 DIAGNOSIS — H52223 Regular astigmatism, bilateral: Secondary | ICD-10-CM | POA: Diagnosis not present

## 2017-07-15 DIAGNOSIS — Z961 Presence of intraocular lens: Secondary | ICD-10-CM | POA: Diagnosis not present

## 2017-07-15 NOTE — Telephone Encounter (Signed)
Called # listed - this is for son. He states I should call patient, as he does not know much about what is going on with his symptoms.   Patient's # is [id number]

## 2017-07-15 NOTE — Telephone Encounter (Signed)
If recurrent chest pain continues, arrange for follow-up office visit

## 2017-07-15 NOTE — Telephone Encounter (Signed)
Returned call to patient. He reports chest pain for about 1.5 weeks - 4-5x episodes. He feels fine now. He has a coronary history. He has not used PRN meds (NTG) for chest pain. He notices chest pain when he first wakes up and starts doing things. He had an episode this AM, drank coffee, ate cereal and symptoms went away. He has SOB associated with episodes. He describes pain on top left side of chest, unable to qualify symptoms, but resolve when he relaxes after 30 minutes. Pain is non-radiating. He does not routinely monitor VS at home.  Advised that he seek ED eval for CP that worsens in severity, frequency duration >30 minutes.   Routed to MD to review and advise on symptoms

## 2017-07-15 NOTE — Telephone Encounter (Signed)
New message   Patient states he has chest pain off and for last 2 weeks. Pain in chest , top , left. No shortness off breath at this time. States he feels fine right now. Please call   Pt c/o of Chest Pain: STAT if CP now or developed within 24 hours  1. Are you having CP right now? NO  2. Are you experiencing any other symptoms (ex. SOB, nausea, vomiting, sweating)? NO  3. How long have you been experiencing CP? 2 weeks  4. Is your CP continuous or coming and going? Coming and going  5. Have you taken Nitroglycerin? NO ?

## 2017-07-16 NOTE — Telephone Encounter (Signed)
Patient called with MD recommendations. He voiced understanding. He reports he feels well today

## 2017-08-18 ENCOUNTER — Other Ambulatory Visit: Payer: Self-pay

## 2017-08-18 ENCOUNTER — Inpatient Hospital Stay (HOSPITAL_COMMUNITY): Payer: Medicare Other | Admitting: Certified Registered"

## 2017-08-18 ENCOUNTER — Encounter (HOSPITAL_COMMUNITY)
Admission: EM | Disposition: A | Discharge status: Hospice | Payer: Self-pay | Source: Home / Self Care | Attending: Neurology

## 2017-08-18 ENCOUNTER — Emergency Department (HOSPITAL_COMMUNITY): Payer: Medicare Other

## 2017-08-18 ENCOUNTER — Inpatient Hospital Stay (HOSPITAL_COMMUNITY): Payer: Medicare Other

## 2017-08-18 ENCOUNTER — Inpatient Hospital Stay (HOSPITAL_COMMUNITY)
Admission: EM | Admit: 2017-08-18 | Discharge: 2017-08-30 | DRG: 061 | Disposition: A | Discharge status: Hospice | Payer: Medicare Other | Attending: Neurology | Admitting: Neurology

## 2017-08-18 DIAGNOSIS — Z931 Gastrostomy status: Secondary | ICD-10-CM | POA: Diagnosis not present

## 2017-08-18 DIAGNOSIS — R4182 Altered mental status, unspecified: Secondary | ICD-10-CM | POA: Diagnosis not present

## 2017-08-18 DIAGNOSIS — I10 Essential (primary) hypertension: Secondary | ICD-10-CM | POA: Diagnosis present

## 2017-08-18 DIAGNOSIS — R0600 Dyspnea, unspecified: Secondary | ICD-10-CM | POA: Diagnosis not present

## 2017-08-18 DIAGNOSIS — I69391 Dysphagia following cerebral infarction: Secondary | ICD-10-CM | POA: Diagnosis not present

## 2017-08-18 DIAGNOSIS — D509 Iron deficiency anemia, unspecified: Secondary | ICD-10-CM | POA: Diagnosis present

## 2017-08-18 DIAGNOSIS — G8191 Hemiplegia, unspecified affecting right dominant side: Secondary | ICD-10-CM | POA: Diagnosis not present

## 2017-08-18 DIAGNOSIS — I110 Hypertensive heart disease with heart failure: Secondary | ICD-10-CM | POA: Diagnosis present

## 2017-08-18 DIAGNOSIS — J9690 Respiratory failure, unspecified, unspecified whether with hypoxia or hypercapnia: Secondary | ICD-10-CM | POA: Diagnosis not present

## 2017-08-18 DIAGNOSIS — R4701 Aphasia: Secondary | ICD-10-CM | POA: Diagnosis not present

## 2017-08-18 DIAGNOSIS — I6523 Occlusion and stenosis of bilateral carotid arteries: Secondary | ICD-10-CM | POA: Diagnosis present

## 2017-08-18 DIAGNOSIS — Z87891 Personal history of nicotine dependence: Secondary | ICD-10-CM

## 2017-08-18 DIAGNOSIS — I25810 Atherosclerosis of coronary artery bypass graft(s) without angina pectoris: Secondary | ICD-10-CM | POA: Diagnosis present

## 2017-08-18 DIAGNOSIS — Z66 Do not resuscitate: Secondary | ICD-10-CM | POA: Diagnosis present

## 2017-08-18 DIAGNOSIS — I63412 Cerebral infarction due to embolism of left middle cerebral artery: Principal | ICD-10-CM | POA: Diagnosis present

## 2017-08-18 DIAGNOSIS — Z951 Presence of aortocoronary bypass graft: Secondary | ICD-10-CM

## 2017-08-18 DIAGNOSIS — I6340 Cerebral infarction due to embolism of unspecified cerebral artery: Secondary | ICD-10-CM | POA: Diagnosis not present

## 2017-08-18 DIAGNOSIS — R000 Tachycardia, unspecified: Secondary | ICD-10-CM | POA: Diagnosis not present

## 2017-08-18 DIAGNOSIS — Z4659 Encounter for fitting and adjustment of other gastrointestinal appliance and device: Secondary | ICD-10-CM | POA: Diagnosis not present

## 2017-08-18 DIAGNOSIS — R0602 Shortness of breath: Secondary | ICD-10-CM | POA: Diagnosis not present

## 2017-08-18 DIAGNOSIS — Z8546 Personal history of malignant neoplasm of prostate: Secondary | ICD-10-CM | POA: Diagnosis not present

## 2017-08-18 DIAGNOSIS — E876 Hypokalemia: Secondary | ICD-10-CM | POA: Diagnosis present

## 2017-08-18 DIAGNOSIS — R471 Dysarthria and anarthria: Secondary | ICD-10-CM | POA: Diagnosis present

## 2017-08-18 DIAGNOSIS — I2583 Coronary atherosclerosis due to lipid rich plaque: Secondary | ICD-10-CM | POA: Diagnosis not present

## 2017-08-18 DIAGNOSIS — R1310 Dysphagia, unspecified: Secondary | ICD-10-CM | POA: Diagnosis present

## 2017-08-18 DIAGNOSIS — D649 Anemia, unspecified: Secondary | ICD-10-CM | POA: Diagnosis not present

## 2017-08-18 DIAGNOSIS — R479 Unspecified speech disturbances: Secondary | ICD-10-CM | POA: Diagnosis not present

## 2017-08-18 DIAGNOSIS — I2510 Atherosclerotic heart disease of native coronary artery without angina pectoris: Secondary | ICD-10-CM | POA: Diagnosis present

## 2017-08-18 DIAGNOSIS — K567 Ileus, unspecified: Secondary | ICD-10-CM | POA: Diagnosis present

## 2017-08-18 DIAGNOSIS — G464 Cerebellar stroke syndrome: Secondary | ICD-10-CM | POA: Diagnosis not present

## 2017-08-18 DIAGNOSIS — I5031 Acute diastolic (congestive) heart failure: Secondary | ICD-10-CM | POA: Diagnosis not present

## 2017-08-18 DIAGNOSIS — I69351 Hemiplegia and hemiparesis following cerebral infarction affecting right dominant side: Secondary | ICD-10-CM | POA: Diagnosis not present

## 2017-08-18 DIAGNOSIS — Z7189 Other specified counseling: Secondary | ICD-10-CM | POA: Diagnosis not present

## 2017-08-18 DIAGNOSIS — J9601 Acute respiratory failure with hypoxia: Secondary | ICD-10-CM | POA: Diagnosis not present

## 2017-08-18 DIAGNOSIS — I480 Paroxysmal atrial fibrillation: Secondary | ICD-10-CM | POA: Diagnosis not present

## 2017-08-18 DIAGNOSIS — I6602 Occlusion and stenosis of left middle cerebral artery: Secondary | ICD-10-CM | POA: Diagnosis not present

## 2017-08-18 DIAGNOSIS — Z515 Encounter for palliative care: Secondary | ICD-10-CM | POA: Diagnosis present

## 2017-08-18 DIAGNOSIS — R739 Hyperglycemia, unspecified: Secondary | ICD-10-CM | POA: Diagnosis present

## 2017-08-18 DIAGNOSIS — I6789 Other cerebrovascular disease: Secondary | ICD-10-CM | POA: Diagnosis not present

## 2017-08-18 DIAGNOSIS — I69320 Aphasia following cerebral infarction: Secondary | ICD-10-CM | POA: Diagnosis not present

## 2017-08-18 DIAGNOSIS — I4891 Unspecified atrial fibrillation: Secondary | ICD-10-CM | POA: Diagnosis not present

## 2017-08-18 DIAGNOSIS — R29727 NIHSS score 27: Secondary | ICD-10-CM | POA: Diagnosis present

## 2017-08-18 DIAGNOSIS — E871 Hypo-osmolality and hyponatremia: Secondary | ICD-10-CM | POA: Diagnosis not present

## 2017-08-18 DIAGNOSIS — E785 Hyperlipidemia, unspecified: Secondary | ICD-10-CM | POA: Diagnosis present

## 2017-08-18 DIAGNOSIS — Z79899 Other long term (current) drug therapy: Secondary | ICD-10-CM

## 2017-08-18 DIAGNOSIS — Z955 Presence of coronary angioplasty implant and graft: Secondary | ICD-10-CM

## 2017-08-18 DIAGNOSIS — Z978 Presence of other specified devices: Secondary | ICD-10-CM

## 2017-08-18 DIAGNOSIS — I639 Cerebral infarction, unspecified: Secondary | ICD-10-CM

## 2017-08-18 DIAGNOSIS — I672 Cerebral atherosclerosis: Secondary | ICD-10-CM | POA: Diagnosis present

## 2017-08-18 DIAGNOSIS — Z9289 Personal history of other medical treatment: Secondary | ICD-10-CM

## 2017-08-18 DIAGNOSIS — Z7902 Long term (current) use of antithrombotics/antiplatelets: Secondary | ICD-10-CM

## 2017-08-18 DIAGNOSIS — Z9189 Other specified personal risk factors, not elsewhere classified: Secondary | ICD-10-CM

## 2017-08-18 DIAGNOSIS — I6300 Cerebral infarction due to thrombosis of unspecified precerebral artery: Secondary | ICD-10-CM | POA: Diagnosis not present

## 2017-08-18 DIAGNOSIS — G460 Middle cerebral artery syndrome: Secondary | ICD-10-CM | POA: Diagnosis present

## 2017-08-18 DIAGNOSIS — J690 Pneumonitis due to inhalation of food and vomit: Secondary | ICD-10-CM | POA: Diagnosis not present

## 2017-08-18 DIAGNOSIS — J9600 Acute respiratory failure, unspecified whether with hypoxia or hypercapnia: Secondary | ICD-10-CM | POA: Diagnosis not present

## 2017-08-18 DIAGNOSIS — I252 Old myocardial infarction: Secondary | ICD-10-CM

## 2017-08-18 DIAGNOSIS — R1110 Vomiting, unspecified: Secondary | ICD-10-CM

## 2017-08-18 DIAGNOSIS — Z4682 Encounter for fitting and adjustment of non-vascular catheter: Secondary | ICD-10-CM | POA: Diagnosis not present

## 2017-08-18 DIAGNOSIS — Z436 Encounter for attention to other artificial openings of urinary tract: Secondary | ICD-10-CM | POA: Diagnosis not present

## 2017-08-18 DIAGNOSIS — I959 Hypotension, unspecified: Secondary | ICD-10-CM | POA: Diagnosis present

## 2017-08-18 DIAGNOSIS — R918 Other nonspecific abnormal finding of lung field: Secondary | ICD-10-CM | POA: Diagnosis not present

## 2017-08-18 DIAGNOSIS — R29818 Other symptoms and signs involving the nervous system: Secondary | ICD-10-CM | POA: Diagnosis not present

## 2017-08-18 DIAGNOSIS — R609 Edema, unspecified: Secondary | ICD-10-CM | POA: Diagnosis not present

## 2017-08-18 DIAGNOSIS — I361 Nonrheumatic tricuspid (valve) insufficiency: Secondary | ICD-10-CM | POA: Diagnosis not present

## 2017-08-18 DIAGNOSIS — Z01818 Encounter for other preprocedural examination: Secondary | ICD-10-CM

## 2017-08-18 DIAGNOSIS — K117 Disturbances of salivary secretion: Secondary | ICD-10-CM | POA: Diagnosis not present

## 2017-08-18 DIAGNOSIS — L8990 Pressure ulcer of unspecified site, unspecified stage: Secondary | ICD-10-CM

## 2017-08-18 DIAGNOSIS — I63512 Cerebral infarction due to unspecified occlusion or stenosis of left middle cerebral artery: Secondary | ICD-10-CM | POA: Diagnosis not present

## 2017-08-18 DIAGNOSIS — R29810 Facial weakness: Secondary | ICD-10-CM | POA: Diagnosis not present

## 2017-08-18 HISTORY — PX: RADIOLOGY WITH ANESTHESIA: SHX6223

## 2017-08-18 LAB — COMPREHENSIVE METABOLIC PANEL
ALBUMIN: 4 g/dL (ref 3.5–5.0)
ALT: 22 U/L (ref 17–63)
AST: 23 U/L (ref 15–41)
Alkaline Phosphatase: 65 U/L (ref 38–126)
Anion gap: 6 (ref 5–15)
BUN: 28 mg/dL — ABNORMAL HIGH (ref 6–20)
CHLORIDE: 103 mmol/L (ref 101–111)
CO2: 26 mmol/L (ref 22–32)
Calcium: 8.9 mg/dL (ref 8.9–10.3)
Creatinine, Ser: 0.76 mg/dL (ref 0.61–1.24)
GFR calc Af Amer: >60 mL/min (ref >60)
GFR calc non Af Amer: >60 mL/min (ref >60)
GLUCOSE: 147 mg/dL — AB (ref 65–99)
Potassium: 3.8 mmol/L (ref 3.5–5.1)
Sodium: 135 mmol/L (ref 135–145)
Total Bilirubin: 0.8 mg/dL (ref 0.3–1.2)
Total Protein: 6.7 g/dL (ref 6.5–8.1)

## 2017-08-18 LAB — APTT: APTT: 30 s (ref 24–36)

## 2017-08-18 LAB — DIFFERENTIAL
BASOS ABS: 0 10*3/uL (ref 0.0–0.1)
BASOS PCT: 0 %
EOS ABS: 0.3 10*3/uL (ref 0.0–0.7)
Eosinophils Relative: 3 %
LYMPHS ABS: 1.8 10*3/uL (ref 0.7–4.0)
Lymphocytes Relative: 17 %
Monocytes Absolute: 0.8 10*3/uL (ref 0.1–1.0)
Monocytes Relative: 7 %
NEUTROS PCT: 73 %
Neutro Abs: 7.7 10*3/uL (ref 1.7–7.7)

## 2017-08-18 LAB — I-STAT CHEM 8, ED
BUN: 27 mg/dL — ABNORMAL HIGH (ref 6–20)
CALCIUM ION: 1.16 mmol/L (ref 1.15–1.40)
CHLORIDE: 102 mmol/L (ref 101–111)
Creatinine, Ser: 0.8 mg/dL (ref 0.61–1.24)
Glucose, Bld: 142 mg/dL — ABNORMAL HIGH (ref 65–99)
HCT: 35 % — ABNORMAL LOW (ref 39.0–52.0)
HEMOGLOBIN: 11.9 g/dL — AB (ref 13.0–17.0)
Potassium: 3.9 mmol/L (ref 3.5–5.1)
Sodium: 139 mmol/L (ref 135–145)
TCO2: 24 mmol/L (ref 22–32)

## 2017-08-18 LAB — CBC
HCT: 35.9 % — ABNORMAL LOW (ref 39.0–52.0)
HEMOGLOBIN: 11.5 g/dL — AB (ref 13.0–17.0)
MCH: 29.6 pg (ref 26.0–34.0)
MCHC: 32 g/dL (ref 30.0–36.0)
MCV: 92.3 fL (ref 78.0–100.0)
Platelets: 209 10*3/uL (ref 150–400)
RBC: 3.89 MIL/uL — ABNORMAL LOW (ref 4.22–5.81)
RDW: 13.7 % (ref 11.5–15.5)
WBC: 10.5 10*3/uL (ref 4.0–10.5)

## 2017-08-18 LAB — CBG MONITORING, ED: Glucose-Capillary: 143 mg/dL — ABNORMAL HIGH (ref 65–99)

## 2017-08-18 LAB — I-STAT TROPONIN, ED: Troponin i, poc: 0.01 ng/mL (ref 0.00–0.08)

## 2017-08-18 LAB — PROTIME-INR
INR: 1.04
Prothrombin Time: 13.5 seconds (ref 11.4–15.2)

## 2017-08-18 SURGERY — RADIOLOGY WITH ANESTHESIA
Anesthesia: General

## 2017-08-18 MED ORDER — PROPOFOL 10 MG/ML IV BOLUS
INTRAVENOUS | Status: DC | PRN
  Administered 2017-08-18: 100 mg via INTRAVENOUS

## 2017-08-18 MED ORDER — NICARDIPINE HCL IN NACL 20-0.86 MG/200ML-% IV SOLN
INTRAVENOUS | Status: AC
  Filled 2017-08-18: qty 200

## 2017-08-18 MED ORDER — LACTATED RINGERS IV SOLN
INTRAVENOUS | Status: DC | PRN
  Administered 2017-08-18: 23:00:00 via INTRAVENOUS

## 2017-08-18 MED ORDER — ACETAMINOPHEN 160 MG/5ML PO SOLN: 650.0000 mg | ORAL | Status: DC | PRN

## 2017-08-18 MED ORDER — ACETAMINOPHEN 650 MG RE SUPP: 650.0000 mg | RECTAL | Status: DC | PRN

## 2017-08-18 MED ORDER — PHENYLEPHRINE 40 MCG/ML (10ML) SYRINGE FOR IV PUSH (FOR BLOOD PRESSURE SUPPORT)
PREFILLED_SYRINGE | INTRAVENOUS | Status: DC | PRN
  Administered 2017-08-18: 200 ug via INTRAVENOUS
  Administered 2017-08-18: 80 ug via INTRAVENOUS
  Administered 2017-08-18: 120 ug via INTRAVENOUS

## 2017-08-18 MED ORDER — LIDOCAINE HCL 1 % IJ SOLN
INTRAMUSCULAR | Status: AC
  Filled 2017-08-18: qty 20

## 2017-08-18 MED ORDER — CEFAZOLIN SODIUM-DEXTROSE 2-4 GM/100ML-% IV SOLN
INTRAVENOUS | Status: AC
  Filled 2017-08-18: qty 100

## 2017-08-18 MED ORDER — NITROGLYCERIN 1 MG/10 ML FOR IR/CATH LAB
INTRA_ARTERIAL | Status: AC
  Filled 2017-08-18: qty 10

## 2017-08-18 MED ORDER — IOPAMIDOL (ISOVUE-300) INJECTION 61%
INTRAVENOUS | Status: AC
  Administered 2017-08-19: 140 mL
  Filled 2017-08-18: qty 300

## 2017-08-18 MED ORDER — LIDOCAINE 2% (20 MG/ML) 5 ML SYRINGE
INTRAMUSCULAR | Status: DC | PRN
  Administered 2017-08-18: 100 mg via INTRAVENOUS

## 2017-08-18 MED ORDER — PHENYLEPHRINE HCL 10 MG/ML IJ SOLN
INTRAVENOUS | Status: DC | PRN
  Administered 2017-08-18: 50 ug/min via INTRAVENOUS

## 2017-08-18 MED ORDER — LABETALOL HCL 5 MG/ML IV SOLN
INTRAVENOUS | Status: AC
  Administered 2017-08-18: 20 mg
  Filled 2017-08-18: qty 4

## 2017-08-18 MED ORDER — CEFAZOLIN SODIUM-DEXTROSE 2-3 GM-%(50ML) IV SOLR
INTRAVENOUS | Status: DC | PRN
  Administered 2017-08-18: 2 g via INTRAVENOUS

## 2017-08-18 MED ORDER — NICARDIPINE HCL IN NACL 20-0.86 MG/200ML-% IV SOLN: 0.0000 mg/h | INTRAVENOUS | Status: DC

## 2017-08-18 MED ORDER — FENTANYL CITRATE (PF) 100 MCG/2ML IJ SOLN
INTRAMUSCULAR | Status: AC
  Filled 2017-08-18: qty 8

## 2017-08-18 MED ORDER — ACETAMINOPHEN 325 MG PO TABS: 650.0000 mg | ORAL_TABLET | ORAL | Status: DC | PRN

## 2017-08-18 MED ORDER — ROCURONIUM 10MG/ML (10ML) SYRINGE FOR MEDFUSION PUMP - OPTIME
INTRAVENOUS | Status: DC | PRN
  Administered 2017-08-18: 100 mg via INTRAVENOUS

## 2017-08-18 MED ORDER — LABETALOL HCL 5 MG/ML IV SOLN
20.0000 mg | Freq: Once | INTRAVENOUS | Status: AC
  Administered 2017-08-18: 10 mg via INTRAVENOUS

## 2017-08-18 MED ORDER — ALTEPLASE (STROKE) FULL DOSE INFUSION
0.9000 mg/kg | Freq: Once | INTRAVENOUS | Status: AC
  Administered 2017-08-18: 75 mg via INTRAVENOUS
  Filled 2017-08-18: qty 100

## 2017-08-18 MED ORDER — SUCCINYLCHOLINE CHLORIDE 200 MG/10ML IV SOSY
PREFILLED_SYRINGE | INTRAVENOUS | Status: DC | PRN
  Administered 2017-08-18: 140 mg via INTRAVENOUS

## 2017-08-18 MED ORDER — ONDANSETRON HCL 4 MG/2ML IJ SOLN
4.0000 mg | Freq: Once | INTRAMUSCULAR | Status: AC
  Administered 2017-08-18: 4 mg via INTRAVENOUS
  Filled 2017-08-18: qty 2

## 2017-08-18 MED ORDER — STROKE: EARLY STAGES OF RECOVERY BOOK
Freq: Once | Status: AC
  Filled 2017-08-18: qty 1

## 2017-08-18 MED ORDER — SODIUM CHLORIDE 0.9 % IV SOLN
INTRAVENOUS | Status: DC
  Administered 2017-08-22 – 2017-08-25 (×4): via INTRAVENOUS

## 2017-08-18 MED ORDER — FENTANYL CITRATE (PF) 100 MCG/2ML IJ SOLN
INTRAMUSCULAR | Status: DC | PRN
  Administered 2017-08-18: 200 ug via INTRAVENOUS

## 2017-08-18 MED ORDER — SENNOSIDES-DOCUSATE SODIUM 8.6-50 MG PO TABS: 1.0000 | ORAL_TABLET | Freq: Every evening | ORAL | Status: DC | PRN

## 2017-08-18 MED ORDER — EPTIFIBATIDE 20 MG/10ML IV SOLN
INTRAVENOUS | Status: AC
  Filled 2017-08-18: qty 10

## 2017-08-18 MED ORDER — IOPAMIDOL (ISOVUE-370) INJECTION 76%
100.0000 mL | Freq: Once | INTRAVENOUS | Status: AC | PRN
  Administered 2017-08-18: 100 mL via INTRAVENOUS

## 2017-08-18 NOTE — ED Provider Notes (Addendum)
[facility] EMERGENCY DEPARTMENT Provider Note   CSN: [id number] Arrival date & time: 08/18/17  2150  LEVEL 5 CAVEAT - ACUITY OF SITUATION/APHASIA   History   Chief Complaint Chief Complaint  Patient presents with  . Code Stroke    HPI Thomas Hanna is a 84 y.o. male.  HPI  84 year old male brought in as a code stroke.  Last known normal was 7 PM.  Report from EMS states that patient is functional at home.  He is been having right arm and leg weakness and left-sided gaze.  The patient is a phasic and unable to contribute to the history.  Past Medical History:  Diagnosis Date  . Coronary artery disease   . MI (myocardial infarction) ([place])   . Orthostatic hypotension 08/2016  . Prostate cancer ([place])   . Stroke [facility])     Patient Active Problem List   Diagnosis Date Noted  . Stroke (cerebrum) ([place]) 08/18/2017  . Cervical paraspinal muscle spasm 02/18/2017  . MCI (mild cognitive impairment) 10/22/2016  . Orthostatic hypertension 09/13/2016  . Coronary artery disease involving coronary bypass graft of native heart without angina pectoris 09/13/2016  . Abnormal MRI of head 09/13/2016  . Orthostatic hypotension 09/03/2016  . CAD S/P percutaneous coronary angioplasty 09/03/2016  . Hx of CABG 09/03/2016  . Dyslipidemia 09/03/2016  . Coronary artery disease due to lipid rich plaque 08/29/2016  . Hyponatremia 08/29/2016  . Elevated LFTs 08/29/2016  . Normocytic anemia 08/29/2016  . Syncope 08/28/2016    Past Surgical History:  Procedure Laterality Date  . cardiac stents    . CARDIAC SURGERY    . CORONARY ARTERY BYPASS GRAFT         Home Medications    Prior to Admission medications   Medication Sig Start Date End Date Taking? Authorizing Provider  cholecalciferol (VITAMIN D) 1000 units tablet Take 1,000 Units by mouth daily.    [provider]  clopidogrel (PLAVIX) 75 MG tablet Take 75 mg by mouth daily.    [provider]    dorzolamide (TRUSOPT) 2 % ophthalmic solution Place 1 drop into both eyes 2 (two) times daily.    [provider]  FERROUS SULFATE PO Take by mouth.    [provider]  gabapentin (NEURONTIN) 100 MG capsule Take 600 mg by mouth 3 (three) times daily. Pt takes 600 mg in the morning and 300 mg at bedtime    [provider]  latanoprost (XALATAN) 0.005 % ophthalmic solution Place 1 drop into both eyes at bedtime.     [provider]  midodrine (PROAMATINE) 5 MG tablet Take 0.5 tablets (2.5 mg total) by mouth 2 (two) times daily with a meal. 05/15/17   Troy Sine, MD  tamsulosin (FLOMAX) 0.4 MG CAPS capsule Take 0.4 mg by mouth.    [provider]  vitamin B-12 (CYANOCOBALAMIN) 100 MCG tablet Take 100 mcg by mouth daily.    [provider]    Family History Family History  Problem Relation Age of Onset  . Diabetes Mother   . Uterine cancer Sister     Social History Social History   Tobacco Use  . Smoking status: Former Smoker    Last attempt to quit: 1970    Years since quitting: 49.0  . Smokeless tobacco: Never Used  Substance Use Topics  . Alcohol use: No  . Drug use: No     Allergies   Patient has no known allergies.   Review  of Systems Review of Systems  Unable to perform ROS: Mental status change     Physical Exam Updated Vital Signs BP (!) 168/111 (BP Location: Right Arm)   Pulse (!) 122   Temp 98.7 F (37.1 C) (Oral)   Resp 20   Wt 82.9 kg (182 lb 12.2 oz)   SpO2 98%   BMI 25.13 kg/m   Physical Exam  Constitutional: He appears well-developed and well-nourished.  HENT:  Head: Normocephalic and atraumatic.  Right Ear: External ear normal.  Left Ear: External ear normal.  Nose: Nose normal.  Eyes: Right eye exhibits no discharge. Left eye exhibits no discharge.  Neck: Neck supple.  Cardiovascular: Normal rate, regular rhythm and normal heart sounds.  Pulmonary/Chest: Effort normal and breath  sounds normal.  Abdominal: Soft. There is no tenderness.  Musculoskeletal: He exhibits no edema.  Neurological: He is alert.  Patient is awake, alert but does not follow commands very well. Left sided gaze. Right arm/leg weakness compared to left. Moves to painful stimuli in RLE.  Skin: Skin is warm and dry.  Nursing note and vitals reviewed.    ED Treatments / Results  Labs (all labs ordered are listed, but only abnormal results are displayed) Labs Reviewed  CBC - Abnormal; Notable for the following components:      Result Value   RBC 3.89 (*)    Hemoglobin 11.5 (*)    HCT 35.9 (*)    All other components within normal limits  COMPREHENSIVE METABOLIC PANEL - Abnormal; Notable for the following components:   Glucose, Bld 147 (*)    BUN 28 (*)    All other components within normal limits  CBG MONITORING, ED - Abnormal; Notable for the following components:   Glucose-Capillary 143 (*)    All other components within normal limits  I-STAT CHEM 8, ED - Abnormal; Notable for the following components:   BUN 27 (*)    Glucose, Bld 142 (*)    Hemoglobin 11.9 (*)    HCT 35.0 (*)    All other components within normal limits  PROTIME-INR  APTT  DIFFERENTIAL  HEMOGLOBIN A1C  LIPID PANEL  I-STAT TROPONIN, ED    EKG  EKG Interpretation  Date/Time:  Monday August 18 2017 22:41:25 EST Ventricular Rate:  107 PR Interval:    QRS Duration: 95 QT Interval:  333 QTC Calculation: 445 R Axis:   79 Text Interpretation:  Atrial fibrillation Ventricular premature complex Low voltage, precordial leads Borderline repolarization abnormality Baseline wander in lead(s) III V2 afib new since jan 2018 Confirmed by Sherwood Gambler [phone]) on 08/18/2017 11:52:06 PM       Radiology Ct Angio Head W Or Wo Contrast  Result Date: 08/18/2017 CLINICAL DATA:  84 y/o M; right-sided weakness and speech difficulty. EXAM: CT ANGIOGRAPHY HEAD AND NECK CT PERFUSION BRAIN TECHNIQUE: Multidetector CT imaging of  the head and neck was performed using the standard protocol during bolus administration of intravenous contrast. Multiplanar CT image reconstructions and MIPs were obtained to evaluate the vascular anatomy. Carotid stenosis measurements (when applicable) are obtained utilizing NASCET criteria, using the distal internal carotid diameter as the denominator. Multiphase CT imaging of the brain was performed following IV bolus contrast injection. Subsequent parametric perfusion maps were calculated using RAPID software. CONTRAST:  126mL ISOVUE-370 IOPAMIDOL (ISOVUE-370) INJECTION 76% COMPARISON:  09/04/2016 CTA head. FINDINGS: CTA NECK FINDINGS Aortic arch: Bovine variant branching. Imaged portion shows no evidence of aneurysm or dissection. No significant stenosis of the major arch  vessel origins. Moderate calcific atherosclerosis. Right carotid system: Dense calcified plaque of right carotid bifurcation with 60-70% proximal ICA stenosis. Left carotid system: Dense calcified plaque of left carotid bifurcation with 60-70% proximal ICA stenosis. \ Vertebral arteries: Calcified plaque of right vertebral artery origin with moderate 50-70% stenosis. Patent right vertebral artery. Skeleton: Mild cervical spondylosis. No high-grade bony canal stenosis per Other neck: Negative. Upper chest: Right greater than left apical pleuroparenchymal scarring. Review of the MIP images confirms the above findings CTA HEAD FINDINGS Anterior circulation: Right paraclinoid and terminal ICA occlusion/near occlusion. Contrast opacification of the right upper cervical ICA diminishes gradually to the terminal segment of ICA suggesting a degree of patency, severe stenosis was present on the prior study, this may represent chronic severe stenosis with poor antegrade flow or interval thrombosis. Severe left paraclinoid ICA stenosis. Distal left M1 occlusion extending into left M2 superior division. Early reconstitution of left M2 inferior division  via collateralization. Poor downstream collateralization in the superior left MCA distribution. Posterior circulation: No significant stenosis, proximal occlusion, aneurysm, or vascular malformation. Venous sinuses: As permitted by contrast timing, patent. Anatomic variants: Fetal right PCA. Patent anterior communicating artery. No left posterior communicating artery identified, likely hypoplastic or absent. Review of the MIP images confirms the above findings CT Brain Perfusion Findings: CBF (<30%) Volume: 63mL Perfusion (Tmax>6.0s) volume: 131mL Mismatch Volume: 129mL Infarction Location:Left superior MCA IMPRESSION: CTA neck: 1. Patent carotid and vertebral arteries of the neck. 2. Dense calcified plaque of bilateral carotid bifurcations with proximal ICA moderate 60-70% stenosis. 3. Moderate stenosis of right vertebral artery origin. CTA head: 1. Distal left M1 occlusion extending into left M2 superior division. Early reconstitution of left M2 inferior division via collateralization. Poor downstream collateralization and left superior MCA distribution. 2. Left paraclinoid ICA severe stenosis. 3. Right paraclinoid and terminal ICA occlusion or near occlusion. Contrast opacification of the right upper cervical ICA diminishes gradually to the terminal segment, severe stenosis was present on the prior study. Findings may represent chronic severe stenosis with poor antegrade flow or interval thrombosis. CT brain perfusion: 1. Core infarct volume of 71 cc. 2. Mismatch ischemic penumbra of 102 cc. 3. Infarct location left superior MCA distribution. These results were called by telephone at the time of interpretation on 08/18/2017 at 10:38 pm to Dr. Amie Portland , who verbally acknowledged these results. Electronically Signed   By: Kristine Garbe M.D.   On: 08/18/2017 22:54   Ct Angio Neck W Or Wo Contrast  Result Date: 08/18/2017 CLINICAL DATA:  84 y/o M; right-sided weakness and speech difficulty. EXAM: CT  ANGIOGRAPHY HEAD AND NECK CT PERFUSION BRAIN TECHNIQUE: Multidetector CT imaging of the head and neck was performed using the standard protocol during bolus administration of intravenous contrast. Multiplanar CT image reconstructions and MIPs were obtained to evaluate the vascular anatomy. Carotid stenosis measurements (when applicable) are obtained utilizing NASCET criteria, using the distal internal carotid diameter as the denominator. Multiphase CT imaging of the brain was performed following IV bolus contrast injection. Subsequent parametric perfusion maps were calculated using RAPID software. CONTRAST:  14mL ISOVUE-370 IOPAMIDOL (ISOVUE-370) INJECTION 76% COMPARISON:  09/04/2016 CTA head. FINDINGS: CTA NECK FINDINGS Aortic arch: Bovine variant branching. Imaged portion shows no evidence of aneurysm or dissection. No significant stenosis of the major arch vessel origins. Moderate calcific atherosclerosis. Right carotid system: Dense calcified plaque of right carotid bifurcation with 60-70% proximal ICA stenosis. Left carotid system: Dense calcified plaque of left carotid bifurcation with 60-70% proximal ICA stenosis. \  Vertebral arteries: Calcified plaque of right vertebral artery origin with moderate 50-70% stenosis. Patent right vertebral artery. Skeleton: Mild cervical spondylosis. No high-grade bony canal stenosis per Other neck: Negative. Upper chest: Right greater than left apical pleuroparenchymal scarring. Review of the MIP images confirms the above findings CTA HEAD FINDINGS Anterior circulation: Right paraclinoid and terminal ICA occlusion/near occlusion. Contrast opacification of the right upper cervical ICA diminishes gradually to the terminal segment of ICA suggesting a degree of patency, severe stenosis was present on the prior study, this may represent chronic severe stenosis with poor antegrade flow or interval thrombosis. Severe left paraclinoid ICA stenosis. Distal left M1 occlusion extending  into left M2 superior division. Early reconstitution of left M2 inferior division via collateralization. Poor downstream collateralization in the superior left MCA distribution. Posterior circulation: No significant stenosis, proximal occlusion, aneurysm, or vascular malformation. Venous sinuses: As permitted by contrast timing, patent. Anatomic variants: Fetal right PCA. Patent anterior communicating artery. No left posterior communicating artery identified, likely hypoplastic or absent. Review of the MIP images confirms the above findings CT Brain Perfusion Findings: CBF (<30%) Volume: 80mL Perfusion (Tmax>6.0s) volume: 117mL Mismatch Volume: 166mL Infarction Location:Left superior MCA IMPRESSION: CTA neck: 1. Patent carotid and vertebral arteries of the neck. 2. Dense calcified plaque of bilateral carotid bifurcations with proximal ICA moderate 60-70% stenosis. 3. Moderate stenosis of right vertebral artery origin. CTA head: 1. Distal left M1 occlusion extending into left M2 superior division. Early reconstitution of left M2 inferior division via collateralization. Poor downstream collateralization and left superior MCA distribution. 2. Left paraclinoid ICA severe stenosis. 3. Right paraclinoid and terminal ICA occlusion or near occlusion. Contrast opacification of the right upper cervical ICA diminishes gradually to the terminal segment, severe stenosis was present on the prior study. Findings may represent chronic severe stenosis with poor antegrade flow or interval thrombosis. CT brain perfusion: 1. Core infarct volume of 71 cc. 2. Mismatch ischemic penumbra of 102 cc. 3. Infarct location left superior MCA distribution. These results were called by telephone at the time of interpretation on 08/18/2017 at 10:38 pm to Dr. Amie Portland , who verbally acknowledged these results. Electronically Signed   By: Kristine Garbe M.D.   On: 08/18/2017 22:54   Ct Cerebral Perfusion W Contrast  Result Date:  08/18/2017 CLINICAL DATA:  84 y/o M; right-sided weakness and speech difficulty. EXAM: CT ANGIOGRAPHY HEAD AND NECK CT PERFUSION BRAIN TECHNIQUE: Multidetector CT imaging of the head and neck was performed using the standard protocol during bolus administration of intravenous contrast. Multiplanar CT image reconstructions and MIPs were obtained to evaluate the vascular anatomy. Carotid stenosis measurements (when applicable) are obtained utilizing NASCET criteria, using the distal internal carotid diameter as the denominator. Multiphase CT imaging of the brain was performed following IV bolus contrast injection. Subsequent parametric perfusion maps were calculated using RAPID software. CONTRAST:  130mL ISOVUE-370 IOPAMIDOL (ISOVUE-370) INJECTION 76% COMPARISON:  09/04/2016 CTA head. FINDINGS: CTA NECK FINDINGS Aortic arch: Bovine variant branching. Imaged portion shows no evidence of aneurysm or dissection. No significant stenosis of the major arch vessel origins. Moderate calcific atherosclerosis. Right carotid system: Dense calcified plaque of right carotid bifurcation with 60-70% proximal ICA stenosis. Left carotid system: Dense calcified plaque of left carotid bifurcation with 60-70% proximal ICA stenosis. \ Vertebral arteries: Calcified plaque of right vertebral artery origin with moderate 50-70% stenosis. Patent right vertebral artery. Skeleton: Mild cervical spondylosis. No high-grade bony canal stenosis per Other neck: Negative. Upper chest: Right greater than left apical pleuroparenchymal  scarring. Review of the MIP images confirms the above findings CTA HEAD FINDINGS Anterior circulation: Right paraclinoid and terminal ICA occlusion/near occlusion. Contrast opacification of the right upper cervical ICA diminishes gradually to the terminal segment of ICA suggesting a degree of patency, severe stenosis was present on the prior study, this may represent chronic severe stenosis with poor antegrade flow or  interval thrombosis. Severe left paraclinoid ICA stenosis. Distal left M1 occlusion extending into left M2 superior division. Early reconstitution of left M2 inferior division via collateralization. Poor downstream collateralization in the superior left MCA distribution. Posterior circulation: No significant stenosis, proximal occlusion, aneurysm, or vascular malformation. Venous sinuses: As permitted by contrast timing, patent. Anatomic variants: Fetal right PCA. Patent anterior communicating artery. No left posterior communicating artery identified, likely hypoplastic or absent. Review of the MIP images confirms the above findings CT Brain Perfusion Findings: CBF (<30%) Volume: 21mL Perfusion (Tmax>6.0s) volume: 141mL Mismatch Volume: 175mL Infarction Location:Left superior MCA IMPRESSION: CTA neck: 1. Patent carotid and vertebral arteries of the neck. 2. Dense calcified plaque of bilateral carotid bifurcations with proximal ICA moderate 60-70% stenosis. 3. Moderate stenosis of right vertebral artery origin. CTA head: 1. Distal left M1 occlusion extending into left M2 superior division. Early reconstitution of left M2 inferior division via collateralization. Poor downstream collateralization and left superior MCA distribution. 2. Left paraclinoid ICA severe stenosis. 3. Right paraclinoid and terminal ICA occlusion or near occlusion. Contrast opacification of the right upper cervical ICA diminishes gradually to the terminal segment, severe stenosis was present on the prior study. Findings may represent chronic severe stenosis with poor antegrade flow or interval thrombosis. CT brain perfusion: 1. Core infarct volume of 71 cc. 2. Mismatch ischemic penumbra of 102 cc. 3. Infarct location left superior MCA distribution. These results were called by telephone at the time of interpretation on 08/18/2017 at 10:38 pm to Dr. Amie Portland , who verbally acknowledged these results. Electronically Signed   By: Kristine Garbe M.D.   On: 08/18/2017 22:54   Ct Head Code Stroke Wo Contrast  Result Date: 08/18/2017 CLINICAL DATA:  Code stroke. 84 y/o M; right-sided weakness and speech difficulty. EXAM: CT HEAD WITHOUT CONTRAST TECHNIQUE: Contiguous axial images were obtained from the base of the skull through the vertex without intravenous contrast. COMPARISON:  09/04/2016 CT angiogram head.  12/07/2016 MRI head. FINDINGS: Brain: No evidence of acute infarction, hemorrhage, hydrocephalus, extra-axial collection or mass lesion/mass effect. Stable background of chronic microvascular ischemic changes and brain parenchymal volume loss. Small chronic infarct within left thalamus. Vascular: Calcific atherosclerosis of carotid siphons. Density within left M2 superior division (series 6, image 47). Skull: Normal. Negative for fracture or focal lesion. Sinuses/Orbits: No acute finding. Other: Bilateral intra-ocular lens replacement. ASPECTS [facility] Stroke Program Early CT Score) - Ganglionic level infarction (caudate, lentiform nuclei, internal capsule, insula, M1-M3 cortex): 7 - Supraganglionic infarction (M4-M6 cortex): 3 Total score (0-10 with 10 being normal): 10 IMPRESSION: 1. No acute stroke, hemorrhage, or mass effect identified. 2. Density in left M2 superior division, possible thrombus. 3. ASPECTS is 10 4. Stable chronic microvascular ischemic changes and parenchymal volume loss of the brain. These results were called by telephone at the time of interpretation on 08/18/2017 at 10:11 pm to Dr. Carmin Muskrat , who verbally acknowledged these results. Electronically Signed   By: Kristine Garbe M.D.   On: 08/18/2017 22:11    Procedures .Critical Care Performed by: Sherwood Gambler, MD Authorized by: Sherwood Gambler, MD   Critical care provider statement:  Critical care time (minutes):  30   Critical care time was exclusive of:  Separately billable procedures and treating other patients   Critical care  was necessary to treat or prevent imminent or life-threatening deterioration of the following conditions:  CNS failure or compromise   Critical care was time spent personally by me on the following activities:  Development of treatment plan with patient or surrogate, discussions with consultants, evaluation of patient's response to treatment, examination of patient, obtaining history from patient or surrogate, ordering and performing treatments and interventions, ordering and review of laboratory studies, ordering and review of radiographic studies, pulse oximetry, re-evaluation of patient's condition and review of old charts   (including critical care time)  Medications Ordered in ED Medications   stroke: mapping our early stages of recovery book (not administered)  0.9 %  sodium chloride infusion (not administered)  acetaminophen (TYLENOL) tablet 650 mg (not administered)    Or  acetaminophen (TYLENOL) solution 650 mg (not administered)    Or  acetaminophen (TYLENOL) suppository 650 mg (not administered)  senna-docusate (Senokot-S) tablet 1 tablet (not administered)  labetalol (NORMODYNE,TRANDATE) injection 20 mg (10 mg Intravenous Given 08/18/17 2255)    And  nicardipine (CARDENE) 20mg  in 0.86% saline 249ml IV infusion (0.1 mg/ml) (not administered)  niCARdipine in saline (CARDENE-IV) 20-0.86 MG/200ML-% infusion SOLN (not administered)  fentaNYL (SUBLIMAZE) 100 MCG/2ML injection (not administered)  ceFAZolin (ANCEF) 2-4 GM/100ML-% IVPB (not administered)  iopamidol (ISOVUE-300) 61 % injection (not administered)  nitroGLYCERIN 100 MCG/ML intra-arterial injection (not administered)  eptifibatide (INTEGRILIN) 20 MG/10ML injection (not administered)  alteplase (ACTIVASE) 1 mg/mL infusion 75 mg (75 mg Intravenous New Bag/Given 08/18/17 2210)  iopamidol (ISOVUE-370) 76 % injection 100 mL (100 mLs Intravenous Contrast Given 08/18/17 2207)  ondansetron (ZOFRAN) injection 4 mg (4 mg Intravenous Given  08/18/17 2245)  labetalol (NORMODYNE,TRANDATE) 5 MG/ML injection (20 mg  Given 08/18/17 2209)     Initial Impression / Assessment and Plan / ED Course  I have reviewed the triage vital signs and the nursing notes.  Pertinent labs & imaging results that were available during my care of the patient were reviewed by me and considered in my medical decision making (see chart for details).     Patient's airway is intact.  He has left-sided gaze but does not appear to need acute airway intervention.  He is awake and alert.  Patient given TPA by neurology given he is within the window with obvious strokelike symptoms.  CT scan with perfusion shows salvageable territory and he will be taken emergently to IR for intervention.  Admit to ICU afterwards with neurology.  Final Clinical Impressions(s) / ED Diagnoses   Final diagnoses:  Cerebrovascular accident (CVA), unspecified mechanism [facility])    ED Discharge Orders    None       Sherwood Gambler, MD 08/18/17 2563    Sherwood Gambler, MD 09/01/17 0700

## 2017-08-18 NOTE — Anesthesia Procedure Notes (Signed)
Arterial Line Insertion Start/End1/02/2018 11:28 PM, 08/18/2017 11:31 PM Performed by: Colin Benton, CRNA, CRNA  Patient location: OOR procedure area. Emergency situation Left, radial was placed Catheter size: 20 G Hand hygiene performed , maximum sterile barriers used  and Seldinger technique used Allen's test indicative of satisfactory collateral circulation Attempts: 1 Procedure performed without using ultrasound guided technique. Following insertion, dressing applied. Post procedure assessment: normal and unchanged  Patient tolerated the procedure well with no immediate complications.

## 2017-08-18 NOTE — ED Triage Notes (Signed)
Per EMS: Pt from home with family with c/o right sided weakness and aphasia.  EMS noted left sided gaze and right sided neglect.  PTA vitals:  BP 194/110, Sp02 on 3 L.  CBG 141.  Pt also had an episode of incontinence.

## 2017-08-18 NOTE — Anesthesia Preprocedure Evaluation (Signed)
Anesthesia Evaluation  Patient identified by MRN, date of birth, ID band Patient awake    Reviewed: Allergy & Precautions, NPO status , Patient's Chart, lab work & pertinent test results  Airway Mallampati: I  TM Distance: >3 FB Neck ROM: Full    Dental   Pulmonary former smoker,    Pulmonary exam normal        Cardiovascular + CAD, + Past MI and + Cardiac Stents  Normal cardiovascular exam     Neuro/Psych CVA    GI/Hepatic   Endo/Other    Renal/GU      Musculoskeletal   Abdominal   Peds  Hematology   Anesthesia Other Findings   Reproductive/Obstetrics                             Anesthesia Physical Anesthesia Plan  ASA: III and emergent  Anesthesia Plan: General   Post-op Pain Management:    Induction:   PONV Risk Score and Plan: 2 and Ondansetron and Treatment may vary due to age or medical condition  Airway Management Planned: Oral ETT  Additional Equipment:   Intra-op Plan:   Post-operative Plan: Post-operative intubation/ventilation  Informed Consent: I have reviewed the patients History and Physical, chart, labs and discussed the procedure including the risks, benefits and alternatives for the proposed anesthesia with the patient or authorized representative who has indicated his/her understanding and acceptance.     Plan Discussed with: CRNA and Surgeon  Anesthesia Plan Comments:         Anesthesia Quick Evaluation

## 2017-08-18 NOTE — Anesthesia Procedure Notes (Addendum)
Procedure Name: Intubation Date/Time: 08/18/2017 11:25 PM Performed by: Claris Che, CRNA Pre-anesthesia Checklist: Patient identified, Emergency Drugs available, Suction available and Patient being monitored Patient Re-evaluated:Patient Re-evaluated prior to induction Oxygen Delivery Method: Circle system utilized Preoxygenation: Pre-oxygenation with 100% oxygen Induction Type: IV induction, Rapid sequence and Cricoid Pressure applied Laryngoscope Size: Mac and 4 Grade View: Grade III Tube type: Subglottic suction tube Tube size: 7.5 mm Number of attempts: 1 Airway Equipment and Method: Stylet Placement Confirmation: ETT inserted through vocal cords under direct vision,  positive ETCO2 and breath sounds checked- equal and bilateral Secured at: 24 cm Tube secured with: Tape Dental Injury: Teeth and Oropharynx as per pre-operative assessment

## 2017-08-18 NOTE — H&P (Signed)
STROKE H&P  CC: Right-sided weakness, left-sided gaze preference, a aphasia  History is obtained from: EMS, patient's son over the phone  HPI: Thomas Hanna is a 84 y.o. male was a past medical history of coronary artery disease, orthostatic hypotension, prostate cancer, left thalamic stroke with mild residual right hemiparesis since January 2018, who presented to the emergency room as an acute code stroke for sudden onset of right-sided weakness, left-sided gaze preference and a aphasia. But he lives with family.  He was last known normal at 1900 hrs. on 08/18/2017 as witnessed by his son.  Upon checking on him later on that day, he was noted to be completely mute and nonverbal.  He was not able to follow commands.  He had a gaze preference to the left and could not move his right side of the body.  EMS was called.  They assessed him on side and brought him in as an acute code stroke.  Upon initial evaluation performed by me, he was an NIH stroke scale of greater than 20. His symptoms are consistent with a complete left MCA syndrome.  Noncontrast CT of the head was negative for any acute bleed.  He was given IV TPA.  CT angiogram of the head and neck and CT perfusion study were performed which were favorable for intervention for the left distal M1/proximal M2 occlusion.  Neuro endovascular consultation was obtained and the endovascular team was called and he was taken in for thrombectomy.  She is on no blood thinners.  He has had no recent surgeries.  He has no recent strokes.  Son was called over the phone and all the history was obtained from him as much as possible during the acute situation over the phone.  Son and another family member was then met at bedside prior to getting patient in for the endovascular procedure.  They were appraised of the situation and all questions were answered.  Of note, patient was hypertensive with systolic blood pressure in the 161W and diastolics over 960 on arrival.   Mild delays in administering IV TPA due to blood pressure being out of parameters and use of IV labetalol to control blood pressures.  LKW: 1900 hrs. on 08/18/2017 tpa given?:  Yes Premorbid modified Rankin scale (mRS): 1  ROS: unable to obtain due to altered mental status.   Past Medical History:  Diagnosis Date  . Coronary artery disease   . MI (myocardial infarction) ([place])   . Orthostatic hypotension 08/2016  . Prostate cancer ([place])   . Stroke [facility])     Family History  Problem Relation Age of Onset  . Diabetes Mother   . Uterine cancer Sister    Social History:   reports that he quit smoking about 49 years ago. he has never used smokeless tobacco. He reports that he does not drink alcohol or use drugs.  Medications  Current Facility-Administered Medications:  .   stroke: mapping our early stages of recovery book, , Does not apply, Once, Amie Portland, MD .  0.9 %  sodium chloride infusion, , Intravenous, Continuous, Amie Portland, MD .  acetaminophen (TYLENOL) tablet 650 mg, 650 mg, Oral, Q4H PRN **OR** acetaminophen (TYLENOL) solution 650 mg, 650 mg, Per Tube, Q4H PRN **OR** acetaminophen (TYLENOL) suppository 650 mg, 650 mg, Rectal, Q4H PRN, Amie Portland, MD .  ceFAZolin (ANCEF) 2-4 GM/100ML-% IVPB, , , ,  .  eptifibatide (INTEGRILIN) 20 MG/10ML injection, , , ,  .  fentaNYL (SUBLIMAZE) 100 MCG/2ML injection, , , ,  .  iopamidol (ISOVUE-300) 61 % injection, , , ,  .  [COMPLETED] labetalol (NORMODYNE,TRANDATE) injection 20 mg, 20 mg, Intravenous, Once, 10 mg at 08/18/17 2255 **AND** nicardipine (CARDENE) 64m in 0.86% saline 2087mIV infusion (0.1 mg/ml), 0-15 mg/hr, Intravenous, Continuous, ArAmie PortlandMD .  niCARdipine in saline (CARDENE-IV) 20-0.86 MG/200ML-% infusion SOLN, , , ,  .  nitroGLYCERIN 100 MCG/ML intra-arterial injection, , , ,  .  senna-docusate (Senokot-S) tablet 1 tablet, 1 tablet, Oral, QHS PRN, ArAmie PortlandMD  Current Outpatient Medications:  .   cholecalciferol (VITAMIN D) 1000 units tablet, Take 1,000 Units by mouth daily., Disp: , Rfl:  .  clopidogrel (PLAVIX) 75 MG tablet, Take 75 mg by mouth daily., Disp: , Rfl:  .  dorzolamide (TRUSOPT) 2 % ophthalmic solution, Place 1 drop into both eyes 2 (two) times daily., Disp: , Rfl:  .  FERROUS SULFATE PO, Take by mouth., Disp: , Rfl:  .  gabapentin (NEURONTIN) 100 MG capsule, Take 600 mg by mouth 3 (three) times daily. Pt takes 600 mg in the morning and 300 mg at bedtime, Disp: , Rfl:  .  latanoprost (XALATAN) 0.005 % ophthalmic solution, Place 1 drop into both eyes at bedtime. , Disp: , Rfl:  .  midodrine (PROAMATINE) 5 MG tablet, Take 0.5 tablets (2.5 mg total) by mouth 2 (two) times daily with a meal., Disp: 90 tablet, Rfl: 3 .  tamsulosin (FLOMAX) 0.4 MG CAPS capsule, Take 0.4 mg by mouth., Disp: , Rfl:  .  vitamin B-12 (CYANOCOBALAMIN) 100 MCG tablet, Take 100 mcg by mouth daily., Disp: , Rfl:   Facility-Administered Medications Ordered in Other Encounters:  .  ceFAZolin (ANCEF) IVPB 2 g/50 mL premix, , Intravenous, Anesthesia Intra-op, BaClaris CheCRNA, 2 g at 08/18/17 2332 .  fentaNYL (SUBLIMAZE) injection, , , Anesthesia Intra-op, BaClaris CheCRNA, 200 mcg at 08/18/17 2324 .  lactated ringers infusion, , , Continuous PRN, BaClaris CheCRNA .  lidocaine 2% (20 mg/mL) 5 mL syringe, , Intravenous, Anesthesia Intra-op, BaClaris CheCRNA, 100 mg at 08/18/17 2324 .  phenylephrine (NEO-SYNEPHRINE) 0.04 mg/mL in dextrose 5 % 250 mL infusion, , , Continuous PRN, BaClaris CheCRNA, Last Rate: 75 mL/hr at 08/19/17 0015, 50 mcg/min at 08/19/17 0015 .  PHENYLephrine 40 mcg/ml in normal saline Adult IV Push Syringe, , Intravenous, Anesthesia Intra-op, BaClaris CheCRNA, 120 mcg at 08/18/17 2331 .  propofol (DIPRIVAN) 10 mg/mL bolus/IV push, , , Anesthesia Intra-op, BaClaris CheCRNA, 100 mg at 08/18/17 2324 .  rocuronium 10 mg/mL (10 mL) syringe for Medfusion Pump -  Optime, , , Anesthesia Intra-op, BaClaris CheCRNA, 100 mg at 08/18/17 2352 .  succinylcholine (ANECTINE) syringe, , Intravenous, Anesthesia Intra-op, BaClaris CheCRNA, 140 mg at 08/18/17 2324  Exam: Current vital signs: BP (!) 168/111 (BP Location: Right Arm)   Pulse (!) 122   Temp 98.7 F (37.1 C) (Oral)   Resp 20   Wt 82.9 kg (182 lb 12.2 oz)   SpO2 98%   BMI 25.13 kg/m   Vital signs in last 24 hours: Temp:  [98.7 F (37.1 C)] 98.7 F (37.1 C) (01/07 2324) Pulse Rate:  [38-135] 122 (01/07 2324) Resp:  [11-27] 20 (01/07 2324) BP: (130-175)/(81-111) 168/111 (01/07 2324) SpO2:  [94 %-99 %] 98 % (01/07 2324) Weight:  [82.9 kg (182 lb 12.2 oz)] 82.9 kg (182 lb 12.2 oz) (01/07 2100)  GENERAL: Awake, alert in NAD HEENT: -  Normocephalic and atraumatic, dry mm, no LN++, no Thyromegally LUNGS - Clear to auscultation bilaterally with no wheezes CV - S1S2 RRR, no m/r/g, equal pulses bilaterally. ABDOMEN - Soft, nontender, nondistended with normoactive BS Ext: warm, well perfused, no edema. NEURO:  Mental Status: Awake alert and completely aphasic.  Unable to assess speech.  Did not follow any commands. Cranial Nerves: PERRL gaze preference to the left, unable to cross the midline,, visual fields.  Right homonymous hemianopsia as evidenced by no blink to threat from that site,, right lower facial weakness Motor: 0/5 right upper extremity, minimal withdrawal to noxious stimulus in the right lower extremity, 5/5 left upper extremity with no drift, no drift in the left lower extremity but unable to hold above the bed secondary to inability to follow commands. Tone: is normal and bulk is normal Sensation-grossly reduced sensation on the right hemibody to noxious edematous. Coordination: Unable to test Gait- deferred  NIHSS 1a Level of Conscious.: 0 1b LOC Questions: 2 1c LOC Commands: 2 2 Best Gaze: 1 3 Visual: 2 4 Facial Palsy: 2 5a Motor Arm - left: 0 5b Motor Arm - Right:  4 6a Motor Leg - Left: 2 6b Motor Leg - Right: 3 7 Limb Ataxia: 0 8 Sensory: 2 9 Best Language: 3 10 Dysarthria: 2 11 Extinct. and Inatten.: 2 TOTAL: 27  Labs I have reviewed labs in epic and the results pertinent to this consultation are:  CBC    Component Value Date/Time   WBC 10.5 08/18/2017 2151   RBC 3.89 (L) 08/18/2017 2151   HGB 11.9 (L) 08/18/2017 2202   HCT 35.0 (L) 08/18/2017 2202   PLT 209 08/18/2017 2151   MCV 92.3 08/18/2017 2151   MCH 29.6 08/18/2017 2151   MCHC 32.0 08/18/2017 2151   RDW 13.7 08/18/2017 2151   LYMPHSABS 1.8 08/18/2017 2151   MONOABS 0.8 08/18/2017 2151   EOSABS 0.3 08/18/2017 2151   BASOSABS 0.0 08/18/2017 2151   CMP     Component Value Date/Time   NA 139 08/18/2017 2202   K 3.9 08/18/2017 2202   CL 102 08/18/2017 2202   CO2 26 08/18/2017 2151   GLUCOSE 142 (H) 08/18/2017 2202   BUN 27 (H) 08/18/2017 2202   CREATININE 0.80 08/18/2017 2202   CREATININE 0.85 03/26/2017 0902   CALCIUM 8.9 08/18/2017 2151   PROT 6.7 08/18/2017 2151   ALBUMIN 4.0 08/18/2017 2151   AST 23 08/18/2017 2151   ALT 22 08/18/2017 2151   ALKPHOS 65 08/18/2017 2151   BILITOT 0.8 08/18/2017 2151   GFRNONAA >60 08/18/2017 2151   GFRNONAA 76 03/26/2017 0902   GFRAA >60 08/18/2017 2151   GFRAA 88 03/26/2017 0902   Imaging I have reviewed the images obtained:  CT-scan of the brain -no evidence of acute bleed.  Aspects 10.  Hyperdense left M2 superior division, possible thrombus.  Stable chronic microvascular disease.  CT angiogram head and neck and CT perfusion study-distal left M1 occlusion extending into the left M2 superior division.  Early reconstitution of the left M2 via collateralization but poor downstream collateralization and left superior MCA distribution.  Left paraclinoid ICA-has severe stenosis.  Right paraclinoid and terminal ICA occlusion or near occlusion.  CT perfusion of the brain shows cord infarct of 71 cc and a mismatch of 102 cc in the left  MCA territory.  Prior CT angiograms of the head and necks showed significant stenosis in both the right and the left ICAs.  Assessment:  84 year old man  with a modified Rankin score of 1 from prior left thalamic stroke and ensuing mild right hemiparesis presenting for evaluation of acute onset of left MCA syndrome-right-sided weakness, leftward gaze, complete right sensory loss and complete aphasia with an NIH stroke scale of 27. Noncontrast CT of the head did not show any bleed.  IV TPA was administered after reviewing contraindications with the son over the phone. Head and neck vessel imaging reveals left M1/M2 superior division occlusion.  Deemed to be a good candidate for endovascular treatment given less than 6 hours from presentation and a favorable perfusion profile on the CT perfusion as well. Neuro endovascular was consulted and the patient was taken for IAT.   Impression: Acute ischemic stroke of the left MCA territory-likely cardio embolic versus atheroembolic Cerebral infarction due to embolism of left middle cerebral artery   Acuity: Acute Current Suspected Etiology: Cardioembolic/atheroembolic Continue Evaluation:  -Admit to: NICU after intervention-Hold Aspirin until 24 hour post tPA neuroimaging is stable and without evidence of bleeding -Blood pressure control, goal of SYS <180 if intervention is not successful, if intervention is successful-goal blood pressure systolic 016-010. -XNA/TFTD/D2K/GURKY panel. -Hyperglycemia management per SSI to maintain glucose 140-147m/dL. -PT/OT/ST therapies and recommendations when able  CNS -Close neuro monitoring  Dysarthria Dysphagia following cerebral infarction  -NPO until cleared by speech -ST -Advance diet as tolerated  Hemiplegia and hemiparesis following cerebral infarction affecting right dominant side -PT/OT  RESP Intubated for intra-arterial thrombectomy. -vent management per ICU -wean when able  CV Essential  (primary) hypertension -Aggressive BP control as above -TTE  Hyperlipidemia, unspecified  - Statin for goal LDL < 70  HEME Iron Deficiency Anemia -Monitor -transfuse for hgb < 7  -Trend PT/PTT/INR  ENDO -goal HgbA1c < 7  GI/GU -Gentle hydration  Fluid/Electrolyte Disorders -Replete -Repeat labs  ID Possible Aspiration PNA -CXR -NPO -Monitor  Prophylaxis DVT:  SCDs GI: NA Bowel: doc/senna  Diet: NPO until cleared by speech  Code Status: Full Code   -- AAmie Portland MD [facility] [phone] 7pm to 7am, please call on call as listed on AMION.   CRITICAL CARE Performed by: AAmie PortlandTotal critical care time: 60 minutes Critical care time was exclusive of separately billable procedures and treating other patients. Critical care was necessary to treat or prevent imminent or life-threatening deterioration. Critical care was time spent personally by me on the following activities: development of treatment plan with patient and/or surrogate as well as nursing, discussions with consultants, evaluation of patient's response to treatment, examination of patient, obtaining history from patient or surrogate, ordering and performing treatments and interventions, ordering and review of laboratory studies, ordering and review of radiographic studies, pulse oximetry and re-evaluation of patient's condition.  ADDENDUM post procedure Underwent successful IAT of the Left M1 clot with TICI2B flow. Remains intubated and sedated with sheath in place per IR. Repeat CTH post procedure report pasted below: IMPRESSION: Loss of gray-white differentiation within the insula and frontal operculum compatible with evolving acute infarction. No additional area of infarction identified at this time. No intracranial hemorrhage or mass effect. No new recommendations other than SBP 100-140. Stroke team to follow in AM.

## 2017-08-19 ENCOUNTER — Inpatient Hospital Stay (HOSPITAL_COMMUNITY): Payer: Medicare Other

## 2017-08-19 ENCOUNTER — Other Ambulatory Visit: Payer: Self-pay

## 2017-08-19 ENCOUNTER — Encounter (HOSPITAL_COMMUNITY): Payer: Self-pay | Admitting: Interventional Radiology

## 2017-08-19 DIAGNOSIS — L8990 Pressure ulcer of unspecified site, unspecified stage: Secondary | ICD-10-CM

## 2017-08-19 DIAGNOSIS — I361 Nonrheumatic tricuspid (valve) insufficiency: Secondary | ICD-10-CM

## 2017-08-19 DIAGNOSIS — I6340 Cerebral infarction due to embolism of unspecified cerebral artery: Secondary | ICD-10-CM

## 2017-08-19 DIAGNOSIS — I639 Cerebral infarction, unspecified: Secondary | ICD-10-CM

## 2017-08-19 DIAGNOSIS — I4891 Unspecified atrial fibrillation: Secondary | ICD-10-CM

## 2017-08-19 DIAGNOSIS — J9600 Acute respiratory failure, unspecified whether with hypoxia or hypercapnia: Secondary | ICD-10-CM

## 2017-08-19 DIAGNOSIS — I480 Paroxysmal atrial fibrillation: Secondary | ICD-10-CM

## 2017-08-19 HISTORY — PX: IR ANGIO VERTEBRAL SEL SUBCLAVIAN INNOMINATE UNI R MOD SED: IMG5365

## 2017-08-19 HISTORY — PX: IR ANGIO EXTRACRAN SEL COM CAROTID INNOMINATE UNI R MOD SED: IMG5356

## 2017-08-19 HISTORY — PX: IR PERCUTANEOUS ART THROMBECTOMY/INFUSION INTRACRANIAL INC DIAG ANGIO: IMG6087

## 2017-08-19 LAB — CBC WITH DIFFERENTIAL/PLATELET
BASOS PCT: 0 %
Basophils Absolute: 0 10*3/uL (ref 0.0–0.1)
EOS ABS: 0 10*3/uL (ref 0.0–0.7)
Eosinophils Relative: 0 %
HEMATOCRIT: 30.1 % — AB (ref 39.0–52.0)
Hemoglobin: 9.6 g/dL — ABNORMAL LOW (ref 13.0–17.0)
Lymphocytes Relative: 12 %
Lymphs Abs: 1.4 10*3/uL (ref 0.7–4.0)
MCH: 29.2 pg (ref 26.0–34.0)
MCHC: 31.9 g/dL (ref 30.0–36.0)
MCV: 91.5 fL (ref 78.0–100.0)
MONO ABS: 0.5 10*3/uL (ref 0.1–1.0)
MONOS PCT: 4 %
Neutro Abs: 9.5 10*3/uL — ABNORMAL HIGH (ref 1.7–7.7)
Neutrophils Relative %: 84 %
Platelets: 199 10*3/uL (ref 150–400)
RBC: 3.29 MIL/uL — ABNORMAL LOW (ref 4.22–5.81)
RDW: 13.7 % (ref 11.5–15.5)
WBC: 11.4 10*3/uL — ABNORMAL HIGH (ref 4.0–10.5)

## 2017-08-19 LAB — BASIC METABOLIC PANEL
Anion gap: 9 (ref 5–15)
BUN: 24 mg/dL — ABNORMAL HIGH (ref 6–20)
CO2: 21 mmol/L — AB (ref 22–32)
CREATININE: 0.71 mg/dL (ref 0.61–1.24)
Calcium: 8.1 mg/dL — ABNORMAL LOW (ref 8.9–10.3)
Chloride: 106 mmol/L (ref 101–111)
GFR calc non Af Amer: >60 mL/min (ref >60)
Glucose, Bld: 135 mg/dL — ABNORMAL HIGH (ref 65–99)
Potassium: 3.6 mmol/L (ref 3.5–5.1)
SODIUM: 136 mmol/L (ref 135–145)

## 2017-08-19 LAB — BLOOD GAS, ARTERIAL
Acid-base deficit: 1.3 mmol/L (ref 0.0–2.0)
BICARBONATE: 22.4 mmol/L (ref 20.0–28.0)
Drawn by: 51702
FIO2: 100
LHR: 16 {breaths}/min
O2 Saturation: 99.2 %
PEEP: 5 cmH2O
Patient temperature: 98.6
VT: 650 mL
pCO2 arterial: 33.7 mmHg (ref 32.0–48.0)
pH, Arterial: 7.437 (ref 7.350–7.450)
pO2, Arterial: 406 mmHg — ABNORMAL HIGH (ref 83.0–108.0)

## 2017-08-19 LAB — CBC
HCT: 31.2 % — ABNORMAL LOW (ref 39.0–52.0)
Hemoglobin: 9.9 g/dL — ABNORMAL LOW (ref 13.0–17.0)
MCH: 28.7 pg (ref 26.0–34.0)
MCHC: 31.7 g/dL (ref 30.0–36.0)
MCV: 90.4 fL (ref 78.0–100.0)
PLATELETS: 207 10*3/uL (ref 150–400)
RBC: 3.45 MIL/uL — AB (ref 4.22–5.81)
RDW: 13.6 % (ref 11.5–15.5)
WBC: 12.6 10*3/uL — AB (ref 4.0–10.5)

## 2017-08-19 LAB — HEMOGLOBIN A1C
HEMOGLOBIN A1C: 5.7 % — AB (ref 4.8–5.6)
MEAN PLASMA GLUCOSE: 116.89 mg/dL

## 2017-08-19 LAB — LIPID PANEL
CHOL/HDL RATIO: 3.9 ratio
CHOLESTEROL: 140 mg/dL (ref 0–200)
HDL: 36 mg/dL — ABNORMAL LOW (ref >40)
LDL Cholesterol: 87 mg/dL (ref 0–99)
Triglycerides: 86 mg/dL (ref <150)
VLDL: 17 mg/dL (ref 0–40)

## 2017-08-19 LAB — ECHOCARDIOGRAM COMPLETE
Height: 74 in
WEIGHTICAEL: 2836 [oz_av]

## 2017-08-19 LAB — MRSA PCR SCREENING: MRSA by PCR: NEGATIVE

## 2017-08-19 MED ORDER — FENTANYL CITRATE (PF) 100 MCG/2ML IJ SOLN
50.0000 ug | INTRAMUSCULAR | Status: DC | PRN
  Filled 2017-08-19: qty 2

## 2017-08-19 MED ORDER — ASPIRIN 81 MG PO CHEW
81.0000 mg | CHEWABLE_TABLET | Freq: Every day | ORAL | Status: DC
  Administered 2017-08-19 – 2017-08-20 (×2): 81 mg
  Filled 2017-08-19 (×2): qty 1

## 2017-08-19 MED ORDER — SODIUM CHLORIDE 0.9 % IV SOLN
INTRAVENOUS | Status: DC
  Administered 2017-08-19: 06:00:00 via INTRAVENOUS

## 2017-08-19 MED ORDER — ONDANSETRON HCL 4 MG/2ML IJ SOLN
4.0000 mg | Freq: Four times a day (QID) | INTRAMUSCULAR | Status: DC | PRN
  Administered 2017-08-21: 4 mg via INTRAVENOUS
  Filled 2017-08-19 (×2): qty 2

## 2017-08-19 MED ORDER — NICARDIPINE HCL IN NACL 20-0.86 MG/200ML-% IV SOLN: 0.0000 mg/h | INTRAVENOUS | Status: DC

## 2017-08-19 MED ORDER — PHENYLEPHRINE HCL 10 MG/ML IJ SOLN
0.0000 ug/min | INTRAMUSCULAR | Status: DC
  Administered 2017-08-19 (×2): 20 ug/min via INTRAVENOUS
  Administered 2017-08-19: 30 ug/min via INTRAVENOUS
  Administered 2017-08-20: 35 ug/min via INTRAVENOUS
  Filled 2017-08-19 (×2): qty 1
  Filled 2017-08-19: qty 10
  Filled 2017-08-19 (×2): qty 1

## 2017-08-19 MED ORDER — ORAL CARE MOUTH RINSE
15.0000 mL | OROMUCOSAL | Status: DC
  Administered 2017-08-19 – 2017-08-25 (×67): 15 mL via OROMUCOSAL

## 2017-08-19 MED ORDER — PROPOFOL 1000 MG/100ML IV EMUL
0.0000 ug/kg/min | INTRAVENOUS | Status: DC
  Administered 2017-08-19: 30 ug/kg/min via INTRAVENOUS

## 2017-08-19 MED ORDER — FENTANYL CITRATE (PF) 100 MCG/2ML IJ SOLN: 50.0000 ug | INTRAMUSCULAR | Status: DC | PRN

## 2017-08-19 MED ORDER — CHLORHEXIDINE GLUCONATE 0.12% ORAL RINSE (MEDLINE KIT)
15.0000 mL | Freq: Two times a day (BID) | OROMUCOSAL | Status: DC
  Administered 2017-08-19 – 2017-08-28 (×19): 15 mL via OROMUCOSAL

## 2017-08-19 MED ORDER — ACETAMINOPHEN 650 MG RE SUPP: 650.0000 mg | RECTAL | Status: DC | PRN

## 2017-08-19 MED ORDER — ACETAMINOPHEN 325 MG PO TABS
650.0000 mg | ORAL_TABLET | ORAL | Status: DC | PRN
  Filled 2017-08-19: qty 2

## 2017-08-19 MED ORDER — FENTANYL 2500MCG IN NS 250ML (10MCG/ML) PREMIX INFUSION
0.0000 ug/h | INTRAVENOUS | Status: DC
  Administered 2017-08-19: 100 ug/h via INTRAVENOUS
  Administered 2017-08-19: 50 ug/h via INTRAVENOUS
  Administered 2017-08-20 – 2017-08-21 (×3): 200 ug/h via INTRAVENOUS
  Filled 2017-08-19 (×5): qty 250

## 2017-08-19 MED ORDER — CHLORHEXIDINE GLUCONATE 0.12% ORAL RINSE (MEDLINE KIT): 15.0000 mL | Freq: Two times a day (BID) | OROMUCOSAL | Status: DC

## 2017-08-19 MED ORDER — ACETAMINOPHEN 160 MG/5ML PO SOLN
650.0000 mg | ORAL | Status: DC | PRN
  Administered 2017-08-22: 650 mg

## 2017-08-19 MED ORDER — PANTOPRAZOLE SODIUM 40 MG IV SOLR
40.0000 mg | Freq: Every day | INTRAVENOUS | Status: DC
  Administered 2017-08-19 – 2017-08-24 (×6): 40 mg via INTRAVENOUS
  Filled 2017-08-19 (×6): qty 40

## 2017-08-19 MED ORDER — ORAL CARE MOUTH RINSE
15.0000 mL | Freq: Four times a day (QID) | OROMUCOSAL | Status: DC
  Administered 2017-08-19: 15 mL via OROMUCOSAL

## 2017-08-19 MED ORDER — PROPOFOL 500 MG/50ML IV EMUL
INTRAVENOUS | Status: DC | PRN
  Administered 2017-08-19: 40 ug/kg/min via INTRAVENOUS

## 2017-08-19 NOTE — Progress Notes (Signed)
  Echocardiogram 2D Echocardiogram has been performed.  Jannett Celestine 08/19/2017, 11:31 AM

## 2017-08-19 NOTE — Progress Notes (Signed)
OT Cancellation    08/19/17 0900  OT Visit Information  Last OT Received On 08/19/17  Reason Eval/Treat Not Completed Medical issues which prohibited therapy. Pt on vent s/p tPA not yet 24hrs. Will follow up for OT evaluation as schedule allows. Thank you.   [place], OTR/L Acute Rehab Pager: [phone] Office: [phone]

## 2017-08-19 NOTE — Anesthesia Postprocedure Evaluation (Signed)
Anesthesia Post Note  Patient: Thomas Hanna  Procedure(s) Performed: RADIOLOGY WITH ANESTHESIA (N/A )     Patient location during evaluation: SICU Anesthesia Type: General Level of consciousness: sedated Pain management: pain level controlled Vital Signs Assessment: post-procedure vital signs reviewed and stable Respiratory status: patient remains intubated per anesthesia plan Cardiovascular status: stable Postop Assessment: no apparent nausea or vomiting Anesthetic complications: no    Last Vitals:  Vitals:   08/19/17 0345 08/19/17 0400  BP: 126/84 138/72  Pulse: 77 (!) 51  Resp: 18 17  Temp:    SpO2: 92% 97%    Last Pain:  Vitals:   08/18/17 2324  TempSrc: Oral                 Emmory Solivan DAVID

## 2017-08-19 NOTE — Progress Notes (Signed)
Patient arrived from CT via bag/mask valve with CRNA. Placed on mechanical ventilation 50ml/kg. AMBU bag at [facility]. Alarms set.

## 2017-08-19 NOTE — Progress Notes (Signed)
NEUROHOSPITALISTS STROKE TEAM - DAILY PROGRESS NOTE   ADMISSION HISTORY: HPI: Thomas Hanna is a 84 y.o. male was a past medical history of coronary artery disease, orthostatic hypotension, prostate cancer, left thalamic stroke with mild residual right hemiparesis since January 2018, who presented to the emergency room as an acute code stroke for sudden onset of right-sided weakness, left-sided gaze preference and a aphasia. But he lives with family.  He was last known normal at 1900 hrs. on 08/18/2017 as witnessed by his son.  Upon checking on him later on that day, he was noted to be completely mute and nonverbal.  He was not able to follow commands.  He had a gaze preference to the left and could not move his right side of the body.  EMS was called.  They assessed him on side and brought him in as an acute code stroke.  Upon initial evaluation performed by me, he was an NIH stroke scale of greater than 20. His symptoms are consistent with a complete left MCA syndrome.  Noncontrast CT of the head was negative for any acute bleed.  He was given IV TPA.  CT angiogram of the head and neck and CT perfusion study were performed which were favorable for intervention for the left distal M1/proximal M2 occlusion.  Neuro endovascular consultation was obtained and the endovascular team was called and he was taken in for thrombectomy.  He is on no blood thinners.  He has had no recent surgeries.  He has no recent strokes.  Son was called over the phone and all the history was obtained from him as much as possible during the acute situation over the phone.  Son and another family member was then met at bedside prior to getting patient in for the endovascular procedure.  They were appraised of the situation and all questions were answered.  Of note, patient was hypertensive with systolic blood pressure in the 016P and diastolics over 537 on arrival.  Mild delays  in administering IV TPA due to blood pressure being out of parameters and use of IV labetalol to control blood pressures.  LKW: 1900 hrs. on 08/18/2017 tpa given?:  Yes Premorbid modified Rankin scale (mRS): 1 NIHSS 1a Level of Conscious.: 0 1b LOC Questions: 2 1c LOC Commands: 2 2 Best Gaze: 1 3 Visual: 2 4 Facial Palsy: 2 5a Motor Arm - left: 0 5b Motor Arm - Right: 4 6a Motor Leg - Left: 2 6b Motor Leg - Right: 3 7 Limb Ataxia: 0 8 Sensory: 2 9 Best Language: 3 10 Dysarthria: 2 11 Extinct. and Inatten.: 2 TOTAL: 27  SUBJECTIVE (INTERVAL HISTORY) No family is at the bedside. Patient is found laying in bed in NAD, intubated and sedated. Episodes of bradycardia and hypotension reported overnight likely related to propofol.    OBJECTIVE Lab Results: CBC:  Recent Labs  Lab 08/18/17 2151 08/18/17 2202 08/19/17 0304 08/19/17 0926  WBC 10.5  --  11.4* 12.6*  HGB 11.5* 11.9* 9.6* 9.9*  HCT 35.9* 35.0* 30.1* 31.2*  MCV 92.3  --  91.5 90.4  PLT 209  --  199 207   BMP: Recent Labs  Lab 08/18/17 2151 08/18/17  2202 08/19/17 0304  NA 135 139 136  K 3.8 3.9 3.6  CL 103 102 106  CO2 26  --  21*  GLUCOSE 147* 142* 135*  BUN 28* 27* 24*  CREATININE 0.76 0.80 0.71  CALCIUM 8.9  --  8.1*   Liver Function Tests:  Recent Labs  Lab 08/18/17 2151  AST 23  ALT 22  ALKPHOS 65  BILITOT 0.8  PROT 6.7  ALBUMIN 4.0   Coagulation Studies:  Recent Labs    08/18/17 2151  APTT 30  INR 1.04   PHYSICAL EXAM Temp:  [97.2 F (36.2 C)-99.1 F (37.3 C)] 99.1 F (37.3 C) (01/08 1210) Pulse Rate:  [38-135] 95 (01/08 1445) Resp:  [11-27] 16 (01/08 1445) BP: (70-175)/(47-111) 110/66 (01/08 1445) SpO2:  [92 %-100 %] 99 % (01/08 1505) Arterial Line BP: (83-183)/(39-69) 132/50 (01/08 1445) FiO2 (%):  [30 %-100 %] 30 % (01/08 1505) Weight:  [80.4 kg (177 lb 4 oz)-82.9 kg (182 lb 12.2 oz)] 80.4 kg (177 lb 4 oz) (01/08 0300) General - Well nourished, well developed, in no apparent  distress, Intubated and sedated HEENT-  Normocephalic, Normal external eye/conjunctiva.  Normal external ears. Normal external nose, mucus membranes and septum.   Cardiovascular - Regular rate and rhythm  Respiratory - Lungs clear bilaterally. No wheezing. Abdomen - soft and non-tender, BS normal Extremities- no edema or cyanosis NEURO:  Mental Status: Intubated and sedated. Will open eyes brief when name called loudly. Aphasic. Does not follow any commands. Cranial Nerves: PERRL gaze preference to the left, unable to cross the midline,, visual fields.  Right homonymous hemianopsia as evidenced by no blink to threat from that site,, right lower facial weakness Motor: 0/5 right upper extremity, minimal withdrawal to noxious stimulus in the right lower extremity, 5/5 left upper extremity with no drift, no drift in the left lower extremity but unable to hold above the bed secondary to inability to follow commands. Tone: is normal and bulk is normal Sensation-grossly reduced sensation on the right hemibody to noxious edematous. Coordination: Unable to test Gait- deferred  IMAGING: I have personally reviewed the radiological images below and agree with the radiology interpretations.  Ct Head Wo Contrast Result Date: 08/19/2017 IMPRESSION: Loss of gray-white differentiation within the insula and frontal operculum compatible with evolving acute infarction. No additional area of infarction identified at this time. No intracranial hemorrhage or mass effect.  CTA Head/Neck and Cerebral Perfusion W Contrast Result Date: 08/18/2017 IMPRESSION: CTA neck: 1. Patent carotid and vertebral arteries of the neck. 2. Dense calcified plaque of bilateral carotid bifurcations with proximal ICA moderate 60-70% stenosis. 3. Moderate stenosis of right vertebral artery origin. CTA head: 1. Distal left M1 occlusion extending into left M2 superior division. Early reconstitution of left M2 inferior division via  collateralization. Poor downstream collateralization and left superior MCA distribution. 2. Left paraclinoid ICA severe stenosis. 3. Right paraclinoid and terminal ICA occlusion or near occlusion. Contrast opacification of the right upper cervical ICA diminishes gradually to the terminal segment, severe stenosis was present on the prior study. Findings may represent chronic severe stenosis with poor antegrade flow or interval thrombosis. CT brain perfusion: 1. Core infarct volume of 71 cc. 2. Mismatch ischemic penumbra of 102 cc. 3. Infarct location left superior MCA distribution.   Portable Chest Xray Result Date: 08/19/2017 IMPRESSION: Endotracheal tube 4.8 cm from carina. Aortic atherosclerosis. No acute pulmonary process identified.   Dg Abd Portable 1v Result Date: 08/19/2017 IMPRESSION: The esophagogastric tube tip and  proximal port of appear to lie in the stomach.   Ct Head Code Stroke Wo Contrast Result Date: 08/18/2017 IMPRESSION: 1. No acute stroke, hemorrhage, or mass effect identified. 2. Density in left M2 superior division, possible thrombus. 3. ASPECTS is 10 4. Stable chronic microvascular ischemic changes and parenchymal volume loss of the brain.   Echocardiogram:                                               Study Conclusions - Left ventricle: Posterior basal hypokinesis. Wall thickness was   increased in a pattern of mild LVH. The estimated ejection   fraction was 55%. The study is not technically sufficient to   allow evaluation of LV diastolic function. - Mitral valve: Moderately calcified annulus. Moderately thickened,   moderately calcified leaflets . There was mild regurgitation. - Left atrium: The atrium was mildly dilated. - Atrial septum: No defect or patent foramen ovale was identified.   MRI BRAIN:                                                PENDING    IMPRESSION: Thomas Hanna is a 84 y.o. male with PMH of coronary artery disease, orthostatic  hypotension, prostate cancer, left thalamic stroke with mild residual right hemiparesis since January 2018, who presented to the emergency room as an acute code stroke for sudden onset of right-sided weakness, left-sided gaze preference and a aphasia with an NIH stroke scale of 27. Noncontrast CT of the head did not show any bleed.  IV TPA was administered after reviewing contraindications with the son over the phone. Head and neck vessel imaging reveals left M1/M2 superior division occlusion.  Deemed to be a good candidate for endovascular treatment given less than 6 hours from presentation and a favorable perfusion profile on the CT perfusion as well. Neuro endovascular was consulted and the patient was taken for IAT.   Acute ischemic stroke of the left MCA territory Suspected Etiology: likely cardio embolic given new onset AFIB   Resultant Symptoms:  right-sided weakness, left-sided gaze preference and a aphasia. Stroke Risk Factors: hyperlipidemia and hypertension, AFIB Other Stroke Risk Factors: Advanced age, Hx stroke, CAD, Prostate Cancer, Hx of MI  PROCEDURES:  08/19/2017  Dr Estanislado Pandy S/P Lt common carotid arteriogram followed by revascularization of Lt MCA distal M1 and mid perisylvian M3 branch  with mechanical thrombolysis achieving a TICI 2 b reperfusion.  Outstanding Stroke Work-up Studies:     MRI BRAIN  08/19/2017 ASSESSMENT:   Remains intubated and sedated. Aphasic, not following commands. Moving LUE spontaneously and occasionally LLE. No spontaneous movement of Right side. +withdrawal to pain. No family at bedside.  PLAN  08/19/2017: HOLD ASA until 24 hour post tPA neuroimaging is stable & without evidence of bleeding Cardiology consultation for new onset AFIB Remove IR sheath CCM to attempt extubation Maintain strict B/P parameters Frequent neuro checks Telemetry monitoring PT/OT/SLP Consult PM & Rehab Consult Case Management /MSW Ongoing aggressive stroke risk factor  management Patient will be counseled to be compliant with his antithrombotic medications Patient will be counseled on Lifestyle modifications including, Diet, Exercise, and Stress Follow up with [facility] in 6 weeks  HX OF STROKES:  Left thalamic stroke with mild residual right hemiparesis since January 2018  INTRACRANIAL Atherosclerosis &Stenosis: May consider DAPT if appropriate at discharge  DYSPHAGIA: NPO until passes SLP swallow evaluation Aspiration Precautions in progress  AFIB, NEW ONSET: EKG shows A. Fib, episodes of Bradycardia overnight Cardiology Consultation, Appreciate assistance Continue to HOLD [facility] - for now Will need long-term anticoagulation once safe from a stroke standpoint, if appropriate Repeat CT in 2 weeks for consideration of starting anticoagulation if CT is stable.   MEDICAL ISSUES: CCM following, appreciate assistance Inability to protect airway in the postoperative setting Remains intubated/sedated Wean Fentanyl and SBT's   Mild anemia, at baseline  Hgb 9.9 / Hct 31.2 - stable this morning Repeat CBC in AM  HYPERTENSION: Stable, episodes of hypotension noted overnight Given successful l thrombectomy usually in the 120-140 SBP range Nicardipine, Neo and Fentanyl drip, Labetolol PRN Long term BP goal normotensive. Home Meds: NONE  HYPERLIPIDEMIA:    Component Value Date/Time   CHOL 140 08/19/2017 0304   TRIG 86 08/19/2017 0304   HDL 36 (L) 08/19/2017 0304   CHOLHDL 3.9 08/19/2017 0304   VLDL 17 08/19/2017 0304   LDLCALC 87 08/19/2017 0304  Home Meds:  Lovaza LDL  goal < 70 Will continued Lovaza and start Lipitor to 10 mg daily, if passes SLP evaluation Continue statin at discharge, if appropriate  R/O DIABETES: Lab Results  Component Value Date   HGBA1C 5.7 (H) 08/19/2017  HgbA1c goal < 7.0  Other Active Problems: Active Problems:   Stroke (cerebrum) (HCC)   Pressure injury of skin    Hospital day # 1 VTE  prophylaxis: SCD's  Diet : Diet NPO time specified   FAMILY UPDATES: No family at bedside  TEAM UPDATES: Garvin Fila, MD STATUS:  FULL   Prior Home Stroke Medications:  clopidogrel 75 mg daily  Discharge Stroke Meds:  Please discharge patient on TBD   Disposition: 01-Home or Self Care Therapy Recs:               PENDING Home Equipment:         PENDING Follow Up:  Follow-up Information    Garvin Fila, MD. Schedule an appointment as soon as possible for a visit in 6 week(s).   Specialties:  Neurology, Radiology Contact information: [address] [place] [address] [place] Not In -PCP Follow up in 1-2 weeks   Case Management aware of need   Assessment & plan discussed with with attending physician and they are in agreement.    Mary Sella, ANP-C Stroke Neurology Team 08/19/2017 3:35 PM I have personally examined this patient, reviewed notes, independently viewed imaging studies, participated in medical decision making and plan of care.ROS completed by me personally and pertinent positives fully documented  I have made any additions or clarifications directly to the above note. Agree with note above. . Patient has presented with a large left MCA infarcts due to new onset atrial fibrillation and underwent treatment with IV TPA and mechanical thrombectomy with partial recanalization. Prognosis is guarded. Plan discontinue arterial groin sheath and check MRI scan later today. They decide on extubation tomorrow after discussion with family and goals of care about reintubation versus tracheostomy and CODE STATUS discussion. Discussed with Dr.Jeong from critical care medicine This patient is critically ill and at significant risk of neurological worsening, death and care requires constant monitoring of vital signs, hemodynamics,respiratory and cardiac monitoring,  extensive review of multiple databases, frequent neurological assessment,  discussion with family, other specialists and medical decision making of high complexity.I have made any additions or clarifications directly to the above note.This critical care time does not reflect procedure time, or teaching time or supervisory time of PA/NP/Med Resident etc but could involve care discussion time.  I spent 30 minutes of neurocritical care time  in the care of  this patient.      Antony Contras, MD Medical Director [facility] Pager: [phone] 08/19/2017 5:27 PM  To contact Stroke Continuity provider, please refer to [email or web address] After hours, contact General Neurology

## 2017-08-19 NOTE — Progress Notes (Signed)
[facility] Progress Note Patient Name: Thomas Hanna DOB: [birth year] MRN: [id number]   Date of Service  08/19/2017  HPI/Events of Note  Hypotension - BP = 83/39.  eICU Interventions  Will order: 1. Phenylephrine IV infusion. Titrate to SBP = 120 - 140.     Intervention Category Major Interventions: Hypotension - evaluation and management  Sommer,Steven Eugene 08/19/2017, 3:18 AM

## 2017-08-19 NOTE — Procedures (Signed)
S/P Lt common carotid arteriogram followed by revascularization of Lt MCA distal M1 and mid perisylvian M3 branch  with mechanical thrombolysis achieving a TICI 2 b reperfusion.

## 2017-08-19 NOTE — Care Management Note (Signed)
Case Management Note  Patient Details  Name: Thomas Hanna MRN: [id number] Date of Birth: [birth year]  Subjective/Objective:   Pt admitted on 1/7 with complaints of right-sided weakness, left-sided gaze preference, and aphasia.  CT was negative for acute bleed.  Further cerebral imaging demonstrated left distal M1/proximal M2 occlusion.  He was taken to interventional radiology and underwent mechanical thrombolysis.  PTA, pt resided at home with his son.    Action/Plan: Pt currently sedated and on ventilator.  Will follow for discharge planning as pt progresses.    Expected Discharge Date:                  Expected Discharge Plan:     In-House Referral:     Discharge planning Services  CM Consult  Post Acute Care Choice:    Choice offered to:     DME Arranged:    DME Agency:     HH Arranged:    HH Agency:     Status of Service:  In process, will continue to follow  If discussed at Long Length of Stay Meetings, dates discussed:    Additional Comments:  Reinaldo Raddle, RN, BSN  Trauma/Neuro ICU Case Manager [phone]

## 2017-08-19 NOTE — Consult Note (Signed)
Cardiology Consultation:   Patient ID: Thomas Hanna; [id number]; 14-Oct-1923   Admit date: 08/18/2017 Date of Consult: 08/19/2017  Primary Care Provider: [facility], [facility] Not In Primary Cardiologist: Shelva Majestic, MD  Primary Electrophysiologist:     Patient Profile:   Thomas Hanna is a 84 y.o. male with a hx of CAD s/p CABG 2007, PCI in 2013 and 2014 (in [place], Dr Augusto Garbe), hx of stroke (2018), orthostatic hypotension, and prostate cancer who is being seen today for the evaluation of atrial fibrillation at the request of Dr. Leonie Man.  History of Present Illness:   Thomas Hanna has a history of CAD and stroke. He was admitted 08/18/17 with stroke symptoms and underwent TPA and mechanical thrombolysis. He was intubated for airway protection. While intubated, he was found to be in Afib. There is no documented history for this and its thought to be new onset. Confirmed with 12-lead EKG, rate controlled. Pt had some episodes of bradycardia overnight thought to be related to propofol. No further bradycardia after switching to fentanyl.  Level 5 Caveat  -patient is intubated, no family at bedside.   Past Medical History:  Diagnosis Date  . Coronary artery disease   . MI (myocardial infarction) ([place])   . Orthostatic hypotension 08/2016  . Prostate cancer ([place])   . Stroke [facility])     Past Surgical History:  Procedure Laterality Date  . cardiac stents    . CARDIAC SURGERY    . CORONARY ARTERY BYPASS GRAFT       Home Medications:  Prior to Admission medications   Medication Sig Start Date End Date Taking? Authorizing Provider  Ascorbic Acid (VITAMIN C) 100 MG tablet Take 100 mg by mouth daily.   Yes [provider]  cholecalciferol (VITAMIN D) 1000 units tablet Take 1,000 Units by mouth daily.   Yes [provider]  clopidogrel (PLAVIX) 75 MG tablet Take 75 mg by mouth daily.   Yes [provider]  dorzolamide (TRUSOPT) 2 % ophthalmic solution Place 1  drop into both eyes 2 (two) times daily.   Yes [provider]  ferrous sulfate 325 (65 FE) MG tablet Take 325 mg by mouth every morning.    Yes [provider]  folic acid (FOLVITE) 010 MCG tablet Take 400 mcg by mouth daily.   Yes [provider]  gabapentin (NEURONTIN) 100 MG capsule Take 300-600 mg by mouth See admin instructions. Pt takes 600 mg in the morning and noon and 300 mg at bedtime   Yes [provider]  Ginkgo Biloba (GINKOBA PO) Take 1 capsule by mouth daily.   Yes [provider]  latanoprost (XALATAN) 0.005 % ophthalmic solution Place 1 drop into both eyes at bedtime.    Yes [provider]  midodrine (PROAMATINE) 5 MG tablet Take 0.5 tablets (2.5 mg total) by mouth 2 (two) times daily with a meal. 05/15/17  Yes Troy Sine, MD  omega-3 acid ethyl esters (LOVAZA) 1 g capsule Take 1 g by mouth 2 (two) times daily.   Yes [provider]  tamsulosin (FLOMAX) 0.4 MG CAPS capsule Take 0.4 mg by mouth daily after supper.    Yes [provider]  vitamin B-12 (CYANOCOBALAMIN) 100 MCG tablet Take 100 mcg by mouth daily.   Yes [provider]    Inpatient Medications: Scheduled Meds: .  stroke: mapping our early stages of recovery book   Does not apply Once  . chlorhexidine gluconate (MEDLINE KIT)  15 mL  Mouth Rinse BID  . mouth rinse  15 mL Mouth Rinse 10 times per day  . pantoprazole (PROTONIX) IV  40 mg Intravenous Daily   Continuous Infusions: . sodium chloride    . sodium chloride 75 mL/hr at 08/19/17 0610  . fentaNYL infusion INTRAVENOUS 150 mcg/hr (08/19/17 1140)  . niCARDipine    . phenylephrine (NEO-SYNEPHRINE) Adult infusion 20 mcg/min (08/19/17 1032)   PRN Meds: acetaminophen **OR** acetaminophen (TYLENOL) oral liquid 160 mg/5 mL **OR** acetaminophen, fentaNYL (SUBLIMAZE) injection, fentaNYL (SUBLIMAZE) injection, ondansetron (ZOFRAN) IV, senna-docusate  Allergies:    Allergies    Allergen Reactions  . Sulfa Antibiotics Other (See Comments)    Unknown    Social History:   Social History   Socioeconomic History  . Marital status: Widowed    Spouse name: Not on file  . Number of children: Not on file  . Years of education: Not on file  . Highest education level: Not on file  Social Needs  . Financial resource strain: Not on file  . Food insecurity - worry: Not on file  . Food insecurity - inability: Not on file  . Transportation needs - medical: Not on file  . Transportation needs - non-medical: Not on file  Occupational History  . Not on file  Tobacco Use  . Smoking status: Former Smoker    Last attempt to quit: 1970    Years since quitting: 49.0  . Smokeless tobacco: Never Used  Substance and Sexual Activity  . Alcohol use: No  . Drug use: No  . Sexual activity: Not on file  Other Topics Concern  . Not on file  Social History Narrative  . Not on file    Family History:    Family History  Problem Relation Age of Onset  . Diabetes Mother   . Uterine cancer Sister      ROS:  Please see the history of present illness.  ROS  Level 5 Caveat  Physical Exam/Data:   Vitals:   08/19/17 1115 08/19/17 1130 08/19/17 1145 08/19/17 1210  BP: 121/72 130/68 127/60   Pulse: 83 75 77   Resp: 19 16 16    Temp:      TempSrc:      SpO2: 100% 100% 100% 100%  Weight:      Height:        Intake/Output Summary (Last 24 hours) at 08/19/2017 1318 Last data filed at 08/19/2017 1151 Gross per 24 hour  Intake 1403.51 ml  Output 1350 ml  Net 53.51 ml   Filed Weights   08/18/17 2100 08/19/17 0300  Weight: 182 lb 12.2 oz (82.9 kg) 177 lb 4 oz (80.4 kg)   Body mass index is 22.76 kg/m.  General:  Elderly frail male, intubated HEENT: normal Neck: no JVD Vascular: No carotid bruits  Cardiac:  normal S1, S2; RRR; no murmur Lungs:  intubated Abd: soft, nontender, no hepatomegaly  Ext: no edema Musculoskeletal:  No deformities, BUE and BLE strength  normal and equal Skin: warm and dry  Neuro:  Intubated and sedated    EKG:  The EKG was personally reviewed and demonstrates:  Afib, rate controlled Telemetry:  Telemetry was personally reviewed and demonstrates:  Afib  Relevant CV Studies:  Echo 08/19/17: Study Conclusions - Left ventricle: Posterior basal hypokinesis. Wall thickness was   increased in a pattern of mild LVH. The estimated ejection   fraction was 55%. The study is not technically sufficient to   allow evaluation of LV diastolic function. -  Mitral valve: Moderately calcified annulus. Moderately thickened,   moderately calcified leaflets . There was mild regurgitation. - Left atrium: The atrium was mildly dilated. - Atrial septum: No defect or patent foramen ovale was identified.   Laboratory Data:  Chemistry Recent Labs  Lab 08/18/17 2151 08/18/17 2202 08/19/17 0304  NA 135 139 136  K 3.8 3.9 3.6  CL 103 102 106  CO2 26  --  21*  GLUCOSE 147* 142* 135*  BUN 28* 27* 24*  CREATININE 0.76 0.80 0.71  CALCIUM 8.9  --  8.1*  GFRNONAA >60  --  >60  GFRAA >60  --  >60  ANIONGAP 6  --  9    Recent Labs  Lab 08/18/17 2151  PROT 6.7  ALBUMIN 4.0  AST 23  ALT 22  ALKPHOS 65  BILITOT 0.8   Hematology Recent Labs  Lab 08/18/17 2151 08/18/17 2202 08/19/17 0304 08/19/17 0926  WBC 10.5  --  11.4* 12.6*  RBC 3.89*  --  3.29* 3.45*  HGB 11.5* 11.9* 9.6* 9.9*  HCT 35.9* 35.0* 30.1* 31.2*  MCV 92.3  --  91.5 90.4  MCH 29.6  --  29.2 28.7  MCHC 32.0  --  31.9 31.7  RDW 13.7  --  13.7 13.6  PLT 209  --  199 207   Cardiac EnzymesNo results for input(s): TROPONINI in the last 168 hours.  Recent Labs  Lab 08/18/17 2202  TROPIPOC 0.01    BNPNo results for input(s): BNP, PROBNP in the last 168 hours.  DDimer No results for input(s): DDIMER in the last 168 hours.  Radiology/Studies:  Ct Angio Head W Or Wo Contrast  Result Date: 08/18/2017 CLINICAL DATA:  84 y/o M; right-sided weakness and speech  difficulty. EXAM: CT ANGIOGRAPHY HEAD AND NECK CT PERFUSION BRAIN TECHNIQUE: Multidetector CT imaging of the head and neck was performed using the standard protocol during bolus administration of intravenous contrast. Multiplanar CT image reconstructions and MIPs were obtained to evaluate the vascular anatomy. Carotid stenosis measurements (when applicable) are obtained utilizing NASCET criteria, using the distal internal carotid diameter as the denominator. Multiphase CT imaging of the brain was performed following IV bolus contrast injection. Subsequent parametric perfusion maps were calculated using RAPID software. CONTRAST:  14m ISOVUE-370 IOPAMIDOL (ISOVUE-370) INJECTION 76% COMPARISON:  09/04/2016 CTA head. FINDINGS: CTA NECK FINDINGS Aortic arch: Bovine variant branching. Imaged portion shows no evidence of aneurysm or dissection. No significant stenosis of the major arch vessel origins. Moderate calcific atherosclerosis. Right carotid system: Dense calcified plaque of right carotid bifurcation with 60-70% proximal ICA stenosis. Left carotid system: Dense calcified plaque of left carotid bifurcation with 60-70% proximal ICA stenosis. \ Vertebral arteries: Calcified plaque of right vertebral artery origin with moderate 50-70% stenosis. Patent right vertebral artery. Skeleton: Mild cervical spondylosis. No high-grade bony canal stenosis per Other neck: Negative. Upper chest: Right greater than left apical pleuroparenchymal scarring. Review of the MIP images confirms the above findings CTA HEAD FINDINGS Anterior circulation: Right paraclinoid and terminal ICA occlusion/near occlusion. Contrast opacification of the right upper cervical ICA diminishes gradually to the terminal segment of ICA suggesting a degree of patency, severe stenosis was present on the prior study, this may represent chronic severe stenosis with poor antegrade flow or interval thrombosis. Severe left paraclinoid ICA stenosis. Distal left M1  occlusion extending into left M2 superior division. Early reconstitution of left M2 inferior division via collateralization. Poor downstream collateralization in the superior left MCA distribution. Posterior circulation: No significant  stenosis, proximal occlusion, aneurysm, or vascular malformation. Venous sinuses: As permitted by contrast timing, patent. Anatomic variants: Fetal right PCA. Patent anterior communicating artery. No left posterior communicating artery identified, likely hypoplastic or absent. Review of the MIP images confirms the above findings CT Brain Perfusion Findings: CBF (<30%) Volume: 67m Perfusion (Tmax>6.0s) volume: 1738mMismatch Volume: 10228mnfarction Location:Left superior MCA IMPRESSION: CTA neck: 1. Patent carotid and vertebral arteries of the neck. 2. Dense calcified plaque of bilateral carotid bifurcations with proximal ICA moderate 60-70% stenosis. 3. Moderate stenosis of right vertebral artery origin. CTA head: 1. Distal left M1 occlusion extending into left M2 superior division. Early reconstitution of left M2 inferior division via collateralization. Poor downstream collateralization and left superior MCA distribution. 2. Left paraclinoid ICA severe stenosis. 3. Right paraclinoid and terminal ICA occlusion or near occlusion. Contrast opacification of the right upper cervical ICA diminishes gradually to the terminal segment, severe stenosis was present on the prior study. Findings may represent chronic severe stenosis with poor antegrade flow or interval thrombosis. CT brain perfusion: 1. Core infarct volume of 71 cc. 2. Mismatch ischemic penumbra of 102 cc. 3. Infarct location left superior MCA distribution. These results were called by telephone at the time of interpretation on 08/18/2017 at 10:38 pm to Dr. ASHAmie Portlandwho verbally acknowledged these results. Electronically Signed   By: LanKristine GarbeD.   On: 08/18/2017 22:54   Ct Head Wo Contrast  Result  Date: 08/19/2017 CLINICAL DATA:  [age] M; status post intra-arterial intervention for stroke. EXAM: CT HEAD WITHOUT CONTRAST TECHNIQUE: Contiguous axial images were obtained from the base of the skull through the vertex without intravenous contrast. COMPARISON:  08/18/2016 CT head, CT angiogram head, CT perfusion head. FINDINGS: Brain: Loss of gray-white differentiation in the insula and frontal operculum best appreciated on coronal reconstruction (series 5, image 32) compatible with evolving infarction. No additional area of infarction is identified at this time. No acute hemorrhage. Stable background of chronic microvascular ischemic changes and parenchymal volume loss of the brain. No hydrocephalus, extra-axial collection, or effacement of basilar cisterns. Vascular: Persistent contrast enhancement. Skull: Normal. Negative for fracture or focal lesion. Sinuses/Orbits: No acute finding. Other: None. IMPRESSION: Loss of gray-white differentiation within the insula and frontal operculum compatible with evolving acute infarction. No additional area of infarction identified at this time. No intracranial hemorrhage or mass effect. Electronically Signed   By: LanKristine GarbeD.   On: 08/19/2017 02:59   Ct Angio Neck W Or Wo Contrast  Result Date: 08/18/2017 CLINICAL DATA:  [age] M; right-sided weakness and speech difficulty. EXAM: CT ANGIOGRAPHY HEAD AND NECK CT PERFUSION BRAIN TECHNIQUE: Multidetector CT imaging of the head and neck was performed using the standard protocol during bolus administration of intravenous contrast. Multiplanar CT image reconstructions and MIPs were obtained to evaluate the vascular anatomy. Carotid stenosis measurements (when applicable) are obtained utilizing NASCET criteria, using the distal internal carotid diameter as the denominator. Multiphase CT imaging of the brain was performed following IV bolus contrast injection. Subsequent parametric perfusion maps were calculated  using RAPID software. CONTRAST:  100m96mOVUE-370 IOPAMIDOL (ISOVUE-370) INJECTION 76% COMPARISON:  09/04/2016 CTA head. FINDINGS: CTA NECK FINDINGS Aortic arch: Bovine variant branching. Imaged portion shows no evidence of aneurysm or dissection. No significant stenosis of the major arch vessel origins. Moderate calcific atherosclerosis. Right carotid system: Dense calcified plaque of right carotid bifurcation with 60-70% proximal ICA stenosis. Left carotid system: Dense calcified plaque of left carotid bifurcation with  60-70% proximal ICA stenosis. \ Vertebral arteries: Calcified plaque of right vertebral artery origin with moderate 50-70% stenosis. Patent right vertebral artery. Skeleton: Mild cervical spondylosis. No high-grade bony canal stenosis per Other neck: Negative. Upper chest: Right greater than left apical pleuroparenchymal scarring. Review of the MIP images confirms the above findings CTA HEAD FINDINGS Anterior circulation: Right paraclinoid and terminal ICA occlusion/near occlusion. Contrast opacification of the right upper cervical ICA diminishes gradually to the terminal segment of ICA suggesting a degree of patency, severe stenosis was present on the prior study, this may represent chronic severe stenosis with poor antegrade flow or interval thrombosis. Severe left paraclinoid ICA stenosis. Distal left M1 occlusion extending into left M2 superior division. Early reconstitution of left M2 inferior division via collateralization. Poor downstream collateralization in the superior left MCA distribution. Posterior circulation: No significant stenosis, proximal occlusion, aneurysm, or vascular malformation. Venous sinuses: As permitted by contrast timing, patent. Anatomic variants: Fetal right PCA. Patent anterior communicating artery. No left posterior communicating artery identified, likely hypoplastic or absent. Review of the MIP images confirms the above findings CT Brain Perfusion Findings: CBF  (<30%) Volume: 58m Perfusion (Tmax>6.0s) volume: 177mMismatch Volume: 10266mnfarction Location:Left superior MCA IMPRESSION: CTA neck: 1. Patent carotid and vertebral arteries of the neck. 2. Dense calcified plaque of bilateral carotid bifurcations with proximal ICA moderate 60-70% stenosis. 3. Moderate stenosis of right vertebral artery origin. CTA head: 1. Distal left M1 occlusion extending into left M2 superior division. Early reconstitution of left M2 inferior division via collateralization. Poor downstream collateralization and left superior MCA distribution. 2. Left paraclinoid ICA severe stenosis. 3. Right paraclinoid and terminal ICA occlusion or near occlusion. Contrast opacification of the right upper cervical ICA diminishes gradually to the terminal segment, severe stenosis was present on the prior study. Findings may represent chronic severe stenosis with poor antegrade flow or interval thrombosis. CT brain perfusion: 1. Core infarct volume of 71 cc. 2. Mismatch ischemic penumbra of 102 cc. 3. Infarct location left superior MCA distribution. These results were called by telephone at the time of interpretation on 08/18/2017 at 10:38 pm to Dr. ASHAmie Portlandwho verbally acknowledged these results. Electronically Signed   By: LanKristine GarbeD.   On: 08/18/2017 22:54   Ct Cerebral Perfusion W Contrast  Result Date: 08/18/2017 CLINICAL DATA:  [age] M; right-sided weakness and speech difficulty. EXAM: CT ANGIOGRAPHY HEAD AND NECK CT PERFUSION BRAIN TECHNIQUE: Multidetector CT imaging of the head and neck was performed using the standard protocol during bolus administration of intravenous contrast. Multiplanar CT image reconstructions and MIPs were obtained to evaluate the vascular anatomy. Carotid stenosis measurements (when applicable) are obtained utilizing NASCET criteria, using the distal internal carotid diameter as the denominator. Multiphase CT imaging of the brain was performed  following IV bolus contrast injection. Subsequent parametric perfusion maps were calculated using RAPID software. CONTRAST:  100m28mOVUE-370 IOPAMIDOL (ISOVUE-370) INJECTION 76% COMPARISON:  09/04/2016 CTA head. FINDINGS: CTA NECK FINDINGS Aortic arch: Bovine variant branching. Imaged portion shows no evidence of aneurysm or dissection. No significant stenosis of the major arch vessel origins. Moderate calcific atherosclerosis. Right carotid system: Dense calcified plaque of right carotid bifurcation with 60-70% proximal ICA stenosis. Left carotid system: Dense calcified plaque of left carotid bifurcation with 60-70% proximal ICA stenosis. \ Vertebral arteries: Calcified plaque of right vertebral artery origin with moderate 50-70% stenosis. Patent right vertebral artery. Skeleton: Mild cervical spondylosis. No high-grade bony canal stenosis per Other neck: Negative. Upper chest: Right  greater than left apical pleuroparenchymal scarring. Review of the MIP images confirms the above findings CTA HEAD FINDINGS Anterior circulation: Right paraclinoid and terminal ICA occlusion/near occlusion. Contrast opacification of the right upper cervical ICA diminishes gradually to the terminal segment of ICA suggesting a degree of patency, severe stenosis was present on the prior study, this may represent chronic severe stenosis with poor antegrade flow or interval thrombosis. Severe left paraclinoid ICA stenosis. Distal left M1 occlusion extending into left M2 superior division. Early reconstitution of left M2 inferior division via collateralization. Poor downstream collateralization in the superior left MCA distribution. Posterior circulation: No significant stenosis, proximal occlusion, aneurysm, or vascular malformation. Venous sinuses: As permitted by contrast timing, patent. Anatomic variants: Fetal right PCA. Patent anterior communicating artery. No left posterior communicating artery identified, likely hypoplastic or  absent. Review of the MIP images confirms the above findings CT Brain Perfusion Findings: CBF (<30%) Volume: 20m Perfusion (Tmax>6.0s) volume: 1755mMismatch Volume: 1022mnfarction Location:Left superior MCA IMPRESSION: CTA neck: 1. Patent carotid and vertebral arteries of the neck. 2. Dense calcified plaque of bilateral carotid bifurcations with proximal ICA moderate 60-70% stenosis. 3. Moderate stenosis of right vertebral artery origin. CTA head: 1. Distal left M1 occlusion extending into left M2 superior division. Early reconstitution of left M2 inferior division via collateralization. Poor downstream collateralization and left superior MCA distribution. 2. Left paraclinoid ICA severe stenosis. 3. Right paraclinoid and terminal ICA occlusion or near occlusion. Contrast opacification of the right upper cervical ICA diminishes gradually to the terminal segment, severe stenosis was present on the prior study. Findings may represent chronic severe stenosis with poor antegrade flow or interval thrombosis. CT brain perfusion: 1. Core infarct volume of 71 cc. 2. Mismatch ischemic penumbra of 102 cc. 3. Infarct location left superior MCA distribution. These results were called by telephone at the time of interpretation on 08/18/2017 at 10:38 pm to Dr. ASHAmie Portlandwho verbally acknowledged these results. Electronically Signed   By: LanKristine GarbeD.   On: 08/18/2017 22:54   Portable Chest Xray  Result Date: 08/19/2017 CLINICAL DATA:  [age] M; encounter for intubation, shortness of breath. EXAM: PORTABLE CHEST 1 VIEW COMPARISON:  02/14/2017 chest radiograph FINDINGS: Endotracheal tube 4.8 cm above the carina. Stable normal cardiac silhouette given projection and technique. Aortic atherosclerosis. Status post CABG. Sternotomy wires are aligned. No consolidation, effusion, or pneumothorax identified. Multilevel degenerative changes of thoracic spine. IMPRESSION: Endotracheal tube 4.8 cm from carina.  Aortic atherosclerosis. No acute pulmonary process identified. Electronically Signed   By: LanKristine GarbeD.   On: 08/19/2017 02:53   Dg Abd Portable 1v  Result Date: 08/19/2017 CLINICAL DATA:  Status post orogastric tube placement. EXAM: PORTABLE ABDOMEN - 1 VIEW COMPARISON:  None in PACs FINDINGS: The orogastric tube tip and proximal port projects below the left hemidiaphragm. The tip of the tube likely lies in the mid gastric body. The proximal port lies below the expected location of the GE junction. The bowel gas pattern is within the limits of normal. IMPRESSION: The esophagogastric tube tip and proximal port of appear to lie in the stomach. Electronically Signed   By: David  JorMartiniqueD.   On: 08/19/2017 07:20   Ct Head Code Stroke Wo Contrast  Result Date: 08/18/2017 CLINICAL DATA:  Code stroke. [age] M; right-sided weakness and speech difficulty. EXAM: CT HEAD WITHOUT CONTRAST TECHNIQUE: Contiguous axial images were obtained from the base of the skull through the vertex without intravenous contrast. COMPARISON:  09/04/2016 CT angiogram head.  12/07/2016 MRI head. FINDINGS: Brain: No evidence of acute infarction, hemorrhage, hydrocephalus, extra-axial collection or mass lesion/mass effect. Stable background of chronic microvascular ischemic changes and brain parenchymal volume loss. Small chronic infarct within left thalamus. Vascular: Calcific atherosclerosis of carotid siphons. Density within left M2 superior division (series 6, image 47). Skull: Normal. Negative for fracture or focal lesion. Sinuses/Orbits: No acute finding. Other: Bilateral intra-ocular lens replacement. ASPECTS [facility] Stroke Program Early CT Score) - Ganglionic level infarction (caudate, lentiform nuclei, internal capsule, insula, M1-M3 cortex): 7 - Supraganglionic infarction (M4-M6 cortex): 3 Total score (0-10 with 10 being normal): 10 IMPRESSION: 1. No acute stroke, hemorrhage, or mass effect identified. 2. Density  in left M2 superior division, possible thrombus. 3. ASPECTS is 10 4. Stable chronic microvascular ischemic changes and parenchymal volume loss of the brain. These results were called by telephone at the time of interpretation on 08/18/2017 at 10:11 pm to Dr. Carmin Muskrat , who verbally acknowledged these results. Electronically Signed   By: Kristine Garbe M.D.   On: 08/18/2017 22:11    Assessment and Plan:   1. New onset Atrial fibrillation - pt received TPA and mechanical thrombolysis 08/18/17 - echo with normal EF and mildly dilated left atrium - no EKG evidence of prior Afib - episodes of bradycardia overnight thought to be related to propofol, no further episodes on fentanyl; of note he received IV labetalol overnight - pt is rate controlled without AV blocking agents or antiarrhythmics, pressure stable - no anticoagulation now in setting of large stroke and TPA - AC per neurology team   2. Stroke - MRI pending - Hb stable - no anticoagulation at this point    For questions or updates, please contact [place] Please consult [email or web address] for contact info under Cardiology/STEMI.   Signed, Tami Lin Duke, PA  08/19/2017 1:18 PM

## 2017-08-19 NOTE — Transfer of Care (Signed)
Immediate Anesthesia Transfer of Care Note  Patient: Thomas Hanna  Procedure(s) Performed: RADIOLOGY WITH ANESTHESIA (N/A )  Patient Location: ICU  Anesthesia Type:General  Level of Consciousness: sedated and Patient remains intubated per anesthesia plan  Airway & Oxygen Therapy: Patient remains intubated per anesthesia plan and Patient placed on Ventilator (see vital sign flow sheet for setting)  Post-op Assessment: Report given to RN  Post vital signs: Reviewed and stable  Last Vitals:  Vitals:   08/18/17 2306 08/18/17 2324  BP: (!) 149/103 (!) 168/111  Pulse: 96 (!) 122  Resp: (!) 21 20  Temp:  37.1 C  SpO2: 96% 98%    Last Pain:  Vitals:   08/18/17 2324  TempSrc: Oral         Complications: No apparent anesthesia complications

## 2017-08-19 NOTE — Progress Notes (Signed)
PT Cancellation Note  Patient Details Name: Thomas Hanna MRN: [id number] DOB: 1924-04-30   Cancelled Treatment:    Reason Eval/Treat Not Completed: Medical issues which prohibited therapy(pt on vent s/p tPA not yet 24hrs)   Deandrea Vanpelt B Pistol Kessenich 08/19/2017, 7:05 AM  Elwyn Reach, [place]

## 2017-08-19 NOTE — Progress Notes (Signed)
Paged E Link Md regarding hypotension in pt, awaiting orders

## 2017-08-19 NOTE — Progress Notes (Signed)
Initial Nutrition Assessment  DOCUMENTATION CODES:   Not applicable  INTERVENTION:   If remains intubated recommend: Vital AF 1.2 @ 60 ml/hr (1440 ml/day) Provides: 1728 kcal, 108 grams protein, and 1167 ml free water.    NUTRITION DIAGNOSIS:   Inadequate oral intake related to inability to eat as evidenced by NPO status.  GOAL:   Patient will meet greater than or equal to 90% of their needs  MONITOR:   Vent status, I & O's  REASON FOR ASSESSMENT:   Ventilator    ASSESSMENT:   Pt with PMH of CAD, prostate Ca, CVA admitted with new afib, ICH s/p tPA and thrombolysis.    Patient is currently intubated on ventilator support MV: 11 L/min Temp (24hrs), Avg:98.4 F (36.9 C), Min:97.2 F (36.2 C), Max:99.1 F (37.3 C)  Medications and labs reviewed   Neo-synephrine infusing but dose decreasing  NUTRITION - FOCUSED PHYSICAL EXAM:    Most Recent Value  Orbital Region  No depletion  Upper Arm Region  No depletion  Thoracic and Lumbar Region  No depletion  Buccal Region  Unable to assess  Temple Region  No depletion  Clavicle Bone Region  No depletion  Clavicle and Acromion Bone Region  No depletion  Scapular Bone Region  Unable to assess  Dorsal Hand  No depletion  Patellar Region  No depletion  Anterior Thigh Region  No depletion  Posterior Calf Region  No depletion  Edema (RD Assessment)  None  Hair  Reviewed  Eyes  Unable to assess  Mouth  Unable to assess  Skin  Reviewed  Nails  Reviewed       Diet Order:  Diet NPO time specified  EDUCATION NEEDS:   No education needs have been identified at this time  Skin:  Skin Assessment: Skin Integrity Issues: Skin Integrity Issues:: Stage I Stage I: buttocks  Last BM:  unknown  Height:   Ht Readings from Last 1 Encounters:  08/19/17 6\' 2"  (1.88 m)    Weight:   Wt Readings from Last 1 Encounters:  08/19/17 177 lb 4 oz (80.4 kg)    Ideal Body Weight:  86.3 kg  BMI:  Body mass index is 22.76  kg/m.  Estimated Nutritional Needs:   Kcal:  7106  Protein:  105-115 grams  Fluid:  > 1.7 L/day  Maylon Peppers RD, LDN, CNSC [phone] Pager [phone] After Hours Pager

## 2017-08-19 NOTE — Progress Notes (Signed)
Patient ID: Thomas Hanna, male   DOB: [birth year], 84 y.o.   MRN: [id number] INR. 4 yr Rt handed  LSW 700pm  Acute onset of RT sided weakness and LT gaze preference. MRS 0 -1. CT BRAIN  No ICH. ASPECTS 10. CTA distal LT MCA occlusion. CTP CBF < 30 % 71 m.Tmx > 60 173 m. Mmch 152m. Mismatch ratio of 2.4  Option of endovascular revascularization of blocked artery to prevent further neurological injury D/W patients son and daughter in law.Procedure,reasons,risks and alternatives reviewed. Risk of ICH of 10 % with intervention,with worsening neurological deficit vent dependency and inability to revascularize were all reviewed.Questions were answered to their satisfaction.. Informed  witnessed consent was obtained for endovascular treatment under [place]. S.Natahlia Hoggard MD

## 2017-08-19 NOTE — Consult Note (Signed)
PULMONARY / CRITICAL CARE MEDICINE   Name: Thomas Hanna MRN: [id number] DOB: 1924-01-09    ADMISSION DATE:  08/18/2017 CONSULTATION DATE: August 19, 2017  REFERRING MD: Dr. Leonie Man  CHIEF COMPLAINT: Stroke  HISTORY OF PRESENT ILLNESS:   84 year old male with past medical history as below, which is significant for CAD, prostate cancer, and left thalamic stroke with mild residual right hemiparesis in January 2018.  He presented to [facility] emergency department on 1/7 with complaints of right-sided weakness, left-sided gaze preference, and aphasia.  Last known well at 1900 on 1/7.  He presented to the emergency department as a code stroke and was given IV TPA after noncontrast CT was negative for acute bleed.  Further cerebral imaging demonstrated left distal M1/proximal M2 occlusion.  He was taken to interventional radiology and underwent mechanical thrombolysis.  Post procedurally he was sent to the ICU for recovery and remained on the ventilator.  PCCM asked to consult.  PAST MEDICAL HISTORY :  He  has a past medical history of Coronary artery disease, MI (myocardial infarction) ([place]), Orthostatic hypotension (08/2016), Prostate cancer ([place]), and Stroke ([place]).  PAST SURGICAL HISTORY: He  has a past surgical history that includes Cardiac surgery; cardiac stents; and Coronary artery bypass graft.  No Known Allergies  No current facility-administered medications on file prior to encounter.    Current Outpatient Medications on File Prior to Encounter  Medication Sig  . cholecalciferol (VITAMIN D) 1000 units tablet Take 1,000 Units by mouth daily.  . clopidogrel (PLAVIX) 75 MG tablet Take 75 mg by mouth daily.  . dorzolamide (TRUSOPT) 2 % ophthalmic solution Place 1 drop into both eyes 2 (two) times daily.  Marland Kitchen FERROUS SULFATE PO Take by mouth.  . gabapentin (NEURONTIN) 100 MG capsule Take 600 mg by mouth 3 (three) times daily. Pt takes 600 mg in the morning and 300 mg at bedtime  .  latanoprost (XALATAN) 0.005 % ophthalmic solution Place 1 drop into both eyes at bedtime.   . midodrine (PROAMATINE) 5 MG tablet Take 0.5 tablets (2.5 mg total) by mouth 2 (two) times daily with a meal.  . tamsulosin (FLOMAX) 0.4 MG CAPS capsule Take 0.4 mg by mouth.  . vitamin B-12 (CYANOCOBALAMIN) 100 MCG tablet Take 100 mcg by mouth daily.    FAMILY HISTORY:  His indicated that his mother is deceased. He indicated that his father is deceased. He indicated that the status of his sister is unknown.   SOCIAL HISTORY: He  reports that he quit smoking about 49 years ago. he has never used smokeless tobacco. He reports that he does not drink alcohol or use drugs.  REVIEW OF SYSTEMS:   Unable as patient is encephalopathic and intubated  SUBJECTIVE:    VITAL SIGNS: BP (!) 168/111 (BP Location: Right Arm)   Pulse (!) 122   Temp 98.7 F (37.1 C) (Oral)   Resp 20   Wt 82.9 kg (182 lb 12.2 oz)   SpO2 98%   BMI 25.13 kg/m   HEMODYNAMICS:    VENTILATOR SETTINGS:    INTAKE / OUTPUT: No intake/output data recorded.  PHYSICAL EXAMINATION: General:  Elderly male in NAD on vent Neuro:  Sedated HEENT:  [place]/AT, PERRL, no JVD Cardiovascular:  IRIR, no MRG Lungs:  Clear Abdomen:  Soft, non-distended Musculoskeletal:  No acute deformity or ROM limitation Skin:  Grossly intact  LABS:  BMET Recent Labs  Lab 08/18/17 2151 08/18/17 2202  NA 135 139  K 3.8 3.9  CL 103  102  CO2 26  --   BUN 28* 27*  CREATININE 0.76 0.80  GLUCOSE 147* 142*    Electrolytes Recent Labs  Lab 08/18/17 2151  CALCIUM 8.9    CBC Recent Labs  Lab 08/18/17 2151 08/18/17 2202  WBC 10.5  --   HGB 11.5* 11.9*  HCT 35.9* 35.0*  PLT 209  --     Coag's Recent Labs  Lab 08/18/17 2151  APTT 30  INR 1.04    Sepsis Markers No results for input(s): LATICACIDVEN, PROCALCITON, O2SATVEN in the last 168 hours.  ABG No results for input(s): PHART, PCO2ART, PO2ART in the last 168  hours.  Liver Enzymes Recent Labs  Lab 08/18/17 2151  AST 23  ALT 22  ALKPHOS 65  BILITOT 0.8  ALBUMIN 4.0    Cardiac Enzymes No results for input(s): TROPONINI, PROBNP in the last 168 hours.  Glucose Recent Labs  Lab 08/18/17 2152  GLUCAP 143*    Imaging Ct Angio Head W Or Wo Contrast  Result Date: 08/18/2017 CLINICAL DATA:  84 y/o M; right-sided weakness and speech difficulty. EXAM: CT ANGIOGRAPHY HEAD AND NECK CT PERFUSION BRAIN TECHNIQUE: Multidetector CT imaging of the head and neck was performed using the standard protocol during bolus administration of intravenous contrast. Multiplanar CT image reconstructions and MIPs were obtained to evaluate the vascular anatomy. Carotid stenosis measurements (when applicable) are obtained utilizing NASCET criteria, using the distal internal carotid diameter as the denominator. Multiphase CT imaging of the brain was performed following IV bolus contrast injection. Subsequent parametric perfusion maps were calculated using RAPID software. CONTRAST:  130mL ISOVUE-370 IOPAMIDOL (ISOVUE-370) INJECTION 76% COMPARISON:  09/04/2016 CTA head. FINDINGS: CTA NECK FINDINGS Aortic arch: Bovine variant branching. Imaged portion shows no evidence of aneurysm or dissection. No significant stenosis of the major arch vessel origins. Moderate calcific atherosclerosis. Right carotid system: Dense calcified plaque of right carotid bifurcation with 60-70% proximal ICA stenosis. Left carotid system: Dense calcified plaque of left carotid bifurcation with 60-70% proximal ICA stenosis. \ Vertebral arteries: Calcified plaque of right vertebral artery origin with moderate 50-70% stenosis. Patent right vertebral artery. Skeleton: Mild cervical spondylosis. No high-grade bony canal stenosis per Other neck: Negative. Upper chest: Right greater than left apical pleuroparenchymal scarring. Review of the MIP images confirms the above findings CTA HEAD FINDINGS Anterior  circulation: Right paraclinoid and terminal ICA occlusion/near occlusion. Contrast opacification of the right upper cervical ICA diminishes gradually to the terminal segment of ICA suggesting a degree of patency, severe stenosis was present on the prior study, this may represent chronic severe stenosis with poor antegrade flow or interval thrombosis. Severe left paraclinoid ICA stenosis. Distal left M1 occlusion extending into left M2 superior division. Early reconstitution of left M2 inferior division via collateralization. Poor downstream collateralization in the superior left MCA distribution. Posterior circulation: No significant stenosis, proximal occlusion, aneurysm, or vascular malformation. Venous sinuses: As permitted by contrast timing, patent. Anatomic variants: Fetal right PCA. Patent anterior communicating artery. No left posterior communicating artery identified, likely hypoplastic or absent. Review of the MIP images confirms the above findings CT Brain Perfusion Findings: CBF (<30%) Volume: 71mL Perfusion (Tmax>6.0s) volume: 173mL Mismatch Volume: 181mL Infarction Location:Left superior MCA IMPRESSION: CTA neck: 1. Patent carotid and vertebral arteries of the neck. 2. Dense calcified plaque of bilateral carotid bifurcations with proximal ICA moderate 60-70% stenosis. 3. Moderate stenosis of right vertebral artery origin. CTA head: 1. Distal left M1 occlusion extending into left M2 superior division. Early reconstitution of left  M2 inferior division via collateralization. Poor downstream collateralization and left superior MCA distribution. 2. Left paraclinoid ICA severe stenosis. 3. Right paraclinoid and terminal ICA occlusion or near occlusion. Contrast opacification of the right upper cervical ICA diminishes gradually to the terminal segment, severe stenosis was present on the prior study. Findings may represent chronic severe stenosis with poor antegrade flow or interval thrombosis. CT brain  perfusion: 1. Core infarct volume of 71 cc. 2. Mismatch ischemic penumbra of 102 cc. 3. Infarct location left superior MCA distribution. These results were called by telephone at the time of interpretation on 08/18/2017 at 10:38 pm to Dr. Amie Portland , who verbally acknowledged these results. Electronically Signed   By: Kristine Garbe M.D.   On: 08/18/2017 22:54   Ct Angio Neck W Or Wo Contrast  Result Date: 08/18/2017 CLINICAL DATA:  84 y/o M; right-sided weakness and speech difficulty. EXAM: CT ANGIOGRAPHY HEAD AND NECK CT PERFUSION BRAIN TECHNIQUE: Multidetector CT imaging of the head and neck was performed using the standard protocol during bolus administration of intravenous contrast. Multiplanar CT image reconstructions and MIPs were obtained to evaluate the vascular anatomy. Carotid stenosis measurements (when applicable) are obtained utilizing NASCET criteria, using the distal internal carotid diameter as the denominator. Multiphase CT imaging of the brain was performed following IV bolus contrast injection. Subsequent parametric perfusion maps were calculated using RAPID software. CONTRAST:  137mL ISOVUE-370 IOPAMIDOL (ISOVUE-370) INJECTION 76% COMPARISON:  09/04/2016 CTA head. FINDINGS: CTA NECK FINDINGS Aortic arch: Bovine variant branching. Imaged portion shows no evidence of aneurysm or dissection. No significant stenosis of the major arch vessel origins. Moderate calcific atherosclerosis. Right carotid system: Dense calcified plaque of right carotid bifurcation with 60-70% proximal ICA stenosis. Left carotid system: Dense calcified plaque of left carotid bifurcation with 60-70% proximal ICA stenosis. \ Vertebral arteries: Calcified plaque of right vertebral artery origin with moderate 50-70% stenosis. Patent right vertebral artery. Skeleton: Mild cervical spondylosis. No high-grade bony canal stenosis per Other neck: Negative. Upper chest: Right greater than left apical pleuroparenchymal  scarring. Review of the MIP images confirms the above findings CTA HEAD FINDINGS Anterior circulation: Right paraclinoid and terminal ICA occlusion/near occlusion. Contrast opacification of the right upper cervical ICA diminishes gradually to the terminal segment of ICA suggesting a degree of patency, severe stenosis was present on the prior study, this may represent chronic severe stenosis with poor antegrade flow or interval thrombosis. Severe left paraclinoid ICA stenosis. Distal left M1 occlusion extending into left M2 superior division. Early reconstitution of left M2 inferior division via collateralization. Poor downstream collateralization in the superior left MCA distribution. Posterior circulation: No significant stenosis, proximal occlusion, aneurysm, or vascular malformation. Venous sinuses: As permitted by contrast timing, patent. Anatomic variants: Fetal right PCA. Patent anterior communicating artery. No left posterior communicating artery identified, likely hypoplastic or absent. Review of the MIP images confirms the above findings CT Brain Perfusion Findings: CBF (<30%) Volume: 32mL Perfusion (Tmax>6.0s) volume: 153mL Mismatch Volume: 168mL Infarction Location:Left superior MCA IMPRESSION: CTA neck: 1. Patent carotid and vertebral arteries of the neck. 2. Dense calcified plaque of bilateral carotid bifurcations with proximal ICA moderate 60-70% stenosis. 3. Moderate stenosis of right vertebral artery origin. CTA head: 1. Distal left M1 occlusion extending into left M2 superior division. Early reconstitution of left M2 inferior division via collateralization. Poor downstream collateralization and left superior MCA distribution. 2. Left paraclinoid ICA severe stenosis. 3. Right paraclinoid and terminal ICA occlusion or near occlusion. Contrast opacification of the right upper cervical  ICA diminishes gradually to the terminal segment, severe stenosis was present on the prior study. Findings may  represent chronic severe stenosis with poor antegrade flow or interval thrombosis. CT brain perfusion: 1. Core infarct volume of 71 cc. 2. Mismatch ischemic penumbra of 102 cc. 3. Infarct location left superior MCA distribution. These results were called by telephone at the time of interpretation on 08/18/2017 at 10:38 pm to Dr. Amie Portland , who verbally acknowledged these results. Electronically Signed   By: Kristine Garbe M.D.   On: 08/18/2017 22:54   Ct Cerebral Perfusion W Contrast  Result Date: 08/18/2017 CLINICAL DATA:  84 y/o M; right-sided weakness and speech difficulty. EXAM: CT ANGIOGRAPHY HEAD AND NECK CT PERFUSION BRAIN TECHNIQUE: Multidetector CT imaging of the head and neck was performed using the standard protocol during bolus administration of intravenous contrast. Multiplanar CT image reconstructions and MIPs were obtained to evaluate the vascular anatomy. Carotid stenosis measurements (when applicable) are obtained utilizing NASCET criteria, using the distal internal carotid diameter as the denominator. Multiphase CT imaging of the brain was performed following IV bolus contrast injection. Subsequent parametric perfusion maps were calculated using RAPID software. CONTRAST:  142mL ISOVUE-370 IOPAMIDOL (ISOVUE-370) INJECTION 76% COMPARISON:  09/04/2016 CTA head. FINDINGS: CTA NECK FINDINGS Aortic arch: Bovine variant branching. Imaged portion shows no evidence of aneurysm or dissection. No significant stenosis of the major arch vessel origins. Moderate calcific atherosclerosis. Right carotid system: Dense calcified plaque of right carotid bifurcation with 60-70% proximal ICA stenosis. Left carotid system: Dense calcified plaque of left carotid bifurcation with 60-70% proximal ICA stenosis. \ Vertebral arteries: Calcified plaque of right vertebral artery origin with moderate 50-70% stenosis. Patent right vertebral artery. Skeleton: Mild cervical spondylosis. No high-grade bony canal  stenosis per Other neck: Negative. Upper chest: Right greater than left apical pleuroparenchymal scarring. Review of the MIP images confirms the above findings CTA HEAD FINDINGS Anterior circulation: Right paraclinoid and terminal ICA occlusion/near occlusion. Contrast opacification of the right upper cervical ICA diminishes gradually to the terminal segment of ICA suggesting a degree of patency, severe stenosis was present on the prior study, this may represent chronic severe stenosis with poor antegrade flow or interval thrombosis. Severe left paraclinoid ICA stenosis. Distal left M1 occlusion extending into left M2 superior division. Early reconstitution of left M2 inferior division via collateralization. Poor downstream collateralization in the superior left MCA distribution. Posterior circulation: No significant stenosis, proximal occlusion, aneurysm, or vascular malformation. Venous sinuses: As permitted by contrast timing, patent. Anatomic variants: Fetal right PCA. Patent anterior communicating artery. No left posterior communicating artery identified, likely hypoplastic or absent. Review of the MIP images confirms the above findings CT Brain Perfusion Findings: CBF (<30%) Volume: 68mL Perfusion (Tmax>6.0s) volume: 127mL Mismatch Volume: 147mL Infarction Location:Left superior MCA IMPRESSION: CTA neck: 1. Patent carotid and vertebral arteries of the neck. 2. Dense calcified plaque of bilateral carotid bifurcations with proximal ICA moderate 60-70% stenosis. 3. Moderate stenosis of right vertebral artery origin. CTA head: 1. Distal left M1 occlusion extending into left M2 superior division. Early reconstitution of left M2 inferior division via collateralization. Poor downstream collateralization and left superior MCA distribution. 2. Left paraclinoid ICA severe stenosis. 3. Right paraclinoid and terminal ICA occlusion or near occlusion. Contrast opacification of the right upper cervical ICA diminishes  gradually to the terminal segment, severe stenosis was present on the prior study. Findings may represent chronic severe stenosis with poor antegrade flow or interval thrombosis. CT brain perfusion: 1. Core infarct volume of 71  cc. 2. Mismatch ischemic penumbra of 102 cc. 3. Infarct location left superior MCA distribution. These results were called by telephone at the time of interpretation on 08/18/2017 at 10:38 pm to Dr. Amie Portland , who verbally acknowledged these results. Electronically Signed   By: Kristine Garbe M.D.   On: 08/18/2017 22:54   Ct Head Code Stroke Wo Contrast  Result Date: 08/18/2017 CLINICAL DATA:  Code stroke. 84 y/o M; right-sided weakness and speech difficulty. EXAM: CT HEAD WITHOUT CONTRAST TECHNIQUE: Contiguous axial images were obtained from the base of the skull through the vertex without intravenous contrast. COMPARISON:  09/04/2016 CT angiogram head.  12/07/2016 MRI head. FINDINGS: Brain: No evidence of acute infarction, hemorrhage, hydrocephalus, extra-axial collection or mass lesion/mass effect. Stable background of chronic microvascular ischemic changes and brain parenchymal volume loss. Small chronic infarct within left thalamus. Vascular: Calcific atherosclerosis of carotid siphons. Density within left M2 superior division (series 6, image 47). Skull: Normal. Negative for fracture or focal lesion. Sinuses/Orbits: No acute finding. Other: Bilateral intra-ocular lens replacement. ASPECTS [facility] Stroke Program Early CT Score) - Ganglionic level infarction (caudate, lentiform nuclei, internal capsule, insula, M1-M3 cortex): 7 - Supraganglionic infarction (M4-M6 cortex): 3 Total score (0-10 with 10 being normal): 10 IMPRESSION: 1. No acute stroke, hemorrhage, or mass effect identified. 2. Density in left M2 superior division, possible thrombus. 3. ASPECTS is 10 4. Stable chronic microvascular ischemic changes and parenchymal volume loss of the brain. These results were  called by telephone at the time of interpretation on 08/18/2017 at 10:11 pm to Dr. Carmin Muskrat , who verbally acknowledged these results. Electronically Signed   By: Kristine Garbe M.D.   On: 08/18/2017 22:11     STUDIES:  CT head code stroke 1/7> no acute stroke, hemorrhage, or mass-effect identified.  Density in the left M2 superior division. CT Angie of head 1/7> distal left M1 occlusion extending into the left M2 superior division.  Bilateral paraclinoid ICA stenosis with near occlusion on the right.  CULTURES:   ANTIBIOTICS: Perioperative cefazolin   SIGNIFICANT EVENTS: 1/7 presented with acute stroke, IV TPA given 1/8 taken to IR for mechanical thrombolyzes.  Recovered in ICU on vent  LINES/TUBES: Endotracheal tube 1/8>  DISCUSSION: 84 year old male presented as code stroke found to have Left MCA CVA. Taken to IR for revascularization. Post-op remained on vent. PCCM asked to see.   ASSESSMENT / PLAN:  PULMONARY A: Inability to protect airway in the postoperative setting  P:   Full vent support Spontaneous breathing trial after sheath removed an MRI completed Ventilator associated pneumonia prevention bundle Chest x-ray to confirm tube placement ABG  CARDIOVASCULAR A:  Hypertension Atrial fibrillation - no documented history perhaps new onset.  Coronary artery disease Internal carotid artery stenosis  P:  ICU hemodynamic monitoring Nicardipine infusion  BP goals per Dr. Doristine Bosworth team Holding home midodrine Will need heparinization once safe considering TPA.   RENAL A:   No acute issues  P:   Monitor renal function considering contrast load.   GASTROINTESTINAL A:   No acute issues  P:   NPO IV protonix while intubated for SUP  HEMATOLOGIC A:   Mild anemia, at baseline  P:  Follow CBC Antiplatelet therapy per stroke team.    INFECTIOUS A:   Periop antibiotics   P:   Cefazolin  ENDOCRINE A:    No acute  issues  P:   Follow glucose on BMP  NEUROLOGIC A:   Acute CVA Left MCA  occlusion.  S/p IR revascularization 1/8  P:   RASS goal: -1 to -2 Propofol infusion for sedation PRN fentanyl for analgesia.  Management per stroke service.    FAMILY  - Updates: no family present  - Inter-disciplinary family meet or Palliative Care meeting due by:  1/14   Georgann Housekeeper, AGACNP-BC [place] [facility] Pager [phone] or [phone]  08/19/2017 2:20 AM

## 2017-08-19 NOTE — Progress Notes (Signed)
SLP Cancellation Note  Patient Details Name: Endre Coutts MRN: [id number] DOB: 28-Dec-1923   Cancelled treatment:       Reason Eval/Treat Not Completed: Patient not medically ready/remains intubated.   Juan Quam Laurice 08/19/2017, 11:55 AM

## 2017-08-20 DIAGNOSIS — D649 Anemia, unspecified: Secondary | ICD-10-CM

## 2017-08-20 DIAGNOSIS — I6523 Occlusion and stenosis of bilateral carotid arteries: Secondary | ICD-10-CM | POA: Diagnosis present

## 2017-08-20 LAB — GLUCOSE, CAPILLARY
GLUCOSE-CAPILLARY: 116 mg/dL — AB (ref 65–99)
GLUCOSE-CAPILLARY: 132 mg/dL — AB (ref 65–99)
Glucose-Capillary: 112 mg/dL — ABNORMAL HIGH (ref 65–99)

## 2017-08-20 LAB — CBC
HCT: 31.4 % — ABNORMAL LOW (ref 39.0–52.0)
Hemoglobin: 9.9 g/dL — ABNORMAL LOW (ref 13.0–17.0)
MCH: 28.8 pg (ref 26.0–34.0)
MCHC: 31.5 g/dL (ref 30.0–36.0)
MCV: 91.3 fL (ref 78.0–100.0)
PLATELETS: 194 10*3/uL (ref 150–400)
RBC: 3.44 MIL/uL — ABNORMAL LOW (ref 4.22–5.81)
RDW: 14 % (ref 11.5–15.5)
WBC: 12.7 10*3/uL — ABNORMAL HIGH (ref 4.0–10.5)

## 2017-08-20 LAB — BASIC METABOLIC PANEL
Anion gap: 8 (ref 5–15)
BUN: 18 mg/dL (ref 6–20)
CHLORIDE: 109 mmol/L (ref 101–111)
CO2: 20 mmol/L — ABNORMAL LOW (ref 22–32)
CREATININE: 0.7 mg/dL (ref 0.61–1.24)
Calcium: 7.9 mg/dL — ABNORMAL LOW (ref 8.9–10.3)
GFR calc Af Amer: >60 mL/min (ref >60)
Glucose, Bld: 110 mg/dL — ABNORMAL HIGH (ref 65–99)
Potassium: 3.5 mmol/L (ref 3.5–5.1)
SODIUM: 137 mmol/L (ref 135–145)

## 2017-08-20 LAB — PHOSPHORUS
PHOSPHORUS: 3.2 mg/dL (ref 2.5–4.6)
Phosphorus: 2.7 mg/dL (ref 2.5–4.6)

## 2017-08-20 LAB — MAGNESIUM
MAGNESIUM: 1.8 mg/dL (ref 1.7–2.4)
Magnesium: 1.9 mg/dL (ref 1.7–2.4)

## 2017-08-20 MED ORDER — VITAL AF 1.2 CAL PO LIQD
1000.0000 mL | ORAL | Status: DC
  Administered 2017-08-20: 1000 mL

## 2017-08-20 NOTE — Progress Notes (Signed)
PULMONARY / CRITICAL CARE MEDICINE   Name: Thomas Hanna MRN: [id number] DOB: [birth year]    ADMISSION DATE:  08/18/2017 CONSULTATION DATE: August 19, 2017  REFERRING MD: Dr. Leonie Hanna  CHIEF COMPLAINT: Stroke  HISTORY OF PRESENT ILLNESS:   This 84 year old male is seen in consultation at the request of Dr. Shearon Hanna previous left thalamic stroke with mild residual right hemiparesis(January 2018) presented to [facility] emergency department on 1/7 with complaints of right-sided weakness, left-sided gaze preference, and aphasia.  Last known well at 1900 on 1/7.  He presented to the emergency department as a code stroke and was given IV tPA after noncontrast CT was negative for acute bleed.  Further cerebral imaging demonstrated left distal M1/proximal M2 occlusion.  He was taken to interventional radiology and underwent mechanical thrombolysis.  Post procedurally he was sent to the ICU for recovery and remained on the ventilator.  PCCM asked to consult.  PAST MEDICAL HISTORY :  He  has a past medical history of Coronary artery disease, MI (myocardial infarction) (Thomas Hanna), Orthostatic hypotension (08/2016), Prostate cancer (Thomas Hanna), and Stroke (Thomas Hanna).  PAST SURGICAL HISTORY: He  has a past surgical history that includes Cardiac surgery; cardiac stents; Coronary artery bypass graft; and Radiology with anesthesia (N/A, 08/18/2017).  Allergies  Allergen Reactions  . Sulfa Antibiotics Other (See Comments)    Unknown    No current facility-administered medications on file prior to encounter.    Current Outpatient Medications on File Prior to Encounter  Medication Sig  . Ascorbic Acid (VITAMIN C) 100 MG tablet Take 100 mg by mouth daily.  . cholecalciferol (VITAMIN D) 1000 units tablet Take 1,000 Units by mouth daily.  . clopidogrel (PLAVIX) 75 MG tablet Take 75 mg by mouth daily.  . dorzolamide (TRUSOPT) 2 % ophthalmic solution Place 1 drop into both eyes 2 (two) times daily.  . ferrous sulfate 325 (65 FE)  MG tablet Take 325 mg by mouth every morning.   . folic acid (FOLVITE) 962 MCG tablet Take 400 mcg by mouth daily.  Marland Kitchen gabapentin (NEURONTIN) 100 MG capsule Take 300-600 mg by mouth See admin instructions. Pt takes 600 mg in the morning and noon and 300 mg at bedtime  . Ginkgo Biloba (GINKOBA PO) Take 1 capsule by mouth daily.  Marland Kitchen latanoprost (XALATAN) 0.005 % ophthalmic solution Place 1 drop into both eyes at bedtime.   . midodrine (PROAMATINE) 5 MG tablet Take 0.5 tablets (2.5 mg total) by mouth 2 (two) times daily with a meal.  . omega-3 acid ethyl esters (LOVAZA) 1 g capsule Take 1 g by mouth 2 (two) times daily.  . tamsulosin (FLOMAX) 0.4 MG CAPS capsule Take 0.4 mg by mouth daily after supper.   . vitamin B-12 (CYANOCOBALAMIN) 100 MCG tablet Take 100 mcg by mouth daily.    FAMILY HISTORY:  His indicated that his mother is deceased. He indicated that his father is deceased. He indicated that the status of his sister is unknown.   SOCIAL HISTORY: He  reports that he quit smoking about 49 years ago. he has never used smokeless tobacco. He reports that he does not drink alcohol or use drugs.  REVIEW OF SYSTEMS:   Unable as patient is encephalopathic and intubated  SUBJECTIVE:    VITAL SIGNS: BP 133/62   Pulse 85   Temp 97.8 F (36.6 C) (Axillary)   Resp 17   Ht 6\' 2"  (1.88 m)   Wt 80.4 kg (177 lb 4 oz)   SpO2 100%  BMI 22.76 kg/m   HEMODYNAMICS:    VENTILATOR SETTINGS: Vent Mode: PRVC FiO2 (%):  [30 %-100 %] 40 % Set Rate:  [16 bmp] 16 bmp Vt Set:  [650 mL] 650 mL PEEP:  [5 cmH20] 5 cmH20 Pressure Support:  [12 cmH20] 12 cmH20 Plateau Pressure:  [16 cmH20-19 cmH20] 18 cmH20  INTAKE / OUTPUT: I/O last 3 completed shifts: In: 3830.6 [I.V.:3830.6] Out: 2015 [Urine:1765; Emesis/NG output:150; Blood:100]  PHYSICAL EXAMINATION: General:  Intubated and sedated. Neuro:  Sedated HEENT:  Thomas Hanna/AT, PERRL, no JVD Cardiovascular:  IRIR, no MRG Lungs:  Clear Abdomen:  Soft,  non-distended Musculoskeletal:  No acute deformity or ROM limitation Skin:  Grossly intact  LABS:  BMET Recent Labs  Lab 08/18/17 2151 08/18/17 2202 08/19/17 0304 08/20/17 0522  NA 135 139 136 137  K 3.8 3.9 3.6 3.5  CL 103 102 106 109  CO2 26  --  21* 20*  BUN 28* 27* 24* 18  CREATININE 0.76 0.80 0.71 0.70  GLUCOSE 147* 142* 135* 110*    Electrolytes Recent Labs  Lab 08/18/17 2151 08/19/17 0304 08/20/17 0522  CALCIUM 8.9 8.1* 7.9*  MG  --   --  1.8  PHOS  --   --  2.7    CBC Recent Labs  Lab 08/19/17 0304 08/19/17 0926 08/20/17 0522  WBC 11.4* 12.6* 12.7*  HGB 9.6* 9.9* 9.9*  HCT 30.1* 31.2* 31.4*  PLT 199 207 194    Coag's Recent Labs  Lab 08/18/17 2151  APTT 30  INR 1.04    Sepsis Markers No results for input(s): LATICACIDVEN, PROCALCITON, O2SATVEN in the last 168 hours.  ABG Recent Labs  Lab 08/19/17 0345  PHART 7.437  PCO2ART 33.7  PO2ART 406*    Liver Enzymes Recent Labs  Lab 08/18/17 2151  AST 23  ALT 22  ALKPHOS 65  BILITOT 0.8  ALBUMIN 4.0    Cardiac Enzymes No results for input(s): TROPONINI, PROBNP in the last 168 hours.  Glucose Recent Labs  Lab 08/18/17 2152  GLUCAP 143*    Imaging [name]  Result Date: 08/19/2017 CLINICAL DATA:  Follow-up examination for acute stroke. Status post tPA with catheter directed thrombectomy. EXAM: MRI HEAD WITHOUT CONTRAST TECHNIQUE: Multiplanar, multiecho pulse sequences of the brain and surrounding structures were obtained without intravenous contrast. COMPARISON:  Prior CTs from earlier the same day as well as 08/18/2017. FINDINGS: Brain: Cerebral volume normal. Mild chronic microvascular ischemic changes present within the periventricular white matter. Patchy multifocal left MCA territory infarcts seen involving the left frontal and parietal lobes, with involvement of the left insula and left frontal operculum. Patchy involvement within the left periatrial white matter.  Confluent restricted diffusion at the parasagittal left frontal parietal region consistent with left ACA territory infarct and/or watershed infarct. There are additional patchy multifocal cortical infarcts involving the right parietal lobe and posterior insular region. Patchy infarct within the right periatrial white matter. Small 1 cm acute left cerebellar infarct noted as well. No associated mass effect. Associated petechial hemorrhage at the level of the left frontal operculum without frank hemorrhagic transformation (series 8, image 17). No mass lesion, midline shift, or mass effect. No hydrocephalus. No extra-axial fluid collection. Pituitary gland suprasellar region normal. Midline structures intact and normal. Vascular: Major intracranial vascular flow voids are maintained at the skull base. Skull and upper cervical spine: Craniocervical junction normal. Upper cervical spine normal. Bone marrow signal intensity within normal limits. No scalp soft tissue abnormality. Sinuses/Orbits: Globes and  orbital soft tissues demonstrate no acute abnormality. Patient status post cataract extraction bilaterally. Subtle asymmetric thickening at the left posterolateral sclerotic noted, which may be postoperative in nature. Scattered mucosal thickening throughout the paranasal sinuses. No air-fluid level to suggest acute sinusitis. Mastoid air cells are largely clear. Inner ear structures normal. Other: None IMPRESSION: 1. Patchy multifocal acute ischemic infarcts involving the bilateral cerebral hemispheres, with additional 1 cm left cerebellar infarct as above. Associated petechial hemorrhage at the left frontal operculum without hemorrhagic transformation. 2. Underlying mild chronic microvascular ischemic disease. Electronically Signed   By: Jeannine Boga M.D.   On: 08/19/2017 17:17     STUDIES:  CT head code stroke 1/7> no acute stroke, hemorrhage, or mass-effect identified.  Density in the left M2 superior  division. CT Angie of head 1/7> distal left M1 occlusion extending into the left M2 superior division.  Bilateral paraclinoid ICA stenosis with near occlusion on the right.  CULTURES: Results for orders placed or performed during the hospital encounter of 08/18/17  MRSA PCR Screening     Status: None   Collection Time: 08/19/17  2:33 AM  Result Value Ref Range Status   MRSA by PCR NEGATIVE NEGATIVE Final    Comment:        The GeneXpert MRSA Assay (FDA approved for NASAL specimens only), is one component of a comprehensive MRSA colonization surveillance program. It is not intended to diagnose MRSA infection nor to guide or monitor treatment for MRSA infections.      ANTIBIOTICS: Perioperative cefazolin   SIGNIFICANT EVENTS: 1/7 presented with acute stroke, IV TPA given 1/8 taken to IR for mechanical thrombolyzes.  Recovered in ICU on vent  LINES/TUBES: Endotracheal tube 1/8>  DISCUSSION: 84 year old male presented as code stroke found to have Left MCA CVA. Taken to IR for revascularization. Post-op remained on vent. PCCM asked to see.   ASSESSMENT / PLAN:  PULMONARY A: Inability to protect airway in the postoperative setting  P:   Trial of SIMV + PSV. Discussed with RT. Ventilator associated pneumonia prevention bundle CXR in am ABG in am  CARDIOVASCULAR A:  Hypertension Atrial fibrillation - no documented history perhaps new onset.  Coronary artery disease Internal carotid artery stenosis  P:  ICU hemodynamic monitoring Nicardipine infusion  BP goals per Dr. Doristine Bosworth team (SBP 120-140) Holding home midodrine Heparin for DVT prophylaxis now. Will need heparinization once safe considering tPA.   RENAL A:   No acute issues  P:   Monitor renal function considering contrast load.   GASTROINTESTINAL A:   No acute issues  P: GI prophylaxis with Protonix  HEMATOLOGIC A:   Mild anemia, at baseline  P:  Follow CBC Antiplatelet therapy per  stroke team.    INFECTIOUS A:   Periop antibiotics   P:   Cefazolin  ENDOCRINE A:    No acute issues  P:   Follow glucose on BMP  NEUROLOGIC A:   Acute CVA Left MCA occlusion.  S/p IR revascularization 1/8  P:   RASS goal: -1 to -2 Propofol infusion for sedation PRN fentanyl for analgesia.  Case discussed with Dr. Leonie Hanna. Change in CODE STATUS to DNR noted.   FAMILY  - Updates: no family present  - Inter-disciplinary family meet or Palliative Care meeting due by:  1/14   Renee Pain, MD [facility] Pulmonology/Critical Care Pager [phone] or [phone]  08/20/2017 10:54 AM

## 2017-08-20 NOTE — Progress Notes (Signed)
Initial Nutrition Assessment  INTERVENTION:   Initiate  Vital AF 1.2 @ 60 ml/hr (1440 ml/day) Provides: 1728 kcal, 108 grams protein, and 1167 ml free water.    NUTRITION DIAGNOSIS:   Inadequate oral intake related to inability to eat as evidenced by NPO status.  GOAL:   Patient will meet greater than or equal to 90% of their needs  MONITOR:   Vent status, I & O's  REASON FOR ASSESSMENT:   Consult Enteral/tube feeding initiation and management  ASSESSMENT:   Pt with PMH of CAD, prostate Ca, CVA admitted with new afib, ICH s/p tPA and thrombolysis.    Pt discussed during ICU rounds and with RN.  Per RN plan to have family discussion   Patient is currently intubated on ventilator support MV: 10 L/min Temp (24hrs), Avg:98.2 F (36.8 C), Min:97.5 F (36.4 C), Max:99.6 F (37.6 C)  Medications and labs reviewed   Neo-synephrine infusing but dose decreasing   Diet Order:  Diet NPO time specified  EDUCATION NEEDS:   No education needs have been identified at this time  Skin:  Skin Assessment: Skin Integrity Issues: Skin Integrity Issues:: Stage I Stage I: buttocks  Last BM:  unknown  Height:   Ht Readings from Last 1 Encounters:  08/19/17 6\' 2"  (1.88 m)    Weight:   Wt Readings from Last 1 Encounters:  08/19/17 177 lb 4 oz (80.4 kg)    Ideal Body Weight:  86.3 kg  BMI:  Body mass index is 22.76 kg/m.  Estimated Nutritional Needs:   Kcal:  6256  Protein:  105-115 grams  Fluid:  > 1.7 L/day  Maylon Peppers RD, LDN, CNSC [phone] Pager [phone] After Hours Pager

## 2017-08-20 NOTE — Progress Notes (Signed)
NEUROHOSPITALISTS STROKE TEAM - DAILY PROGRESS NOTE   ADMISSION HISTORY: HPI: Thomas Hanna is a 84 y.o. male was a past medical history of coronary artery disease, orthostatic hypotension, prostate cancer, left thalamic stroke with mild residual right hemiparesis since January 2018, who presented to the emergency room as an acute code stroke for sudden onset of right-sided weakness, left-sided gaze preference and a aphasia. But he lives with family.  He was last known normal at 1900 hrs. on 08/18/2017 as witnessed by his son.  Upon checking on him later on that day, he was noted to be completely mute and nonverbal.  He was not able to follow commands.  He had a gaze preference to the left and could not move his right side of the body.  EMS was called.  They assessed him on side and brought him in as an acute code stroke.  Upon initial evaluation performed by me, he was an NIH stroke scale of greater than 20. His symptoms are consistent with a complete left MCA syndrome.  Noncontrast CT of the head was negative for any acute bleed.  He was given IV TPA.  CT angiogram of the head and neck and CT perfusion study were performed which were favorable for intervention for the left distal M1/proximal M2 occlusion.  Neuro endovascular consultation was obtained and the endovascular team was called and he was taken in for thrombectomy.  He is on no blood thinners.  He has had no recent surgeries.  He has no recent strokes.  Son was called over the phone and all the history was obtained from him as much as possible during the acute situation over the phone.  Son and another family member was then met at bedside prior to getting patient in for the endovascular procedure.  They were appraised of the situation and all questions were answered.  Of note, patient was hypertensive with systolic blood pressure in the 962I and diastolics over 297 on arrival.  Mild delays  in administering IV TPA due to blood pressure being out of parameters and use of IV labetalol to control blood pressures.  LKW: 1900 hrs. on 08/18/2017 tpa given?:  Yes Premorbid modified Rankin scale (mRS): 1 NIHSS 1a Level of Conscious.: 0 1b LOC Questions: 2 1c LOC Commands: 2 2 Best Gaze: 1 3 Visual: 2 4 Facial Palsy: 2 5a Motor Arm - left: 0 5b Motor Arm - Right: 4 6a Motor Leg - Left: 2 6b Motor Leg - Right: 3 7 Limb Ataxia: 0 8 Sensory: 2 9 Best Language: 3 10 Dysarthria: 2 11 Extinct. and Inatten.: 2 TOTAL: 27  SUBJECTIVE (INTERVAL HISTORY)  His son and daughter in law are  at the bedside. Patient is found laying in bed in NAD, intubated and sedated.  MRI shows large patchy left MCA as well as embolic right MCA and left cerebellar infarcts   OBJECTIVE Lab Results: CBC:  Recent Labs  Lab 08/19/17 0304 08/19/17 0926 08/20/17 0522  WBC 11.4* 12.6* 12.7*  HGB 9.6* 9.9* 9.9*  HCT 30.1* 31.2* 31.4*  MCV 91.5 90.4 91.3  PLT 199 207 194   BMP: Recent Labs  Lab 08/18/17 2151 08/18/17 2202 08/19/17  0304 08/20/17 0522  NA 135 139 136 137  K 3.8 3.9 3.6 3.5  CL 103 102 106 109  CO2 26  --  21* 20*  GLUCOSE 147* 142* 135* 110*  BUN 28* 27* 24* 18  CREATININE 0.76 0.80 0.71 0.70  CALCIUM 8.9  --  8.1* 7.9*  MG  --   --   --  1.8  PHOS  --   --   --  2.7   Liver Function Tests:  Recent Labs  Lab 08/18/17 2151  AST 23  ALT 22  ALKPHOS 65  BILITOT 0.8  PROT 6.7  ALBUMIN 4.0   Coagulation Studies:  Recent Labs    08/18/17 2151  APTT 30  INR 1.04   PHYSICAL EXAM Temp:  [97.5 F (36.4 C)-99.6 F (37.6 C)] 97.5 F (36.4 C) (01/09 1200) Pulse Rate:  [39-141] 85 (01/09 1200) Resp:  [12-28] 12 (01/09 1200) BP: (87-179)/(52-99) 132/71 (01/09 1200) SpO2:  [97 %-100 %] 100 % (01/09 1200) Arterial Line BP: (98-201)/(42-108) 139/63 (01/09 1200) FiO2 (%):  [30 %-100 %] 40 % (01/09 1134) General - Well nourished, well developed, in no apparent distress,  Intubated and sedated HEENT-  Normocephalic, Normal external eye/conjunctiva.  Normal external ears. Normal external nose, mucus membranes and septum.   Cardiovascular - Regular rate and rhythm  Respiratory - Lungs clear bilaterally. No wheezing. Abdomen - soft and non-tender, BS normal Extremities- no edema or cyanosis NEURO:  Mental Status: Intubated and sedated. Will open eyes brief when name called loudly. Aphasic. Does not follow any commands. Cranial Nerves: PERRL gaze preference to the left, slight hypertropia of both eyes unable to cross the midline,, visual fields.  Right homonymous hemianopsia as evidenced by no blink to threat from that site,, right lower facial weakness Motor: 0/5 right upper extremity, minimal withdrawal to noxious stimulus in the right lower extremity, 5/5 left upper extremity with no drift, no drift in the left lower extremity but unable to hold above the bed secondary to inability to follow commands. Tone: is normal and bulk is normal Sensation-grossly reduced sensation on the right hemibody to noxious edematous. Coordination: Unable to test Gait- deferred  IMAGING: I have personally reviewed the radiological images below and agree with the radiology interpretations.  Ct Head Wo Contrast Result Date: 08/19/2017 IMPRESSION: Loss of gray-white differentiation within the insula and frontal operculum compatible with evolving acute infarction. No additional area of infarction identified at this time. No intracranial hemorrhage or mass effect.  CTA Head/Neck and Cerebral Perfusion W Contrast Result Date: 08/18/2017 IMPRESSION: CTA neck: 1. Patent carotid and vertebral arteries of the neck. 2. Dense calcified plaque of bilateral carotid bifurcations with proximal ICA moderate 60-70% stenosis. 3. Moderate stenosis of right vertebral artery origin. CTA head: 1. Distal left M1 occlusion extending into left M2 superior division. Early reconstitution of left M2 inferior  division via collateralization. Poor downstream collateralization and left superior MCA distribution. 2. Left paraclinoid ICA severe stenosis. 3. Right paraclinoid and terminal ICA occlusion or near occlusion. Contrast opacification of the right upper cervical ICA diminishes gradually to the terminal segment, severe stenosis was present on the prior study. Findings may represent chronic severe stenosis with poor antegrade flow or interval thrombosis. CT brain perfusion: 1. Core infarct volume of 71 cc. 2. Mismatch ischemic penumbra of 102 cc. 3. Infarct location left superior MCA distribution.   Portable Chest Xray Result Date: 08/19/2017 IMPRESSION: Endotracheal tube 4.8 cm from carina. Aortic atherosclerosis. No acute pulmonary process  identified.   Dg Abd Portable 1v Result Date: 08/19/2017 IMPRESSION: The esophagogastric tube tip and proximal port of appear to lie in the stomach.   Ct Head Code Stroke Wo Contrast Result Date: 08/18/2017 IMPRESSION: 1. No acute stroke, hemorrhage, or mass effect identified. 2. Density in left M2 superior division, possible thrombus. 3. ASPECTS is 10 4. Stable chronic microvascular ischemic changes and parenchymal volume loss of the brain.   Echocardiogram:                                               Study Conclusions - Left ventricle: Posterior basal hypokinesis. Wall thickness was   increased in a pattern of mild LVH. The estimated ejection   fraction was 55%. The study is not technically sufficient to   allow evaluation of LV diastolic function. - Mitral valve: Moderately calcified annulus. Moderately thickened,   moderately calcified leaflets . There was mild regurgitation. - Left atrium: The atrium was mildly dilated. - Atrial septum: No defect or patent foramen ovale was identified.   MRI BRAIN:                                                PENDING    IMPRESSION: Mr. Nole Robey is a 84 y.o. male with PMH of coronary artery disease,  orthostatic hypotension, prostate cancer, left thalamic stroke with mild residual right hemiparesis since January 2018, who presented to the emergency room as an acute code stroke for sudden onset of right-sided weakness, left-sided gaze preference and a aphasia with an NIH stroke scale of 27. Noncontrast CT of the head did not show any bleed.  IV TPA was administered after reviewing contraindications with the son over the phone. Head and neck vessel imaging reveals left M1/M2 superior division occlusion.  Deemed to be a good candidate for endovascular treatment given less than 6 hours from presentation and a favorable perfusion profile on the CT perfusion as well. Neuro endovascular was consulted and the patient was taken for IAT.   Acute ischemic strokes in multiple vascular distributions status post IV TPA and mechanical thrombectomy Suspected Etiology: likely cardio embolic given new onset AFIB   Resultant Symptoms:  right-sided weakness, left-sided gaze preference and a aphasia. Stroke Risk Factors: hyperlipidemia and hypertension, AFIB Other Stroke Risk Factors: Advanced age, Hx stroke, CAD, Prostate Cancer, Hx of MI  PROCEDURES:  08/19/2017  Dr Estanislado Pandy S/P Lt common carotid arteriogram followed by revascularization of Lt MCA distal M1 and mid perisylvian M3 branch  with mechanical thrombolysis achieving a TICI 2 b reperfusion.   08/20/2017 ASSESSMENT:   Remains intubated and sedated. Aphasic, not following commands. Moving LUE spontaneously and occasionally LLE. No spontaneous movement of Right side. +withdrawal to pain. No family at bedside.  PLAN  08/20/2017: Start ASA . [place] [facility] consultation for new onset AFIB CCM to attempt extubation when medically ready Maintain strict B/P parameters Frequent neuro checks Telemetry monitoring PT/OT/SLP Consult PM & Rehab Consult Case Management /MSW Ongoing aggressive stroke risk factor management Patient will be counseled to be  compliant with his antithrombotic medications Patient will be counseled on Lifestyle modifications including, Diet, Exercise, and Stress Follow up with [facility] in 6 weeks  HX OF  STROKES: Left thalamic stroke with mild residual right hemiparesis since January 2018  INTRACRANIAL Atherosclerosis &Stenosis: May consider DAPT if appropriate at discharge  DYSPHAGIA: NPO until passes SLP swallow evaluation Aspiration Precautions in progress  AFIB, NEW ONSET: EKG shows A. Fib, episodes of Bradycardia overnight Cardiology Consultation, Appreciate assistance Continue to HOLD [facility] - for now Will need long-term anticoagulation once safe from a stroke standpoint, if appropriate Repeat CT in 2 weeks for consideration of starting anticoagulation if CT is stable.   MEDICAL ISSUES: CCM following, appreciate assistance Inability to protect airway in the postoperative setting Remains intubated/sedated Wean Fentanyl and SBT's   Mild anemia, at baseline  Hgb 9.9 / Hct 31.2 - stable this morning Repeat CBC in AM  HYPERTENSION: Change blood pressure goal below 110 2180. Wean and taper norepinephrine drip. Discontinue arterial line Long term BP goal normotensive. Home Meds: NONE  HYPERLIPIDEMIA:    Component Value Date/Time   CHOL 140 08/19/2017 0304   TRIG 86 08/19/2017 0304   HDL 36 (L) 08/19/2017 0304   CHOLHDL 3.9 08/19/2017 0304   VLDL 17 08/19/2017 0304   LDLCALC 87 08/19/2017 0304  Home Meds:  Lovaza LDL  goal < 70 Will continued Lovaza and start Lipitor to 10 mg daily, if passes SLP evaluation Continue statin at discharge, if appropriate  R/O DIABETES: Lab Results  Component Value Date   HGBA1C 5.7 (H) 08/19/2017  HgbA1c goal < 7.0  Other Active Problems: Principal Problem:   Acute ischemic left MCA stroke (HCC) Active Problems:   Coronary artery disease due to lipid rich plaque   Normocytic anemia   Pressure injury of skin   Paroxysmal atrial  fibrillation (HCC)   Bilateral carotid artery stenosis    Hospital day # 2 VTE prophylaxis: SCD's  Diet : Diet NPO time specified   FAMILY UPDATES: No family at bedside  TEAM UPDATES: Garvin Fila, MD STATUS:  FULL   Prior Home Stroke Medications:  clopidogrel 75 mg daily  Discharge Stroke Meds:  Please discharge patient on TBD   Disposition: 01-Home or Self Care Therapy Recs:               PENDING Home Equipment:         PENDING Follow Up:  Follow-up Information    Garvin Fila, MD. Schedule an appointment as soon as possible for a visit in 6 week(s).   Specialties:  Neurology, Radiology Contact information: [address] [place] [address] [place] Not In -PCP Follow up in 1-2 weeks   Case Management aware of need     I have personally examined this patient, reviewed notes, independently viewed imaging studies, participated in medical decision making and plan of care.ROS completed by me personally and pertinent positives fully documented  I have made any additions or clarifications directly to the above note.  . Patient has presented with a large left MCA infarcts due to new onset atrial fibrillation and underwent treatment with IV TPA and mechanical thrombectomy with partial recanalization. MRI shows multiple embolic strokes in different vascular distributions Prognosis is guarded. Plan I had a long discussion with the patient's son and daughter-in-law regarding his presentation, discuss MRI findings and prognosis and answered questions. Family agrees to DO NOT RESUSCITATE but would like to give him a trial of extubation and see how he does. Discussed with Dr. Carson Myrtle from pulmonary critical care medicine. They decide on extubation tomorrow  after discussion with family and goals of care about reintubation versus tracheostomy and CODE STATUS discussion. Discussed with Dr.Jeong from critical care medicine This patient is critically ill  and at significant risk of neurological worsening, death and care requires constant monitoring of vital signs, hemodynamics,respiratory and cardiac monitoring, extensive review of multiple databases, frequent neurological assessment, discussion with family, other specialists and medical decision making of high complexity.I have made any additions or clarifications directly to the above note.This critical care time does not reflect procedure time, or teaching time or supervisory time of PA/NP/Med Resident etc but could involve care discussion time.  I spent 30 minutes of neurocritical care time  in the care of  this patient.      Antony Contras, MD Medical Director [facility] Pager: [phone] 08/20/2017 2:04 PM  To contact Stroke Continuity provider, please refer to [email or web address] After hours, contact General Neurology

## 2017-08-20 NOTE — Evaluation (Addendum)
Occupational Therapy Evaluation Patient Details Name: Thomas Hanna MRN: [id number] DOB: June 10, 1924 Today's Date: 08/20/2017    History of Present Illness 84 y.o. male was a past medical history of coronary artery disease, orthostatic hypotension, prostate cancer, left thalamic stroke with mild residual right hemiparesis since January 2018, who presented to the emergency room as an acute code stroke for sudden onset of right-sided weakness, left-sided gaze preference and a aphasia.   Clinical Impression   PTA, pt was living with his son and daughter-in-law and was independent with his ADLs, light IADLs, and driving. Pt currently requiring total A for ADLs and bed mobility. Pt presenting with flaccidity on R side, lethargy, decreased following of commands or engagement, and poor activity tolerance. Pt would benefit from further acute OT to facilitate safe dc. Recommend dc to SNF for further OT to increase occupational performance and participation as well as decrease caregiver burden.     Follow Up Recommendations  SNF;Supervision/Assistance - 24 hour    Equipment Recommendations  Other (comment)(Defer to next venue)    Recommendations for Other Services PT consult;Speech consult     Precautions / Restrictions Precautions Precautions: Fall;Other (comment)(A-line, ET) Restrictions Weight Bearing Restrictions: No      Mobility Bed Mobility Overal bed mobility: Needs Assistance Bed Mobility: Supine to Sit;Sit to Supine     Supine to sit: Total assist;+2 for physical assistance Sit to supine: Total assist;+2 for physical assistance   General bed mobility comments: Total (+3 assist for safety and line management)  Transfers                      Balance Overall balance assessment: Needs assistance   Sitting balance-Leahy Scale: Zero Sitting balance - Comments: total assist of 2 for all aspects of sitting                                   ADL either  performed or assessed with clinical judgement   ADL Overall ADL's : Needs assistance/impaired                                       General ADL Comments: Total A all ADLs and bed mobility     Vision   Vision Assessment?: Vision impaired- to be further tested in functional context Additional Comments: Difficult to assess due to engagment and restlessness. Pt with eyes rolled up and back throughout session and making no eye contact or looking towards sounds.      Perception     Praxis      Pertinent Vitals/Pain Pain Assessment: Faces Faces Pain Scale: No hurt Pain Intervention(s): Monitored during session     Hand Dominance Right   Extremity/Trunk Assessment Upper Extremity Assessment Upper Extremity Assessment: RUE deficits/detail RUE Deficits / Details: Brunnstrom stage 1. Flaccid with no active movement. RUE Coordination: decreased fine motor;decreased gross motor LUE Deficits / Details: Pt with increased movement of LUE and awarness of arm movement and positioning   Lower Extremity Assessment Lower Extremity Assessment: Defer to PT evaluation;RLE deficits/detail RLE Deficits / Details: Brunnstrom stage 1. Flaccid with no active movement.   Cervical / Trunk Assessment Cervical / Trunk Assessment: Other exceptions Cervical / Trunk Exceptions: posteior lean and pushing backward with trunk   Communication Communication Communication: No difficulties   Cognition Arousal/Alertness: Lethargic Behavior During  Therapy: Restless;Agitated Overall Cognitive Status: Difficult to assess                                 General Comments: Pt not follow commands. No head turns or gazing at loud sounds.    General Comments  Family present during evaluation. HR elevating to ~150 with bed mobility. BP in supine before bed mobility was 145/98. BP after bed mobility and return to supine was 166/94    Exercises     Shoulder Instructions      Home  Living Family/patient expects to be discharged to:: Private residence Living Arrangements: Children Available Help at Discharge: Family;Available 24 hours/day Type of Home: House Home Access: Stairs to enter [employer] of Steps: 3 Entrance Stairs-Rails: Right;Left Home Layout: Able to live on main level with bedroom/bathroom     Bathroom Shower/Tub: Occupational psychologist: Standard     Home Equipment: Environmental consultant - 2 wheels;Shower seat - built in;Toilet riser;Grab bars - toilet;Grab bars - tub/shower          Prior Functioning/Environment Level of Independence: Independent        Comments: Active. Drives to [facility] to visit friends and to the casinos in [place].        OT Problem List: Decreased strength;Decreased range of motion;Decreased activity tolerance;Impaired balance (sitting and/or standing);Impaired vision/perception;Decreased cognition;Decreased coordination;Decreased safety awareness;Impaired UE functional use      OT Treatment/Interventions: Self-care/ADL training;Therapeutic exercise;Energy conservation;DME and/or AE instruction;Therapeutic activities;Patient/family education    OT Goals(Current goals can be found in the care plan section) Acute Rehab OT Goals Patient Stated Goal: none stated OT Goal Formulation: With family Time For Goal Achievement: 09/03/17 Potential to Achieve Goals: Good ADL Goals Pt Will Perform Grooming: with mod assist;bed level Additional ADL Goal #1: Pt will participate in 25% of ADL task with Max cues Additional ADL Goal #2: Pt will perform bed mobility with Max A +2 in preparation for ADLs in sitting Additional ADL Goal #3: Pt will maintain sitting at EOB for 2-3 minutes with Max A in preparation for ADLs in sitting  OT Frequency: Min 2X/week   Barriers to D/C:            Co-evaluation PT/OT/SLP Co-Evaluation/Treatment: Yes Reason for Co-Treatment: Necessary to address cognition/behavior during functional  activity   OT goals addressed during session: ADL's and self-care      AM-PAC PT "6 Clicks" Daily Activity     Outcome Measure Help from another person eating meals?: Total Help from another person taking care of personal grooming?: Total Help from another person toileting, which includes using toliet, bedpan, or urinal?: Total Help from another person bathing (including washing, rinsing, drying)?: Total Help from another person to put on and taking off regular upper body clothing?: Total Help from another person to put on and taking off regular lower body clothing?: Total 6 Click Score: 6   End of Session Equipment Utilized During Treatment: Oxygen Nurse Communication: Mobility status  Activity Tolerance: Patient limited by lethargy Patient left: in bed;with call bell/phone within reach;with bed alarm set;with family/visitor present;with nursing/sitter in room;with restraints reapplied;with SCD's reapplied  OT Visit Diagnosis: Muscle weakness (generalized) (M62.81);Ataxia, unspecified (R27.0);Low vision, both eyes (H54.2);Apraxia (R48.2);Hemiplegia and hemiparesis;Other symptoms and signs involving cognitive function Hemiplegia - Right/Left: Right Hemiplegia - dominant/non-dominant: Dominant Hemiplegia - caused by: Cerebral infarction  Time: 8563-1497 OT Time Calculation (min): 25 min Charges:  OT General Charges $OT Visit: 1 Visit OT Evaluation $OT Eval High Complexity: 1 High G-Codes:     Shoshannah Faubert MSOT, OTR/L Acute Rehab Pager: [phone] Office: [place] 08/20/2017, 12:55 PM

## 2017-08-20 NOTE — Evaluation (Signed)
Physical Therapy Evaluation Patient Details Name: Thomas Hanna MRN: [id number] DOB: [birth year] Today's Date: 08/20/2017   History of Present Illness  84 y.o. male was a past medical history of coronary artery disease, orthostatic hypotension, prostate cancer, left thalamic stroke with mild residual right hemiparesis since January 2018, who presented to the emergency room as an acute code stroke for sudden onset of right-sided weakness, left-sided gaze preference and a aphasia.  Clinical Impression  Orders received for PT evaluation. Patient demonstrates deficits in functional mobility as indicated below. Will benefit from continued skilled PT to address deficits and maximize function. Will see as indicated and progress as tolerated.  At this time, patient evaluation limited, patient remains on vent with ET tube and shows no ability to follow commands or engage. Patient with no movement RUE or RLE. No pain withdraw. Active movement is noted on left side but does not appear functional at this time. Will continue to follow.    Follow Up Recommendations SNF;Supervision/Assistance - 24 hour    Equipment Recommendations  (TBD)    Recommendations for Other Services       Precautions / Restrictions Precautions Precautions: Fall;Other (comment)(A-line, ET) Restrictions Weight Bearing Restrictions: No      Mobility  Bed Mobility Overal bed mobility: Needs Assistance Bed Mobility: Supine to Sit;Sit to Supine     Supine to sit: Total assist;+2 for physical assistance Sit to supine: Total assist;+2 for physical assistance   General bed mobility comments: Total (+3 assist for safety and line management)  Transfers                    Ambulation/Gait                Stairs            Wheelchair Mobility    Modified Rankin (Stroke Patients Only) Modified Rankin (Stroke Patients Only) Pre-Morbid Rankin Score: No significant disability Modified Rankin: Severe  disability     Balance Overall balance assessment: Needs assistance   Sitting balance-Leahy Scale: Zero Sitting balance - Comments: total assist of 2 for all aspects of sitting                                     Pertinent Vitals/Pain Pain Assessment: Faces Faces Pain Scale: No hurt Pain Intervention(s): Monitored during session    Home Living Family/patient expects to be discharged to:: Private residence Living Arrangements: Children Available Help at Discharge: Family;Available 24 hours/day Type of Home: House Home Access: Stairs to enter Entrance Stairs-Rails: Psychiatric nurse of Steps: 3 Home Layout: Able to live on main level with bedroom/bathroom Home Equipment: Walker - 2 wheels;Shower seat - built in;Toilet riser;Grab bars - toilet;Grab bars - tub/shower      Prior Function Level of Independence: Independent         Comments: Active. Drives to [facility] to visit friends and to the casinos in [place].     Hand Dominance   Dominant Hand: Right    Extremity/Trunk Assessment   Upper Extremity Assessment Upper Extremity Assessment: RUE deficits/detail;LUE deficits/detail RUE Deficits / Details: Brunnstrom stage 1. Flaccid with no active movement. RUE Coordination: decreased fine motor;decreased gross motor LUE Deficits / Details: Pt with increased movement of LUE and awarness of arm movement and positioning    Lower Extremity Assessment Lower Extremity Assessment: RLE deficits/detail;Difficult to assess due to impaired cognition RLE Deficits / Details: Brunnstrom  stage 1. Flaccid with no active movement.    Cervical / Trunk Assessment Cervical / Trunk Assessment: Other exceptions Cervical / Trunk Exceptions: posteior lean and pushing backward with trunk  Communication   Communication: No difficulties  Cognition Arousal/Alertness: Lethargic Behavior During Therapy: Restless;Agitated Overall Cognitive Status: Difficult to assess                                  General Comments: Pt not follow commands. No head turns or gazing at loud sounds.       General Comments      Exercises     Assessment/Plan    PT Assessment Patient needs continued PT services  PT Problem List Decreased strength;Decreased activity tolerance;Decreased balance;Decreased mobility;Decreased coordination;Decreased cognition;Decreased safety awareness;Cardiopulmonary status limiting activity;Impaired tone       PT Treatment Interventions DME instruction;Gait training;Functional mobility training;Therapeutic activities;Therapeutic exercise;Balance training;Neuromuscular re-education;Patient/family education;Wheelchair mobility training    PT Goals (Current goals can be found in the Care Plan section)  Acute Rehab PT Goals Patient Stated Goal: none stated PT Goal Formulation: With patient/family Time For Goal Achievement: 09/03/17 Potential to Achieve Goals: Fair    Frequency Min 3X/week   Barriers to discharge        Co-evaluation PT/OT/SLP Co-Evaluation/Treatment: Yes Reason for Co-Treatment: Necessary to address cognition/behavior during functional activity;Complexity of the patient's impairments (multi-system involvement)   OT goals addressed during session: ADL's and self-care       AM-PAC PT "6 Clicks" Daily Activity  Outcome Measure Difficulty turning over in bed (including adjusting bedclothes, sheets and blankets)?: Unable Difficulty moving from lying on back to sitting on the side of the bed? : Unable Difficulty sitting down on and standing up from a chair with arms (e.g., wheelchair, bedside commode, etc,.)?: Unable Help needed moving to and from a bed to chair (including a wheelchair)?: Total Help needed walking in hospital room?: Total Help needed climbing 3-5 steps with a railing? : Total 6 Click Score: 6    End of Session Equipment Utilized During Treatment: Oxygen Activity Tolerance: Treatment  limited secondary to medical complications (Comment)(restless, elevated HR and BP) Patient left: in bed;with call bell/phone within reach;with bed alarm set;with family/visitor present Nurse Communication: Mobility status PT Visit Diagnosis: Hemiplegia and hemiparesis Hemiplegia - Right/Left: Right Hemiplegia - dominant/non-dominant: Dominant Hemiplegia - caused by: Cerebral infarction    Time: 9357-0177 PT Time Calculation (min) (ACUTE ONLY): 25 min   Charges:   PT Evaluation $PT Eval High Complexity: 1 High     PT G Codes:        Alben Deeds, PT DPT  Board Certified Neurologic Specialist [phone]   Duncan Dull 08/20/2017, 11:39 AM

## 2017-08-20 NOTE — Progress Notes (Signed)
Referring Physician(s): Dr. Leonie Man  Supervising Physician: Luanne Bras  Patient Status:  [facility] - In-pt  Chief Complaint: CVA  Subjective: Remains intubated, sedated. Not moving right side, not following commands.   Allergies: Sulfa antibiotics  Medications: Prior to Admission medications   Medication Sig Start Date End Date Taking? Authorizing Provider  Ascorbic Acid (VITAMIN C) 100 MG tablet Take 100 mg by mouth daily.   Yes [provider]  cholecalciferol (VITAMIN D) 1000 units tablet Take 1,000 Units by mouth daily.   Yes [provider]  clopidogrel (PLAVIX) 75 MG tablet Take 75 mg by mouth daily.   Yes [provider]  dorzolamide (TRUSOPT) 2 % ophthalmic solution Place 1 drop into both eyes 2 (two) times daily.   Yes [provider]  ferrous sulfate 325 (65 FE) MG tablet Take 325 mg by mouth every morning.    Yes [provider]  folic acid (FOLVITE) 884 MCG tablet Take 400 mcg by mouth daily.   Yes [provider]  gabapentin (NEURONTIN) 100 MG capsule Take 300-600 mg by mouth See admin instructions. Pt takes 600 mg in the morning and noon and 300 mg at bedtime   Yes [provider]  Ginkgo Biloba (GINKOBA PO) Take 1 capsule by mouth daily.   Yes [provider]  latanoprost (XALATAN) 0.005 % ophthalmic solution Place 1 drop into both eyes at bedtime.    Yes [provider]  midodrine (PROAMATINE) 5 MG tablet Take 0.5 tablets (2.5 mg total) by mouth 2 (two) times daily with a meal. 05/15/17  Yes Troy Sine, MD  omega-3 acid ethyl esters (LOVAZA) 1 g capsule Take 1 g by mouth 2 (two) times daily.   Yes [provider]  tamsulosin (FLOMAX) 0.4 MG CAPS capsule Take 0.4 mg by mouth daily after supper.    Yes [provider]  vitamin B-12 (CYANOCOBALAMIN) 100 MCG tablet Take 100 mcg by mouth daily.   Yes [provider]     Vital Signs: BP 111/65   Pulse 64    Temp (!) 97.5 F (36.4 C) (Axillary)   Resp 19   Ht 6\' 2"  (1.88 m)   Wt 177 lb 4 oz (80.4 kg)   SpO2 100%   BMI 22.76 kg/m   Physical Exam  NAD, intubated, sedated Neuro: purposeful movement with left hand. Not following commands.   Groin: soft, intact.  No evidence of hematoma or pseudoaneurysm.   Imaging: Ct Angio Head W Or Wo Contrast  Result Date: 08/18/2017 CLINICAL DATA:  84 y/o M; right-sided weakness and speech difficulty. EXAM: CT ANGIOGRAPHY HEAD AND NECK CT PERFUSION BRAIN TECHNIQUE: Multidetector CT imaging of the head and neck was performed using the standard protocol during bolus administration of intravenous contrast. Multiplanar CT image reconstructions and MIPs were obtained to evaluate the vascular anatomy. Carotid stenosis measurements (when applicable) are obtained utilizing NASCET criteria, using the distal internal carotid diameter as the denominator. Multiphase CT imaging of the brain was performed following IV bolus contrast injection. Subsequent parametric perfusion maps were calculated using RAPID software. CONTRAST:  137mL ISOVUE-370 IOPAMIDOL (ISOVUE-370) INJECTION 76% COMPARISON:  09/04/2016 CTA head. FINDINGS: CTA NECK FINDINGS Aortic arch: Bovine variant branching. Imaged portion shows no evidence of aneurysm or dissection. No significant stenosis of the major arch vessel origins. Moderate calcific atherosclerosis. Right carotid system: Dense calcified plaque of right carotid bifurcation with 60-70% proximal ICA stenosis. Left carotid system: Dense calcified plaque of left carotid bifurcation  with 60-70% proximal ICA stenosis. \ Vertebral arteries: Calcified plaque of right vertebral artery origin with moderate 50-70% stenosis. Patent right vertebral artery. Skeleton: Mild cervical spondylosis. No high-grade bony canal stenosis per Other neck: Negative. Upper chest: Right greater than left apical pleuroparenchymal scarring. Review of the MIP images confirms the above  findings CTA HEAD FINDINGS Anterior circulation: Right paraclinoid and terminal ICA occlusion/near occlusion. Contrast opacification of the right upper cervical ICA diminishes gradually to the terminal segment of ICA suggesting a degree of patency, severe stenosis was present on the prior study, this may represent chronic severe stenosis with poor antegrade flow or interval thrombosis. Severe left paraclinoid ICA stenosis. Distal left M1 occlusion extending into left M2 superior division. Early reconstitution of left M2 inferior division via collateralization. Poor downstream collateralization in the superior left MCA distribution. Posterior circulation: No significant stenosis, proximal occlusion, aneurysm, or vascular malformation. Venous sinuses: As permitted by contrast timing, patent. Anatomic variants: Fetal right PCA. Patent anterior communicating artery. No left posterior communicating artery identified, likely hypoplastic or absent. Review of the MIP images confirms the above findings CT Brain Perfusion Findings: CBF (<30%) Volume: 10mL Perfusion (Tmax>6.0s) volume: 183mL Mismatch Volume: 151mL Infarction Location:Left superior MCA IMPRESSION: CTA neck: 1. Patent carotid and vertebral arteries of the neck. 2. Dense calcified plaque of bilateral carotid bifurcations with proximal ICA moderate 60-70% stenosis. 3. Moderate stenosis of right vertebral artery origin. CTA head: 1. Distal left M1 occlusion extending into left M2 superior division. Early reconstitution of left M2 inferior division via collateralization. Poor downstream collateralization and left superior MCA distribution. 2. Left paraclinoid ICA severe stenosis. 3. Right paraclinoid and terminal ICA occlusion or near occlusion. Contrast opacification of the right upper cervical ICA diminishes gradually to the terminal segment, severe stenosis was present on the prior study. Findings may represent chronic severe stenosis with poor antegrade flow or  interval thrombosis. CT brain perfusion: 1. Core infarct volume of 71 cc. 2. Mismatch ischemic penumbra of 102 cc. 3. Infarct location left superior MCA distribution. These results were called by telephone at the time of interpretation on 08/18/2017 at 10:38 pm to Dr. Amie Portland , who verbally acknowledged these results. Electronically Signed   By: Kristine Garbe M.D.   On: 08/18/2017 22:54   Ct Head Wo Contrast  Result Date: 08/19/2017 CLINICAL DATA:  84 y/o M; status post intra-arterial intervention for stroke. EXAM: CT HEAD WITHOUT CONTRAST TECHNIQUE: Contiguous axial images were obtained from the base of the skull through the vertex without intravenous contrast. COMPARISON:  08/18/2016 CT head, CT angiogram head, CT perfusion head. FINDINGS: Brain: Loss of gray-white differentiation in the insula and frontal operculum best appreciated on coronal reconstruction (series 5, image 32) compatible with evolving infarction. No additional area of infarction is identified at this time. No acute hemorrhage. Stable background of chronic microvascular ischemic changes and parenchymal volume loss of the brain. No hydrocephalus, extra-axial collection, or effacement of basilar cisterns. Vascular: Persistent contrast enhancement. Skull: Normal. Negative for fracture or focal lesion. Sinuses/Orbits: No acute finding. Other: None. IMPRESSION: Loss of gray-white differentiation within the insula and frontal operculum compatible with evolving acute infarction. No additional area of infarction identified at this time. No intracranial hemorrhage or mass effect. Electronically Signed   By: Kristine Garbe M.D.   On: 08/19/2017 02:59   Ct Angio Neck W Or Wo Contrast  Result Date: 08/18/2017 CLINICAL DATA:  84 y/o M; right-sided weakness and speech difficulty. EXAM: CT ANGIOGRAPHY HEAD AND NECK CT PERFUSION  BRAIN TECHNIQUE: Multidetector CT imaging of the head and neck was performed using the standard protocol  during bolus administration of intravenous contrast. Multiplanar CT image reconstructions and MIPs were obtained to evaluate the vascular anatomy. Carotid stenosis measurements (when applicable) are obtained utilizing NASCET criteria, using the distal internal carotid diameter as the denominator. Multiphase CT imaging of the brain was performed following IV bolus contrast injection. Subsequent parametric perfusion maps were calculated using RAPID software. CONTRAST:  159mL ISOVUE-370 IOPAMIDOL (ISOVUE-370) INJECTION 76% COMPARISON:  09/04/2016 CTA head. FINDINGS: CTA NECK FINDINGS Aortic arch: Bovine variant branching. Imaged portion shows no evidence of aneurysm or dissection. No significant stenosis of the major arch vessel origins. Moderate calcific atherosclerosis. Right carotid system: Dense calcified plaque of right carotid bifurcation with 60-70% proximal ICA stenosis. Left carotid system: Dense calcified plaque of left carotid bifurcation with 60-70% proximal ICA stenosis. \ Vertebral arteries: Calcified plaque of right vertebral artery origin with moderate 50-70% stenosis. Patent right vertebral artery. Skeleton: Mild cervical spondylosis. No high-grade bony canal stenosis per Other neck: Negative. Upper chest: Right greater than left apical pleuroparenchymal scarring. Review of the MIP images confirms the above findings CTA HEAD FINDINGS Anterior circulation: Right paraclinoid and terminal ICA occlusion/near occlusion. Contrast opacification of the right upper cervical ICA diminishes gradually to the terminal segment of ICA suggesting a degree of patency, severe stenosis was present on the prior study, this may represent chronic severe stenosis with poor antegrade flow or interval thrombosis. Severe left paraclinoid ICA stenosis. Distal left M1 occlusion extending into left M2 superior division. Early reconstitution of left M2 inferior division via collateralization. Poor downstream collateralization in  the superior left MCA distribution. Posterior circulation: No significant stenosis, proximal occlusion, aneurysm, or vascular malformation. Venous sinuses: As permitted by contrast timing, patent. Anatomic variants: Fetal right PCA. Patent anterior communicating artery. No left posterior communicating artery identified, likely hypoplastic or absent. Review of the MIP images confirms the above findings CT Brain Perfusion Findings: CBF (<30%) Volume: 37mL Perfusion (Tmax>6.0s) volume: 116mL Mismatch Volume: 121mL Infarction Location:Left superior MCA IMPRESSION: CTA neck: 1. Patent carotid and vertebral arteries of the neck. 2. Dense calcified plaque of bilateral carotid bifurcations with proximal ICA moderate 60-70% stenosis. 3. Moderate stenosis of right vertebral artery origin. CTA head: 1. Distal left M1 occlusion extending into left M2 superior division. Early reconstitution of left M2 inferior division via collateralization. Poor downstream collateralization and left superior MCA distribution. 2. Left paraclinoid ICA severe stenosis. 3. Right paraclinoid and terminal ICA occlusion or near occlusion. Contrast opacification of the right upper cervical ICA diminishes gradually to the terminal segment, severe stenosis was present on the prior study. Findings may represent chronic severe stenosis with poor antegrade flow or interval thrombosis. CT brain perfusion: 1. Core infarct volume of 71 cc. 2. Mismatch ischemic penumbra of 102 cc. 3. Infarct location left superior MCA distribution. These results were called by telephone at the time of interpretation on 08/18/2017 at 10:38 pm to Dr. Amie Portland , who verbally acknowledged these results. Electronically Signed   By: Kristine Garbe M.D.   On: 08/18/2017 22:54   [name]  Result Date: 08/19/2017 CLINICAL DATA:  Follow-up examination for acute stroke. Status post tPA with catheter directed thrombectomy. EXAM: MRI HEAD WITHOUT CONTRAST  TECHNIQUE: Multiplanar, multiecho pulse sequences of the brain and surrounding structures were obtained without intravenous contrast. COMPARISON:  Prior CTs from earlier the same day as well as 08/18/2017. FINDINGS: Brain: Cerebral volume normal. Mild chronic microvascular  ischemic changes present within the periventricular white matter. Patchy multifocal left MCA territory infarcts seen involving the left frontal and parietal lobes, with involvement of the left insula and left frontal operculum. Patchy involvement within the left periatrial white matter. Confluent restricted diffusion at the parasagittal left frontal parietal region consistent with left ACA territory infarct and/or watershed infarct. There are additional patchy multifocal cortical infarcts involving the right parietal lobe and posterior insular region. Patchy infarct within the right periatrial white matter. Small 1 cm acute left cerebellar infarct noted as well. No associated mass effect. Associated petechial hemorrhage at the level of the left frontal operculum without frank hemorrhagic transformation (series 8, image 17). No mass lesion, midline shift, or mass effect. No hydrocephalus. No extra-axial fluid collection. Pituitary gland suprasellar region normal. Midline structures intact and normal. Vascular: Major intracranial vascular flow voids are maintained at the skull base. Skull and upper cervical spine: Craniocervical junction normal. Upper cervical spine normal. Bone marrow signal intensity within normal limits. No scalp soft tissue abnormality. Sinuses/Orbits: Globes and orbital soft tissues demonstrate no acute abnormality. Patient status post cataract extraction bilaterally. Subtle asymmetric thickening at the left posterolateral sclerotic noted, which may be postoperative in nature. Scattered mucosal thickening throughout the paranasal sinuses. No air-fluid level to suggest acute sinusitis. Mastoid air cells are largely clear. Inner  ear structures normal. Other: None IMPRESSION: 1. Patchy multifocal acute ischemic infarcts involving the bilateral cerebral hemispheres, with additional 1 cm left cerebellar infarct as above. Associated petechial hemorrhage at the left frontal operculum without hemorrhagic transformation. 2. Underlying mild chronic microvascular ischemic disease. Electronically Signed   By: Jeannine Boga M.D.   On: 08/19/2017 17:17   Ct Cerebral Perfusion W Contrast  Result Date: 08/18/2017 CLINICAL DATA:  84 y/o M; right-sided weakness and speech difficulty. EXAM: CT ANGIOGRAPHY HEAD AND NECK CT PERFUSION BRAIN TECHNIQUE: Multidetector CT imaging of the head and neck was performed using the standard protocol during bolus administration of intravenous contrast. Multiplanar CT image reconstructions and MIPs were obtained to evaluate the vascular anatomy. Carotid stenosis measurements (when applicable) are obtained utilizing NASCET criteria, using the distal internal carotid diameter as the denominator. Multiphase CT imaging of the brain was performed following IV bolus contrast injection. Subsequent parametric perfusion maps were calculated using RAPID software. CONTRAST:  168mL ISOVUE-370 IOPAMIDOL (ISOVUE-370) INJECTION 76% COMPARISON:  09/04/2016 CTA head. FINDINGS: CTA NECK FINDINGS Aortic arch: Bovine variant branching. Imaged portion shows no evidence of aneurysm or dissection. No significant stenosis of the major arch vessel origins. Moderate calcific atherosclerosis. Right carotid system: Dense calcified plaque of right carotid bifurcation with 60-70% proximal ICA stenosis. Left carotid system: Dense calcified plaque of left carotid bifurcation with 60-70% proximal ICA stenosis. \ Vertebral arteries: Calcified plaque of right vertebral artery origin with moderate 50-70% stenosis. Patent right vertebral artery. Skeleton: Mild cervical spondylosis. No high-grade bony canal stenosis per Other neck: Negative. Upper  chest: Right greater than left apical pleuroparenchymal scarring. Review of the MIP images confirms the above findings CTA HEAD FINDINGS Anterior circulation: Right paraclinoid and terminal ICA occlusion/near occlusion. Contrast opacification of the right upper cervical ICA diminishes gradually to the terminal segment of ICA suggesting a degree of patency, severe stenosis was present on the prior study, this may represent chronic severe stenosis with poor antegrade flow or interval thrombosis. Severe left paraclinoid ICA stenosis. Distal left M1 occlusion extending into left M2 superior division. Early reconstitution of left M2 inferior division via collateralization. Poor downstream collateralization in the superior  left MCA distribution. Posterior circulation: No significant stenosis, proximal occlusion, aneurysm, or vascular malformation. Venous sinuses: As permitted by contrast timing, patent. Anatomic variants: Fetal right PCA. Patent anterior communicating artery. No left posterior communicating artery identified, likely hypoplastic or absent. Review of the MIP images confirms the above findings CT Brain Perfusion Findings: CBF (<30%) Volume: 46mL Perfusion (Tmax>6.0s) volume: 160mL Mismatch Volume: 19mL Infarction Location:Left superior MCA IMPRESSION: CTA neck: 1. Patent carotid and vertebral arteries of the neck. 2. Dense calcified plaque of bilateral carotid bifurcations with proximal ICA moderate 60-70% stenosis. 3. Moderate stenosis of right vertebral artery origin. CTA head: 1. Distal left M1 occlusion extending into left M2 superior division. Early reconstitution of left M2 inferior division via collateralization. Poor downstream collateralization and left superior MCA distribution. 2. Left paraclinoid ICA severe stenosis. 3. Right paraclinoid and terminal ICA occlusion or near occlusion. Contrast opacification of the right upper cervical ICA diminishes gradually to the terminal segment, severe  stenosis was present on the prior study. Findings may represent chronic severe stenosis with poor antegrade flow or interval thrombosis. CT brain perfusion: 1. Core infarct volume of 71 cc. 2. Mismatch ischemic penumbra of 102 cc. 3. Infarct location left superior MCA distribution. These results were called by telephone at the time of interpretation on 08/18/2017 at 10:38 pm to Dr. Amie Portland , who verbally acknowledged these results. Electronically Signed   By: Kristine Garbe M.D.   On: 08/18/2017 22:54   Portable Chest Xray  Result Date: 08/19/2017 CLINICAL DATA:  84 y/o M; encounter for intubation, shortness of breath. EXAM: PORTABLE CHEST 1 VIEW COMPARISON:  02/14/2017 chest radiograph FINDINGS: Endotracheal tube 4.8 cm above the carina. Stable normal cardiac silhouette given projection and technique. Aortic atherosclerosis. Status post CABG. Sternotomy wires are aligned. No consolidation, effusion, or pneumothorax identified. Multilevel degenerative changes of thoracic spine. IMPRESSION: Endotracheal tube 4.8 cm from carina. Aortic atherosclerosis. No acute pulmonary process identified. Electronically Signed   By: Kristine Garbe M.D.   On: 08/19/2017 02:53   Dg Abd Portable 1v  Result Date: 08/19/2017 CLINICAL DATA:  Status post orogastric tube placement. EXAM: PORTABLE ABDOMEN - 1 VIEW COMPARISON:  None in PACs FINDINGS: The orogastric tube tip and proximal port projects below the left hemidiaphragm. The tip of the tube likely lies in the mid gastric body. The proximal port lies below the expected location of the GE junction. The bowel gas pattern is within the limits of normal. IMPRESSION: The esophagogastric tube tip and proximal port of appear to lie in the stomach. Electronically Signed   By: David  Martinique M.D.   On: 08/19/2017 07:20   Ct Head Code Stroke Wo Contrast  Result Date: 08/18/2017 CLINICAL DATA:  Code stroke. 84 y/o M; right-sided weakness and speech difficulty.  EXAM: CT HEAD WITHOUT CONTRAST TECHNIQUE: Contiguous axial images were obtained from the base of the skull through the vertex without intravenous contrast. COMPARISON:  09/04/2016 CT angiogram head.  12/07/2016 MRI head. FINDINGS: Brain: No evidence of acute infarction, hemorrhage, hydrocephalus, extra-axial collection or mass lesion/mass effect. Stable background of chronic microvascular ischemic changes and brain parenchymal volume loss. Small chronic infarct within left thalamus. Vascular: Calcific atherosclerosis of carotid siphons. Density within left M2 superior division (series 6, image 47). Skull: Normal. Negative for fracture or focal lesion. Sinuses/Orbits: No acute finding. Other: Bilateral intra-ocular lens replacement. ASPECTS [facility] Stroke Program Early CT Score) - Ganglionic level infarction (caudate, lentiform nuclei, internal capsule, insula, M1-M3 cortex): 7 - Supraganglionic infarction (M4-M6 cortex):  3 Total score (0-10 with 10 being normal): 10 IMPRESSION: 1. No acute stroke, hemorrhage, or mass effect identified. 2. Density in left M2 superior division, possible thrombus. 3. ASPECTS is 10 4. Stable chronic microvascular ischemic changes and parenchymal volume loss of the brain. These results were called by telephone at the time of interpretation on 08/18/2017 at 10:11 pm to Dr. Carmin Muskrat , who verbally acknowledged these results. Electronically Signed   By: Kristine Garbe M.D.   On: 08/18/2017 22:11    Labs:  CBC: Recent Labs    08/18/17 2151 08/18/17 2202 08/19/17 0304 08/19/17 0926 08/20/17 0522  WBC 10.5  --  11.4* 12.6* 12.7*  HGB 11.5* 11.9* 9.6* 9.9* 9.9*  HCT 35.9* 35.0* 30.1* 31.2* 31.4*  PLT 209  --  199 207 194    COAGS: Recent Labs    08/28/16 2045 09/03/16 1337 08/18/17 2151  INR 0.95 1.02 1.04  APTT 30 33 30    BMP: Recent Labs    03/26/17 0902 08/18/17 2151 08/18/17 2202 08/19/17 0304 08/20/17 0522  NA 137 135 139 136 137  K  4.6 3.8 3.9 3.6 3.5  CL 101 103 102 106 109  CO2 26 26  --  21* 20*  GLUCOSE 95 147* 142* 135* 110*  BUN 20 28* 27* 24* 18  CALCIUM 9.2 8.9  --  8.1* 7.9*  CREATININE 0.85 0.76 0.80 0.71 0.70  GFRNONAA 76 >60  --  >60 >60  GFRAA 88 >60  --  >60 >60    LIVER FUNCTION TESTS: Recent Labs    09/03/16 1337 02/14/17 1142 03/26/17 0902 08/18/17 2151  BILITOT 0.9 0.4 0.8 0.8  AST 23 22 19 23   ALT 16* 13 13 22   ALKPHOS 58 73 100 65  PROT 6.6 7.1 7.0 6.7  ALBUMIN 3.7 4.6 4.3 4.0    Assessment and Plan: CVA s/p revascularization of L MCA distal M1 and mid perisylvian M3 branch with mechanical thrombolysis achieving a TICI 2b reperfusion Patient remains intubated, sedated.  Not following commands.  Moving left side only during visit today.  Sheath removed yesterday.  Groin soft.  IR available if needed.   Electronically Signed: Docia Barrier, PA 08/20/2017, 4:06 PM   I spent a total of 15 Minutes at the the patient's bedside AND on the patient's hospital floor or unit, greater than 50% of which was counseling/coordinating care for CVA.

## 2017-08-21 ENCOUNTER — Inpatient Hospital Stay (HOSPITAL_COMMUNITY): Payer: Medicare Other

## 2017-08-21 DIAGNOSIS — Z978 Presence of other specified devices: Secondary | ICD-10-CM

## 2017-08-21 DIAGNOSIS — J690 Pneumonitis due to inhalation of food and vomit: Secondary | ICD-10-CM | POA: Diagnosis not present

## 2017-08-21 DIAGNOSIS — I63512 Cerebral infarction due to unspecified occlusion or stenosis of left middle cerebral artery: Secondary | ICD-10-CM

## 2017-08-21 DIAGNOSIS — K567 Ileus, unspecified: Secondary | ICD-10-CM | POA: Diagnosis not present

## 2017-08-21 LAB — BASIC METABOLIC PANEL
ANION GAP: 7 (ref 5–15)
BUN: 17 mg/dL (ref 6–20)
CHLORIDE: 111 mmol/L (ref 101–111)
CO2: 21 mmol/L — AB (ref 22–32)
Calcium: 7.8 mg/dL — ABNORMAL LOW (ref 8.9–10.3)
Creatinine, Ser: 0.58 mg/dL — ABNORMAL LOW (ref 0.61–1.24)
GFR calc Af Amer: >60 mL/min (ref >60)
GLUCOSE: 122 mg/dL — AB (ref 65–99)
POTASSIUM: 3.7 mmol/L (ref 3.5–5.1)
Sodium: 139 mmol/L (ref 135–145)

## 2017-08-21 LAB — PHOSPHORUS
PHOSPHORUS: 2 mg/dL — AB (ref 2.5–4.6)
Phosphorus: 2.4 mg/dL — ABNORMAL LOW (ref 2.5–4.6)

## 2017-08-21 LAB — CBC
HEMATOCRIT: 32.9 % — AB (ref 39.0–52.0)
HEMOGLOBIN: 10.4 g/dL — AB (ref 13.0–17.0)
MCH: 29.9 pg (ref 26.0–34.0)
MCHC: 31.6 g/dL (ref 30.0–36.0)
MCV: 94.5 fL (ref 78.0–100.0)
Platelets: 193 10*3/uL (ref 150–400)
RBC: 3.48 MIL/uL — AB (ref 4.22–5.81)
RDW: 14.4 % (ref 11.5–15.5)
WBC: 14 10*3/uL — ABNORMAL HIGH (ref 4.0–10.5)

## 2017-08-21 LAB — GLUCOSE, CAPILLARY
GLUCOSE-CAPILLARY: 153 mg/dL — AB (ref 65–99)
GLUCOSE-CAPILLARY: 161 mg/dL — AB (ref 65–99)
GLUCOSE-CAPILLARY: 163 mg/dL — AB (ref 65–99)
Glucose-Capillary: 117 mg/dL — ABNORMAL HIGH (ref 65–99)
Glucose-Capillary: 153 mg/dL — ABNORMAL HIGH (ref 65–99)

## 2017-08-21 LAB — MAGNESIUM
MAGNESIUM: 2.4 mg/dL (ref 1.7–2.4)
Magnesium: 1.9 mg/dL (ref 1.7–2.4)

## 2017-08-21 MED ORDER — POTASSIUM CHLORIDE 10 MEQ/100ML IV SOLN
10.0000 meq | INTRAVENOUS | Status: AC
Start: 2017-08-21 — End: 2017-08-21
  Administered 2017-08-21 (×4): 10 meq via INTRAVENOUS
  Filled 2017-08-21 (×4): qty 100

## 2017-08-21 MED ORDER — ASPIRIN 300 MG RE SUPP
300.0000 mg | Freq: Every day | RECTAL | Status: DC
  Administered 2017-08-21 – 2017-08-24 (×4): 300 mg via RECTAL
  Filled 2017-08-21 (×4): qty 1

## 2017-08-21 MED ORDER — DILTIAZEM LOAD VIA INFUSION
10.0000 mg | Freq: Once | INTRAVENOUS | Status: AC
  Administered 2017-08-21: 10 mg via INTRAVENOUS
  Filled 2017-08-21: qty 10

## 2017-08-21 MED ORDER — MIDAZOLAM HCL 2 MG/2ML IJ SOLN: 1.0000 mg | INTRAMUSCULAR | Status: DC | PRN

## 2017-08-21 MED ORDER — VANCOMYCIN HCL IN DEXTROSE 1-5 GM/200ML-% IV SOLN
1000.0000 mg | Freq: Two times a day (BID) | INTRAVENOUS | Status: DC
  Administered 2017-08-21 – 2017-08-24 (×6): 1000 mg via INTRAVENOUS
  Filled 2017-08-21 (×7): qty 200

## 2017-08-21 MED ORDER — HEPARIN SODIUM (PORCINE) 5000 UNIT/ML IJ SOLN
5000.0000 [IU] | Freq: Three times a day (TID) | INTRAMUSCULAR | Status: DC
  Administered 2017-08-21 – 2017-08-22 (×3): 5000 [IU] via SUBCUTANEOUS
  Filled 2017-08-21 (×3): qty 1

## 2017-08-21 MED ORDER — METOCLOPRAMIDE HCL 5 MG/ML IJ SOLN
5.0000 mg | Freq: Three times a day (TID) | INTRAMUSCULAR | Status: AC
  Administered 2017-08-21 – 2017-08-22 (×3): 5 mg via INTRAVENOUS
  Filled 2017-08-21 (×3): qty 2

## 2017-08-21 MED ORDER — DEXMEDETOMIDINE HCL IN NACL 200 MCG/50ML IV SOLN
0.0000 ug/kg/h | INTRAVENOUS | Status: AC
  Administered 2017-08-21: 0.4 ug/kg/h via INTRAVENOUS
  Filled 2017-08-21: qty 50

## 2017-08-21 MED ORDER — ONDANSETRON HCL 4 MG/2ML IJ SOLN
4.0000 mg | Freq: Four times a day (QID) | INTRAMUSCULAR | Status: DC | PRN
  Administered 2017-08-22: 4 mg via INTRAVENOUS
  Filled 2017-08-21: qty 2

## 2017-08-21 MED ORDER — DILTIAZEM HCL 100 MG IV SOLR
5.0000 mg/h | INTRAVENOUS | Status: DC
  Administered 2017-08-21 – 2017-08-22 (×3): 5 mg/h via INTRAVENOUS
  Filled 2017-08-21 (×4): qty 100

## 2017-08-21 MED ORDER — MIDAZOLAM HCL 2 MG/2ML IJ SOLN
1.0000 mg | Freq: Once | INTRAMUSCULAR | Status: AC
  Administered 2017-08-21: 1 mg via INTRAVENOUS

## 2017-08-21 MED ORDER — METOCLOPRAMIDE HCL 5 MG/ML IJ SOLN: 10.0000 mg | Freq: Three times a day (TID) | INTRAMUSCULAR | Status: DC

## 2017-08-21 MED ORDER — SODIUM CHLORIDE 0.9 % IV SOLN
3.0000 g | Freq: Three times a day (TID) | INTRAVENOUS | Status: AC
  Administered 2017-08-21 – 2017-08-28 (×21): 3 g via INTRAVENOUS
  Filled 2017-08-21 (×28): qty 3

## 2017-08-21 MED ORDER — MAGNESIUM SULFATE 2 GM/50ML IV SOLN
2.0000 g | Freq: Once | INTRAVENOUS | Status: AC
  Administered 2017-08-21: 2 g via INTRAVENOUS
  Filled 2017-08-21: qty 50

## 2017-08-21 MED ORDER — MIDAZOLAM HCL 2 MG/2ML IJ SOLN
1.0000 mg | INTRAMUSCULAR | Status: DC | PRN
  Filled 2017-08-21: qty 2

## 2017-08-21 MED ORDER — BISACODYL 10 MG RE SUPP
10.0000 mg | Freq: Every day | RECTAL | Status: DC | PRN
  Administered 2017-08-21 – 2017-08-22 (×2): 10 mg via RECTAL
  Filled 2017-08-21 (×2): qty 1

## 2017-08-21 NOTE — Progress Notes (Signed)
Spoke with Dr Oval Linsey about the patients AFIB not maintaining its once controlled rate. She stated she would come and see the patient this morning and add medication based on her assessment. Will continue to monitor.

## 2017-08-21 NOTE — Progress Notes (Signed)
PULMONARY / CRITICAL CARE MEDICINE   Name: Thomas Hanna MRN: [id number] DOB: June 30, 1924    ADMISSION DATE:  08/18/2017 CONSULTATION DATE: August 19, 2017  REFERRING MD: Dr. Leonie Man  CHIEF COMPLAINT: Stroke  HISTORY OF PRESENT ILLNESS:   This 84 year old male is seen in consultation at the request of Dr. Leonie Man previous left thalamic stroke with mild residual right hemiparesis(January 2018) presented to [facility] emergency department on 1/7 with complaints of right-sided weakness, left-sided gaze preference, and aphasia.  Last known well at 1900 on 1/7.  He presented to the emergency department as a code stroke and was given IV tPA after noncontrast CT was negative for acute bleed.  Further cerebral imaging demonstrated left distal M1/proximal M2 occlusion.  He was taken to interventional radiology and underwent mechanical thrombolysis.  Post procedurally he was sent to the ICU for recovery and remained on the ventilator.  PCCM asked to consult.  - Interim events: around 0500 this am, the patient started to vomit around the OGT. Fentanyl gtt @ 250 mcg/hr. Patient is obtunded. Arousable to tactile stimuli. Follows some commands. Much less interactive today. Green pea colored secretions from ETT circuit. Started on Cardizem gtt this am for rapid ventricular response.   PAST MEDICAL HISTORY :  He  has a past medical history of Coronary artery disease, MI (myocardial infarction) ([place]), Orthostatic hypotension (08/2016), Prostate cancer ([place]), and Stroke ([place]).  PAST SURGICAL HISTORY: He  has a past surgical history that includes Cardiac surgery; cardiac stents; Coronary artery bypass graft; and Radiology with anesthesia (N/A, 08/18/2017).  Allergies  Allergen Reactions  . Sulfa Antibiotics Other (See Comments)    Unknown    No current facility-administered medications on file prior to encounter.    Current Outpatient Medications on File Prior to Encounter  Medication Sig  . Ascorbic Acid  (VITAMIN C) 100 MG tablet Take 100 mg by mouth daily.  . cholecalciferol (VITAMIN D) 1000 units tablet Take 1,000 Units by mouth daily.  . clopidogrel (PLAVIX) 75 MG tablet Take 75 mg by mouth daily.  . dorzolamide (TRUSOPT) 2 % ophthalmic solution Place 1 drop into both eyes 2 (two) times daily.  . ferrous sulfate 325 (65 FE) MG tablet Take 325 mg by mouth every morning.   . folic acid (FOLVITE) 242 MCG tablet Take 400 mcg by mouth daily.  Marland Kitchen gabapentin (NEURONTIN) 100 MG capsule Take 300-600 mg by mouth See admin instructions. Pt takes 600 mg in the morning and noon and 300 mg at bedtime  . Ginkgo Biloba (GINKOBA PO) Take 1 capsule by mouth daily.  Marland Kitchen latanoprost (XALATAN) 0.005 % ophthalmic solution Place 1 drop into both eyes at bedtime.   . midodrine (PROAMATINE) 5 MG tablet Take 0.5 tablets (2.5 mg total) by mouth 2 (two) times daily with a meal.  . omega-3 acid ethyl esters (LOVAZA) 1 g capsule Take 1 g by mouth 2 (two) times daily.  . tamsulosin (FLOMAX) 0.4 MG CAPS capsule Take 0.4 mg by mouth daily after supper.   . vitamin B-12 (CYANOCOBALAMIN) 100 MCG tablet Take 100 mcg by mouth daily.    FAMILY HISTORY:  His indicated that his mother is deceased. He indicated that his father is deceased. He indicated that the status of his sister is unknown.   SOCIAL HISTORY: He  reports that he quit smoking about 49 years ago. he has never used smokeless tobacco. He reports that he does not drink alcohol or use drugs.  REVIEW OF SYSTEMS:   Unable  as patient is encephalopathic and intubated  SUBJECTIVE:    VITAL SIGNS: BP 135/83   Pulse 98   Temp 98.4 F (36.9 C)   Resp 17   Ht 6\' 2"  (1.88 m)   Wt 84.1 kg (185 lb 6.5 oz)   SpO2 95%   BMI 23.80 kg/m   HEMODYNAMICS:    VENTILATOR SETTINGS: Vent Mode: PRVC FiO2 (%):  [40 %] 40 % Set Rate:  [6 bmp-12 bmp] 12 bmp Vt Set:  [650 mL] 650 mL PEEP:  [5 cmH20] 5 cmH20 Pressure Support:  [10 cmH20] 10 cmH20 Plateau Pressure:  [15  cmH20-22 cmH20] 18 cmH20  INTAKE / OUTPUT: I/O last 3 completed shifts: In: 4765.6 [I.V.:4009.6; NG/GT:756] Out: 0981 [Urine:1275; Emesis/NG output:200]  PHYSICAL EXAMINATION: General:  Intubated and sedated. Neuro:  Sedated HEENT:  [place]/AT, PERRL, no JVD Cardiovascular:  IRIR, no MRG Lungs:  Clear Abdomen:  Soft, non-distended Musculoskeletal:  No acute deformity or ROM limitation Skin:  Grossly intact  LABS:  BMET Recent Labs  Lab 08/19/17 0304 08/20/17 0522 08/21/17 0445  NA 136 137 139  K 3.6 3.5 3.7  CL 106 109 111  CO2 21* 20* 21*  BUN 24* 18 17  CREATININE 0.71 0.70 0.58*  GLUCOSE 135* 110* 122*    Electrolytes Recent Labs  Lab 08/19/17 0304 08/20/17 0522 08/20/17 1656 08/21/17 0445  CALCIUM 8.1* 7.9*  --  7.8*  MG  --  1.8 1.9 1.9  PHOS  --  2.7 3.2 2.4*    CBC Recent Labs  Lab 08/19/17 0926 08/20/17 0522 08/21/17 0445  WBC 12.6* 12.7* 14.0*  HGB 9.9* 9.9* 10.4*  HCT 31.2* 31.4* 32.9*  PLT 207 194 193    Coag's Recent Labs  Lab 08/18/17 2151  APTT 30  INR 1.04    Sepsis Markers No results for input(s): LATICACIDVEN, PROCALCITON, O2SATVEN in the last 168 hours.  ABG Recent Labs  Lab 08/19/17 0345  PHART 7.437  PCO2ART 33.7  PO2ART 406*    Liver Enzymes Recent Labs  Lab 08/18/17 2151  AST 23  ALT 22  ALKPHOS 65  BILITOT 0.8  ALBUMIN 4.0    Cardiac Enzymes No results for input(s): TROPONINI, PROBNP in the last 168 hours.  Glucose Recent Labs  Lab 08/18/17 2152 08/20/17 1629 08/20/17 1955 08/20/17 2318 08/21/17 0326 08/21/17 0759  GLUCAP 143* 112* 116* 132* 117* 153*    Imaging Dg Chest Port 1 View  Result Date: 08/21/2017 CLINICAL DATA:  Endotracheal tube EXAM: PORTABLE CHEST 1 VIEW COMPARISON:  08/19/2017 FINDINGS: Endotracheal tube 2 cm above the carina.  NG tube enters the stomach Progression of bibasilar atelectasis/infiltrate. Negative for heart failure or edema. IMPRESSION: Endotracheal tube remains in  satisfactory position. Progression of bibasilar atelectasis/infiltrate. Electronically Signed   By: Franchot Gallo M.D.   On: 08/21/2017 07:56   Dg Abd Portable 1v  Result Date: 08/21/2017 CLINICAL DATA:  Vomiting EXAM: PORTABLE ABDOMEN - 1 VIEW COMPARISON:  08/19/2017 FINDINGS: NG tube in the stomach. Stool in the right colon. Mildly distended distal transverse colon. Gas in the rectosigmoid. Negative for bowel obstruction. IMPRESSION: NG tube in the stomach.  Mild colonic ileus without obstruction. Electronically Signed   By: Franchot Gallo M.D.   On: 08/21/2017 08:04     STUDIES:  CT head code stroke 1/7> no acute stroke, hemorrhage, or mass-effect identified.  Density in the left M2 superior division. CT Angie of head 1/7> distal left M1 occlusion extending into the left M2 superior  division.  Bilateral paraclinoid ICA stenosis with near occlusion on the right.  CULTURES: Results for orders placed or performed during the hospital encounter of 08/18/17  MRSA PCR Screening     Status: None   Collection Time: 08/19/17  2:33 AM  Result Value Ref Range Status   MRSA by PCR NEGATIVE NEGATIVE Final    Comment:        The GeneXpert MRSA Assay (FDA approved for NASAL specimens only), is one component of a comprehensive MRSA colonization surveillance program. It is not intended to diagnose MRSA infection nor to guide or monitor treatment for MRSA infections.      ANTIBIOTICS: Perioperative cefazolin   SIGNIFICANT EVENTS: 1/7 presented with acute stroke, IV TPA given 1/8 taken to IR for mechanical thrombolyzes.  Recovered in ICU on vent  LINES/TUBES: Endotracheal tube 1/8>  DISCUSSION: 84 year old male presented as code stroke found to have Left MCA CVA. Taken to IR for revascularization. Post-op remained on vent. PCCM asked to see.   ASSESSMENT / PLAN:  PULMONARY A: ASPIRATION PNEUMONIA ENDOTRACHEAL INTUBATION for airway protection. Inability to protect airway in the  postoperative setting  P:   Start Unasyn/vancomycin. On PRVC 12/650/45/5 Ventilator associated pneumonia prevention bundle CXR in am. ABG in am  CARDIOVASCULAR A:  Hypertension Atrial fibrillation with rapid ventricular response Coronary artery disease Internal carotid artery stenosis  P:  ICU hemodynamic monitoring On Cardizem gtt. Watch for bradycardia with initiation of Precedex gtt for sedation BP goals per Dr. Doristine Bosworth team (SBP 120-140) Holding home midodrine Heparin for DVT prophylaxis now. Will need heparinization once safe considering tPA.   RENAL A:   No acute issues  P:   Monitor renal function considering contrast load.   GASTROINTESTINAL A: NONOBSTRUCTIVE ILEUS  Reglan 5 mg IV q 8hr Decrease fentanyl gtt.  P: GI prophylaxis with Protonix  HEMATOLOGIC A:  Mild anemia, at baseline  P:  Follow CBC. Antiplatelet therapy per stroke team.     INFECTIOUS A: ASPIRATION PNEUMONIA P: Unasyn/vancomycin   ENDOCRINE A:   No acute issues  P:   Follow glucose on BMP  NEUROLOGIC A:   Acute CVA Left MCA occlusion.  s/p IR revascularization 1/8 Start DVT prophylaxis (heparin)  P:   Case discussed with Dr. Leonie Man. Change in CODE STATUS to DNR noted.   FAMILY  - Updates: family (son) - Inter-disciplinary family meet or Palliative Care meeting due by:  1/14   Renee Pain, MD [facility] Pulmonology/Critical Care Pager [phone] or [phone]  08/21/2017 11:03 AM

## 2017-08-21 NOTE — Progress Notes (Addendum)
SLP Cancellation Note  Patient Details Name: Thomas Hanna MRN: [id number] DOB: [birth year]   Cancelled treatment:       Reason Eval/Treat Not Completed: Medical issues which prohibited therapy(Patient intubated. Will f/u 1/11. ) Signed off. Please re-consult when appropriate.    [place], CCC-SLP [phone]  Thomas Hanna 08/21/2017, 8:20 AM

## 2017-08-21 NOTE — Progress Notes (Signed)
Pharmacy Antibiotic Note  Thomas Hanna is a 84 y.o. male admitted on 08/18/2017 with a stroke. Pharmacy has been consulted for vancomycin and unasyn dosing for possible pneumonia. Pt is afebrile and WBC is elevated at 14. Scr is WNL.   Plan: Vancomycin 1gm IV Q12H Unasyn 3gm IV Q8H F/u renal fxn, C&S, clinical status and trough at SS  Height: 6\' 2"  (188 cm) Weight: 185 lb 6.5 oz (84.1 kg) IBW/kg (Calculated) : 82.2  Temp (24hrs), Avg:97.8 F (36.6 C), Min:97.5 F (36.4 C), Max:98.4 F (36.9 C)  Recent Labs  Lab 08/18/17 2151 08/18/17 2202 08/19/17 0304 08/19/17 0926 08/20/17 0522 08/21/17 0445  WBC 10.5  --  11.4* 12.6* 12.7* 14.0*  CREATININE 0.76 0.80 0.71  --  0.70 0.58*    Estimated Creatinine Clearance: 69.9 mL/min (A) (by C-G formula based on SCr of 0.58 mg/dL (L)).    Allergies  Allergen Reactions  . Sulfa Antibiotics Other (See Comments)    Unknown    Antimicrobials this admission: Vanc 1/10>> Unasyn 1/10>>  Dose adjustments this admission: N/A  Microbiology results: Pending  Thank you for allowing pharmacy to be a part of this patient's care.  Del Overfelt, Rande Lawman 08/21/2017 11:39 AM

## 2017-08-21 NOTE — Progress Notes (Signed)
NEUROHOSPITALISTS STROKE TEAM - DAILY PROGRESS NOTE   ADMISSION HISTORY: HPI: Thomas Hanna is a 84 y.o. male was a past medical history of coronary artery disease, orthostatic hypotension, prostate cancer, left thalamic stroke with mild residual right hemiparesis since January 2018, who presented to the emergency room as an acute code stroke for sudden onset of right-sided weakness, left-sided gaze preference and a aphasia. But he lives with family.  He was last known normal at 1900 hrs. on 08/18/2017 as witnessed by his son.  Upon checking on him later on that day, he was noted to be completely mute and nonverbal.  He was not able to follow commands.  He had a gaze preference to the left and could not move his right side of the body.  EMS was called.  They assessed him on side and brought him in as an acute code stroke.  Upon initial evaluation performed by me, he was an NIH stroke scale of greater than 20. His symptoms are consistent with a complete left MCA syndrome.  Noncontrast CT of the head was negative for any acute bleed.  He was given IV TPA.  CT angiogram of the head and neck and CT perfusion study were performed which were favorable for intervention for the left distal M1/proximal M2 occlusion.  Neuro endovascular consultation was obtained and the endovascular team was called and he was taken in for thrombectomy.  He is on no blood thinners.  He has had no recent surgeries.  He has no recent strokes.  Son was called over the phone and all the history was obtained from him as much as possible during the acute situation over the phone.  Son and another family member was then met at bedside prior to getting patient in for the endovascular procedure.  They were appraised of the situation and all questions were answered.  Of note, patient was hypertensive with systolic blood pressure in the 902I and diastolics over 097 on arrival.  Mild delays  in administering IV TPA due to blood pressure being out of parameters and use of IV labetalol to control blood pressures.  LKW: 1900 hrs. on 08/18/2017 tpa given?:  Yes Premorbid modified Rankin scale (mRS): 1 NIHSS 1a Level of Conscious.: 0 1b LOC Questions: 2 1c LOC Commands: 2 2 Best Gaze: 1 3 Visual: 2 4 Facial Palsy: 2 5a Motor Arm - left: 0 5b Motor Arm - Right: 4 6a Motor Leg - Left: 2 6b Motor Leg - Right: 3 7 Limb Ataxia: 0 8 Sensory: 2 9 Best Language: 3 10 Dysarthria: 2 11 Extinct. and Inatten.: 2 TOTAL: 27  SUBJECTIVE (INTERVAL HISTORY)  His son and daughter in law are  at not at the bedside. Patient is found laying in bed in NAD, intubated and sedated. He vomited early this morning and has been agitated and in atrial fibrillation with rapid heart rate. He has not been able to wean off ventilator     OBJECTIVE Lab Results: CBC:  Recent Labs  Lab 08/19/17 0926 08/20/17 0522 08/21/17 0445  WBC 12.6* 12.7* 14.0*  HGB 9.9* 9.9* 10.4*  HCT 31.2* 31.4* 32.9*  MCV 90.4 91.3 94.5  PLT 207 194  193   BMP: Recent Labs  Lab 08/18/17 2151 08/18/17 2202 08/19/17 0304 08/20/17 0522 08/20/17 1656 08/21/17 0445  NA 135 139 136 137  --  139  K 3.8 3.9 3.6 3.5  --  3.7  CL 103 102 106 109  --  111  CO2 26  --  21* 20*  --  21*  GLUCOSE 147* 142* 135* 110*  --  122*  BUN 28* 27* 24* 18  --  17  CREATININE 0.76 0.80 0.71 0.70  --  0.58*  CALCIUM 8.9  --  8.1* 7.9*  --  7.8*  MG  --   --   --  1.8 1.9 1.9  PHOS  --   --   --  2.7 3.2 2.4*   Liver Function Tests:  Recent Labs  Lab 08/18/17 2151  AST 23  ALT 22  ALKPHOS 65  BILITOT 0.8  PROT 6.7  ALBUMIN 4.0   Coagulation Studies:  Recent Labs    08/18/17 2151  APTT 30  INR 1.04   PHYSICAL EXAM Temp:  [97.6 F (36.4 C)-98.4 F (36.9 C)] 98.4 F (36.9 C) (01/10 1141) Pulse Rate:  [53-159] 92 (01/10 1330) Resp:  [12-25] 18 (01/10 1330) BP: (74-191)/(45-146) 120/63 (01/10 1330) SpO2:  [95 %-100 %]  100 % (01/10 1330) FiO2 (%):  [40 %] 40 % (01/10 1300) Weight:  [185 lb 6.5 oz (84.1 kg)] 185 lb 6.5 oz (84.1 kg) (01/10 0346) General - Well nourished, well developed, in no apparent distress, Intubated and sedated HEENT-  Normocephalic, Normal external eye/conjunctiva.  Normal external ears. Normal external nose, mucus membranes and septum.   Cardiovascular - Regular rate and rhythm  Respiratory - Lungs clear bilaterally. No wheezing. Abdomen - soft and non-tender, BS normal Extremities- no edema or cyanosis NEURO:  Mental Status: Intubated and sedated. Will open eyes brief when name called loudly. Aphasic. Does not follow any commands. Cranial Nerves: PERRL gaze preference to the left, slight hypertropia of both eyes unable to cross the midline,, visual fields.  Right homonymous hemianopsia as evidenced by no blink to threat from that site,, right lower facial weakness Motor: 0/5 right upper extremity, minimal withdrawal to noxious stimulus in the right lower extremity, 5/5 left upper extremity with no drift, no drift in the left lower extremity but unable to hold above the bed secondary to inability to follow commands. Tone: is normal and bulk is normal Sensation-grossly reduced sensation on the right hemibody to noxious edematous. Coordination: Unable to test Gait- deferred  IMAGING: I have personally reviewed the radiological images below and agree with the radiology interpretations.  Ct Head Wo Contrast Result Date: 08/19/2017 IMPRESSION: Loss of gray-white differentiation within the insula and frontal operculum compatible with evolving acute infarction. No additional area of infarction identified at this time. No intracranial hemorrhage or mass effect.  CTA Head/Neck and Cerebral Perfusion W Contrast Result Date: 08/18/2017 IMPRESSION: CTA neck: 1. Patent carotid and vertebral arteries of the neck. 2. Dense calcified plaque of bilateral carotid bifurcations with proximal ICA moderate  60-70% stenosis. 3. Moderate stenosis of right vertebral artery origin. CTA head: 1. Distal left M1 occlusion extending into left M2 superior division. Early reconstitution of left M2 inferior division via collateralization. Poor downstream collateralization and left superior MCA distribution. 2. Left paraclinoid ICA severe stenosis. 3. Right paraclinoid and terminal ICA occlusion or near occlusion. Contrast opacification of the right upper cervical ICA diminishes gradually to the terminal segment, severe stenosis was present on  the prior study. Findings may represent chronic severe stenosis with poor antegrade flow or interval thrombosis. CT brain perfusion: 1. Core infarct volume of 71 cc. 2. Mismatch ischemic penumbra of 102 cc. 3. Infarct location left superior MCA distribution.   Portable Chest Xray Result Date: 08/19/2017 IMPRESSION: Endotracheal tube 4.8 cm from carina. Aortic atherosclerosis. No acute pulmonary process identified.   Dg Abd Portable 1v Result Date: 08/19/2017 IMPRESSION: The esophagogastric tube tip and proximal port of appear to lie in the stomach.   Ct Head Code Stroke Wo Contrast Result Date: 08/18/2017 IMPRESSION: 1. No acute stroke, hemorrhage, or mass effect identified. 2. Density in left M2 superior division, possible thrombus. 3. ASPECTS is 10 4. Stable chronic microvascular ischemic changes and parenchymal volume loss of the brain.   Echocardiogram:                                               Study Conclusions - Left ventricle: Posterior basal hypokinesis. Wall thickness was   increased in a pattern of mild LVH. The estimated ejection   fraction was 55%. The study is not technically sufficient to   allow evaluation of LV diastolic function. - Mitral valve: Moderately calcified annulus. Moderately thickened,   moderately calcified leaflets . There was mild regurgitation. - Left atrium: The atrium was mildly dilated. - Atrial septum: No defect or patent foramen ovale  was identified.   MRI BRAIN:                                                PENDING    IMPRESSION: Thomas Hanna is a 84 y.o. male with PMH of coronary artery disease, orthostatic hypotension, prostate cancer, left thalamic stroke with mild residual right hemiparesis since January 2018, who presented to the emergency room as an acute code stroke for sudden onset of right-sided weakness, left-sided gaze preference and a aphasia with an NIH stroke scale of 27. Noncontrast CT of the head did not show any bleed.  IV TPA was administered after reviewing contraindications with the son over the phone. Head and neck vessel imaging reveals left M1/M2 superior division occlusion.  Deemed to be a good candidate for endovascular treatment given less than 6 hours from presentation and a favorable perfusion profile on the CT perfusion as well. Neuro endovascular was consulted and the patient was taken for IAT.   Acute ischemic strokes in multiple vascular distributions status post IV TPA and mechanical thrombectomy Suspected Etiology: likely cardio embolic given new onset AFIB   Resultant Symptoms:  right-sided weakness, left-sided gaze preference and a aphasia. Stroke Risk Factors: hyperlipidemia and hypertension, AFIB Other Stroke Risk Factors: Advanced age, Hx stroke, CAD, Prostate Cancer, Hx of MI  PROCEDURES:  08/19/2017  Dr Estanislado Pandy S/P Lt common carotid arteriogram followed by revascularization of Lt MCA distal M1 and mid perisylvian M3 branch  with mechanical thrombolysis achieving a TICI 2 b reperfusion.   08/21/2017 ASSESSMENT:   Remains intubated and sedated. Aphasic, not following commands. Moving LUE spontaneously and occasionally LLE. No spontaneous movement of Right side. +withdrawal to pain. No family at bedside.  PLAN  08/21/2017: Start ASA . [facility] consultation for new onset AFIB CCM to attempt extubation when medically ready  Maintain strict B/P parameters Frequent  neuro checks Telemetry monitoring PT/OT/SLP Consult PM & Rehab Consult Case Management /MSW Ongoing aggressive stroke risk factor management Patient will be counseled to be compliant with his antithrombotic medications Patient will be counseled on Lifestyle modifications including, Diet, Exercise, and Stress Follow up with [facility] in 6 weeks  HX OF STROKES: Left thalamic stroke with mild residual right hemiparesis since January 2018  INTRACRANIAL Atherosclerosis &Stenosis: May consider DAPT if appropriate at discharge  DYSPHAGIA: NPO until passes SLP swallow evaluation Aspiration Precautions in progress  AFIB, NEW ONSET: EKG shows A. Fib, episodes of Bradycardia overnight Cardiology Consultation, Appreciate assistance Continue to HOLD [facility] - for now Will need long-term anticoagulation once safe from a stroke standpoint, if appropriate Repeat CT in 2 weeks for consideration of starting anticoagulation if CT is stable.   MEDICAL ISSUES: CCM following, appreciate assistance Inability to protect airway in the postoperative setting Remains intubated/sedated Wean Fentanyl and SBT's   Mild anemia, at baseline  Hgb 9.9 / Hct 31.2 - stable this morning Repeat CBC in AM  HYPERTENSION: Change blood pressure goal below 110 2180. Wean and taper norepinephrine drip. Discontinue arterial line Long term BP goal normotensive. Home Meds: NONE  HYPERLIPIDEMIA:    Component Value Date/Time   CHOL 140 08/19/2017 0304   TRIG 86 08/19/2017 0304   HDL 36 (L) 08/19/2017 0304   CHOLHDL 3.9 08/19/2017 0304   VLDL 17 08/19/2017 0304   LDLCALC 87 08/19/2017 0304  Home Meds:  Lovaza LDL  goal < 70 Will continued Lovaza and start Lipitor to 10 mg daily, if passes SLP evaluation Continue statin at discharge, if appropriate  R/O DIABETES: Lab Results  Component Value Date   HGBA1C 5.7 (H) 08/19/2017  HgbA1c goal < 7.0  Other Active Problems: Principal Problem:   Acute  ischemic left MCA stroke (HCC) Active Problems:   Coronary artery disease due to lipid rich plaque   Normocytic anemia   Pressure injury of skin   Atrial fibrillation with rapid ventricular response (HCC)   Bilateral carotid artery stenosis   Aspiration pneumonia (HCC)   Ileus (HCC)   Endotracheally intubated    Hospital day # 3 VTE prophylaxis: SCD's  Diet : Diet NPO time specified   FAMILY UPDATES: No family at bedside  TEAM UPDATES: Garvin Fila, MD STATUS:  FULL   Prior Home Stroke Medications:  clopidogrel 75 mg daily  Discharge Stroke Meds:  Please discharge patient on TBD   Disposition: 01-Home or Self Care Therapy Recs:               PENDING Home Equipment:         PENDING Follow Up:  Follow-up Information    Garvin Fila, MD. Schedule an appointment as soon as possible for a visit in 6 week(s).   Specialties:  Neurology, Radiology Contact information: [address] [place] [address] [place] Not In -PCP Follow up in 1-2 weeks   Case Management aware of need     I have personally examined this patient, reviewed notes, independently viewed imaging studies, participated in medical decision making and plan of care.ROS completed by me personally and pertinent positives fully documented  I have made any additions or clarifications directly to the above note.  . Patient has presented with a large left MCA infarcts due to new onset atrial fibrillation and underwent treatment with IV  TPA and mechanical thrombectomy with partial recanalization. MRI shows multiple embolic strokes in different vascular distributions Prognosis is guarded. Plan  Family not available at bedside today for discussion. Discussed with Dr. Carson Myrtle from pulmonary critical care medicine. Continue medical support for now for a few days and decision on extubation only after  discussion with family and goals of care about reintubation versus tracheostomy and  CODE STATUS discussion. Discussed with Dr.Jeong from critical care medicine This patient is critically ill and at significant risk of neurological worsening, death and care requires constant monitoring of vital signs, hemodynamics,respiratory and cardiac monitoring, extensive review of multiple databases, frequent neurological assessment, discussion with family, other specialists and medical decision making of high complexity.I have made any additions or clarifications directly to the above note.This critical care time does not reflect procedure time, or teaching time or supervisory time of PA/NP/Med Resident etc but could involve care discussion time.  I spent 30 minutes of neurocritical care time  in the care of  this patient.      Antony Contras, MD Medical Director [facility] Pager: [phone] 08/21/2017 2:39 PM  To contact Stroke Continuity provider, please refer to [email or web address] After hours, contact General Neurology

## 2017-08-21 NOTE — Progress Notes (Signed)
RT note-HR elevated, RR set increased back to 12 as ordered, no weaning at this time.

## 2017-08-21 NOTE — Progress Notes (Addendum)
Progress Note  Patient Name: Thomas Hanna Date of Encounter: 08/21/2017  Primary Cardiologist: Shelva Majestic, MD   Subjective   Unable to obtain.  Intubated and sedated.   Inpatient Medications    Scheduled Meds: . aspirin  81 mg Per Tube Daily  . chlorhexidine gluconate (MEDLINE KIT)  15 mL Mouth Rinse BID  . diltiazem  10 mg Intravenous Once  . mouth rinse  15 mL Mouth Rinse 10 times per day  . pantoprazole (PROTONIX) IV  40 mg Intravenous Daily   Continuous Infusions: . sodium chloride    . sodium chloride 75 mL/hr at 08/21/17 0700  . diltiazem (CARDIZEM) infusion    . feeding supplement (VITAL AF 1.2 CAL) Stopped (08/21/17 0500)  . fentaNYL infusion INTRAVENOUS 200 mcg/hr (08/21/17 0700)  . phenylephrine (NEO-SYNEPHRINE) Adult infusion Stopped (08/20/17 1000)   PRN Meds: acetaminophen **OR** acetaminophen (TYLENOL) oral liquid 160 mg/5 mL **OR** acetaminophen, fentaNYL (SUBLIMAZE) injection, fentaNYL (SUBLIMAZE) injection, ondansetron (ZOFRAN) IV, senna-docusate   Vital Signs    Vitals:   08/21/17 0700 08/21/17 0800 08/21/17 0811 08/21/17 0845  BP: 139/81 (!) 150/102    Pulse: 73 96    Resp: 12 14    Temp:  98.4 F (36.9 C)    TempSrc:      SpO2: 100% 100% 100% 99%  Weight:      Height:        Intake/Output Summary (Last 24 hours) at 08/21/2017 0929 Last data filed at 08/21/2017 0900 Gross per 24 hour  Intake 3223.5 ml  Output 1160 ml  Net 2063.5 ml   Filed Weights   08/18/17 2100 08/19/17 0300 08/21/17 0346  Weight: 182 lb 12.2 oz (82.9 kg) 177 lb 4 oz (80.4 kg) 185 lb 6.5 oz (84.1 kg)    Telemetry    Atrial fibrillation.  70s-140s.  - Personally Reviewed  ECG    N/a - Personally Reviewed  Physical Exam   VS:  BP (!) 150/102 (BP Location: Right Arm)   Pulse 96   Temp 98.4 F (36.9 C)   Resp 14   Ht _0  (1.88 m)   Wt 185 lb 6.5 oz (84.1 kg)   SpO2 99%   BMI 23.80 kg/m  , BMI Body mass index is 23.8 kg/m. GENERAL:  Critically  ill-appearing.  Intubated. HEENT: Pupils equal round and reactive, fundi not visualized, oral mucosa unremarkable NECK:  No jugular venous distention, waveform within normal limits, carotid upstroke brisk and symmetric, no bruits LUNGS:  Vented breath sounds.  HEART:  Irregularly irregular.  PMI not displaced or sustained,S1 and S2 within normal limits, no S3, no S4, no clicks, no rubs, no murmurs ABD:  Mildly distended.  Hypoactive BS. no bruits, no rebound, no guarding, no midline pulsatile mass, no hepatomegaly, no splenomegaly EXT:  2 plus pulses throughout, trace edema and ankel, no cyanosis no clubbing SKIN:  No rashes no nodules NEURO:  R hemiparesis.   PSYCH:  Unable to obtain. Intubated and sedated.   Labs    Chemistry Recent Labs  Lab 08/18/17 2151  08/19/17 0304 08/20/17 0522 08/21/17 0445  NA 135   < > 136 137 139  K 3.8   < > 3.6 3.5 3.7  CL 103   < > 106 109 111  CO2 26  --  21* 20* 21*  GLUCOSE 147*   < > 135* 110* 122*  BUN 28*   < > 24* 18 17  CREATININE 0.76   < > 0.71  0.70 0.58*  CALCIUM 8.9  --  8.1* 7.9* 7.8*  PROT 6.7  --   --   --   --   ALBUMIN 4.0  --   --   --   --   AST 23  --   --   --   --   ALT 22  --   --   --   --   ALKPHOS 65  --   --   --   --   BILITOT 0.8  --   --   --   --   GFRNONAA >60  --  >60 >60 >60  GFRAA >60  --  >60 >60 >60  ANIONGAP 6  --  _0 < > = values in this interval not displayed.     Hematology Recent Labs  Lab 08/19/17 0926 08/20/17 0522 08/21/17 0445  WBC 12.6* 12.7* 14.0*  RBC 3.45* 3.44* 3.48*  HGB 9.9* 9.9* 10.4*  HCT 31.2* 31.4* 32.9*  MCV 90.4 91.3 94.5  MCH 28.7 28.8 29.9  MCHC 31.7 31.5 31.6  RDW 13.6 14.0 14.4  PLT 207 194 193    Cardiac EnzymesNo results for input(s): TROPONINI in the last 168 hours.  Recent Labs  Lab 08/18/17 2202  TROPIPOC 0.01     BNPNo results for input(s): BNP, PROBNP in the last 168 hours.   DDimer No results for input(s): DDIMER in the last 168 hours.    Radiology    [name]  Result Date: 08/19/2017 CLINICAL DATA:  Follow-up examination for acute stroke. Status post tPA with catheter directed thrombectomy. EXAM: MRI HEAD WITHOUT CONTRAST TECHNIQUE: Multiplanar, multiecho pulse sequences of the brain and surrounding structures were obtained without intravenous contrast. COMPARISON:  Prior CTs from earlier the same day as well as 08/18/2017. FINDINGS: Brain: Cerebral volume normal. Mild chronic microvascular ischemic changes present within the periventricular white matter. Patchy multifocal left MCA territory infarcts seen involving the left frontal and parietal lobes, with involvement of the left insula and left frontal operculum. Patchy involvement within the left periatrial white matter. Confluent restricted diffusion at the parasagittal left frontal parietal region consistent with left ACA territory infarct and/or watershed infarct. There are additional patchy multifocal cortical infarcts involving the right parietal lobe and posterior insular region. Patchy infarct within the right periatrial white matter. Small 1 cm acute left cerebellar infarct noted as well. No associated mass effect. Associated petechial hemorrhage at the level of the left frontal operculum without frank hemorrhagic transformation (series 8, image 17). No mass lesion, midline shift, or mass effect. No hydrocephalus. No extra-axial fluid collection. Pituitary gland suprasellar region normal. Midline structures intact and normal. Vascular: Major intracranial vascular flow voids are maintained at the skull base. Skull and upper cervical spine: Craniocervical junction normal. Upper cervical spine normal. Bone marrow signal intensity within normal limits. No scalp soft tissue abnormality. Sinuses/Orbits: Globes and orbital soft tissues demonstrate no acute abnormality. Patient status post cataract extraction bilaterally. Subtle asymmetric thickening at the left posterolateral  sclerotic noted, which may be postoperative in nature. Scattered mucosal thickening throughout the paranasal sinuses. No air-fluid level to suggest acute sinusitis. Mastoid air cells are largely clear. Inner ear structures normal. Other: None IMPRESSION: 1. Patchy multifocal acute ischemic infarcts involving the bilateral cerebral hemispheres, with additional 1 cm left cerebellar infarct as above. Associated petechial hemorrhage at the left frontal operculum without hemorrhagic transformation. 2. Underlying mild chronic microvascular ischemic disease. Electronically Signed  By: Jeannine Boga M.D.   On: 08/19/2017 17:17   Dg Chest Port 1 View  Result Date: 08/21/2017 CLINICAL DATA:  Endotracheal tube EXAM: PORTABLE CHEST 1 VIEW COMPARISON:  08/19/2017 FINDINGS: Endotracheal tube 2 cm above the carina.  NG tube enters the stomach Progression of bibasilar atelectasis/infiltrate. Negative for heart failure or edema. IMPRESSION: Endotracheal tube remains in satisfactory position. Progression of bibasilar atelectasis/infiltrate. Electronically Signed   By: Franchot Gallo M.D.   On: 08/21/2017 07:56   Dg Abd Portable 1v  Result Date: 08/21/2017 CLINICAL DATA:  Vomiting EXAM: PORTABLE ABDOMEN - 1 VIEW COMPARISON:  08/19/2017 FINDINGS: NG tube in the stomach. Stool in the right colon. Mildly distended distal transverse colon. Gas in the rectosigmoid. Negative for bowel obstruction. IMPRESSION: NG tube in the stomach.  Mild colonic ileus without obstruction. Electronically Signed   By: Franchot Gallo M.D.   On: 08/21/2017 08:04    Cardiac Studies   Echo 08/19/17: Study Conclusions  - Left ventricle: Posterior basal hypokinesis. Wall thickness was   increased in a pattern of mild LVH. The estimated ejection   fraction was 55%. The study is not technically sufficient to   allow evaluation of LV diastolic function. - Mitral valve: Moderately calcified annulus. Moderately thickened,   moderately  calcified leaflets . There was mild regurgitation. - Left atrium: The atrium was mildly dilated. - Atrial septum: No defect or patent foramen ovale was identified.  Patient Profile     Mr. Tamura is a 38M with CAD s/p CABG and PCI, prior stroke, orthostatic hypotension and prostate cancer here with large L MCA stroke.  Assessment & Plan    # L MCA stroke: Embolic stroke in the setting of newly diagnosed atrial fibrillation.  R hemiparesis.  Patient has been made DNR.  He remains on the ventilator and family has elected to try and extubate him when ready.    # Paroxysmal atrial fibrillation: Patient was well-rate controlled until 3am on 08/21/17.  Nurse reports that he had more stimulation this AM and has emesis. He is hemodynamically stable but rates remain in the 110s-140s this am.  WIll start diltiazem drip.  Goal HR <100 bpm.  Start anticoagulation when safe per Neurology.   # CAD s/p CABG and PCI: Not an active issue.  Continue aspirin.    # Ileus: Non-obstructive on KUB this AM.  No BM since admission and hypoactive bowel sounds.  Avoid OG meds.   Time spent: 40 minutes-Greater than 50% of this time was spent in counseling, explanation of diagnosis, planning of further management, and coordination of care.   For questions or updates, please contact [facility] Please consult [email or web address] for contact info under Cardiology/STEMI.      Signed, Skeet Latch, MD  08/21/2017, 9:29 AM

## 2017-08-22 ENCOUNTER — Inpatient Hospital Stay (HOSPITAL_COMMUNITY): Payer: Medicare Other

## 2017-08-22 ENCOUNTER — Encounter (HOSPITAL_COMMUNITY): Payer: Self-pay | Admitting: Interventional Radiology

## 2017-08-22 DIAGNOSIS — E876 Hypokalemia: Secondary | ICD-10-CM | POA: Diagnosis not present

## 2017-08-22 DIAGNOSIS — I10 Essential (primary) hypertension: Secondary | ICD-10-CM | POA: Diagnosis present

## 2017-08-22 LAB — CBC
HCT: 24.7 % — ABNORMAL LOW (ref 39.0–52.0)
Hemoglobin: 7.9 g/dL — ABNORMAL LOW (ref 13.0–17.0)
MCH: 30.2 pg (ref 26.0–34.0)
MCHC: 32 g/dL (ref 30.0–36.0)
MCV: 94.3 fL (ref 78.0–100.0)
PLATELETS: 132 10*3/uL — AB (ref 150–400)
RBC: 2.62 MIL/uL — ABNORMAL LOW (ref 4.22–5.81)
RDW: 14.4 % (ref 11.5–15.5)
WBC: 11.1 10*3/uL — ABNORMAL HIGH (ref 4.0–10.5)

## 2017-08-22 LAB — BASIC METABOLIC PANEL
Anion gap: 6 (ref 5–15)
BUN: 20 mg/dL (ref 6–20)
CALCIUM: 6.4 mg/dL — AB (ref 8.9–10.3)
CO2: 16 mmol/L — ABNORMAL LOW (ref 22–32)
CREATININE: 0.62 mg/dL (ref 0.61–1.24)
Chloride: 118 mmol/L — ABNORMAL HIGH (ref 101–111)
Glucose, Bld: 133 mg/dL — ABNORMAL HIGH (ref 65–99)
Potassium: 3.3 mmol/L — ABNORMAL LOW (ref 3.5–5.1)
SODIUM: 140 mmol/L (ref 135–145)

## 2017-08-22 LAB — GLUCOSE, CAPILLARY
GLUCOSE-CAPILLARY: 133 mg/dL — AB (ref 65–99)
GLUCOSE-CAPILLARY: 162 mg/dL — AB (ref 65–99)
Glucose-Capillary: 114 mg/dL — ABNORMAL HIGH (ref 65–99)
Glucose-Capillary: 143 mg/dL — ABNORMAL HIGH (ref 65–99)
Glucose-Capillary: 146 mg/dL — ABNORMAL HIGH (ref 65–99)
Glucose-Capillary: 158 mg/dL — ABNORMAL HIGH (ref 65–99)
Glucose-Capillary: 163 mg/dL — ABNORMAL HIGH (ref 65–99)

## 2017-08-22 LAB — PHOSPHORUS: PHOSPHORUS: 1.7 mg/dL — AB (ref 2.5–4.6)

## 2017-08-22 LAB — HEMOGLOBIN AND HEMATOCRIT, BLOOD
HCT: 24 % — ABNORMAL LOW (ref 39.0–52.0)
Hemoglobin: 7.7 g/dL — ABNORMAL LOW (ref 13.0–17.0)

## 2017-08-22 LAB — MAGNESIUM: MAGNESIUM: 1.9 mg/dL (ref 1.7–2.4)

## 2017-08-22 NOTE — Progress Notes (Addendum)
PULMONARY / CRITICAL CARE MEDICINE   Name: Thomas Hanna MRN: [id number] DOB: 12-27-1923    ADMISSION DATE:  08/18/2017 CONSULTATION DATE: August 19, 2017  REFERRING MD: Dr. Leonie Man  CHIEF COMPLAINT: Stroke   SUBJECTIVE:   - Interim events: had another bout of emesis last night. Off sedation. On PSV 10/5. Respiratory mechanics look good but not following commands. Patient is obtunded. Arousable to tactile stimuli. Follows some commands. Much less interactive today. Green pea colored secretions from ETT circuit. Started on Cardizem gtt this am for rapid ventricular response.  HISTORY OF PRESENT ILLNESS:   This 84 year old male is seen in consultation at the request of Dr. Leonie Man previous left thalamic stroke with mild residual right hemiparesis(January 2018) presented to [facility] emergency department on 1/7 with complaints of right-sided weakness, left-sided gaze preference, and aphasia.  Last known well at 1900 on 1/7.  He presented to the emergency department as a code stroke and was given IV tPA after noncontrast CT was negative for acute bleed.  Further cerebral imaging demonstrated left distal M1/proximal M2 occlusion.  He was taken to interventional radiology and underwent mechanical thrombolysis.  Post procedurally he was sent to the ICU for recovery and remained on the ventilator.  PCCM asked to consult.  PAST MEDICAL HISTORY :  He  has a past medical history of Coronary artery disease, MI (myocardial infarction) ([place]), Orthostatic hypotension (08/2016), Prostate cancer ([place]), and Stroke ([place]).   Allergies  Allergen Reactions  . Sulfa Antibiotics Other (See Comments)    Unknown    No current facility-administered medications on file prior to encounter.    Current Outpatient Medications on File Prior to Encounter  Medication Sig  . Ascorbic Acid (VITAMIN C) 100 MG tablet Take 100 mg by mouth daily.  . cholecalciferol (VITAMIN D) 1000 units tablet Take 1,000 Units by mouth  daily.  . clopidogrel (PLAVIX) 75 MG tablet Take 75 mg by mouth daily.  . dorzolamide (TRUSOPT) 2 % ophthalmic solution Place 1 drop into both eyes 2 (two) times daily.  . ferrous sulfate 325 (65 FE) MG tablet Take 325 mg by mouth every morning.   . folic acid (FOLVITE) 852 MCG tablet Take 400 mcg by mouth daily.  Marland Kitchen gabapentin (NEURONTIN) 100 MG capsule Take 300-600 mg by mouth See admin instructions. Pt takes 600 mg in the morning and noon and 300 mg at bedtime  . Ginkgo Biloba (GINKOBA PO) Take 1 capsule by mouth daily.  Marland Kitchen latanoprost (XALATAN) 0.005 % ophthalmic solution Place 1 drop into both eyes at bedtime.   . midodrine (PROAMATINE) 5 MG tablet Take 0.5 tablets (2.5 mg total) by mouth 2 (two) times daily with a meal.  . omega-3 acid ethyl esters (LOVAZA) 1 g capsule Take 1 g by mouth 2 (two) times daily.  . tamsulosin (FLOMAX) 0.4 MG CAPS capsule Take 0.4 mg by mouth daily after supper.   . vitamin B-12 (CYANOCOBALAMIN) 100 MCG tablet Take 100 mcg by mouth daily.    OBJECTIVE:  VITAL SIGNS: BP (!) 151/83   Pulse 92   Temp 99.2 F (37.3 C) (Axillary)   Resp (!) 22   Ht 6\' 2"  (1.88 m)   Wt 88.9 kg (195 lb 15.8 oz)   SpO2 98%   BMI 25.16 kg/m   VENTILATOR SETTINGS: Vent Mode: PSV;CPAP FiO2 (%):  [40 %] 40 % Set Rate:  [12 bmp] 12 bmp Vt Set:  [650 mL] 650 mL PEEP:  [5 cmH20] 5 cmH20 Pressure Support:  [  [place] Pressure:  [19 cmH20-22 cmH20] 22 cmH20  INTAKE / OUTPUT: I/O last 3 completed shifts: In: 5447.4 [I.V.:3747.4; NG/GT:600; IV Piggyback:1100] Out: 7793 [Urine:1175; Emesis/NG output:400]  PHYSICAL EXAMINATION: General:  Intubated and sedated. Neuro:  Sedated HEENT:  [place]/AT, PERRL, no JVD Cardiovascular:  IRIR, no MRG Lungs:  Clear Abdomen:  Soft, non-distended Musculoskeletal:  No acute deformity or ROM limitation Skin:  Grossly intact  LABS:  BMET Recent Labs  Lab 08/20/17 0522 08/21/17 0445 08/22/17 0535  NA 137 139 140  K 3.5  3.7 3.3*  CL 109 111 118*  CO2 20* 21* 16*  BUN 18 17 20   CREATININE 0.70 0.58* 0.62  GLUCOSE 110* 122* 133*    Electrolytes Recent Labs  Lab 08/20/17 0522  08/21/17 0445 08/21/17 1712 08/22/17 0535  CALCIUM 7.9*  --  7.8*  --  6.4*  MG 1.8   < > 1.9 2.4 1.9  PHOS 2.7   < > 2.4* 2.0* 1.7*   < > = values in this interval not displayed.    CBC Recent Labs  Lab 08/20/17 0522 08/21/17 0445 08/22/17 0535  WBC 12.7* 14.0* 11.1*  HGB 9.9* 10.4* 7.9*  HCT 31.4* 32.9* 24.7*  PLT 194 193 132*    Coag's Recent Labs  Lab 08/18/17 2151  APTT 30  INR 1.04    Sepsis Markers No results for input(s): LATICACIDVEN, PROCALCITON, O2SATVEN in the last 168 hours.  ABG Recent Labs  Lab 08/19/17 0345  PHART 7.437  PCO2ART 33.7  PO2ART 406*    Liver Enzymes Recent Labs  Lab 08/18/17 2151  AST 23  ALT 22  ALKPHOS 65  BILITOT 0.8  ALBUMIN 4.0    Cardiac Enzymes No results for input(s): TROPONINI, PROBNP in the last 168 hours.  Glucose Recent Labs  Lab 08/21/17 1138 08/21/17 1528 08/21/17 1932 08/22/17 0010 08/22/17 0440 08/22/17 0837  GLUCAP 153* 161* 163* 163* 158* 162*    Imaging Dg Chest Port 1 View  Result Date: 08/22/2017 CLINICAL DATA:  Respiratory difficulty EXAM: PORTABLE CHEST 1 VIEW COMPARISON:  08/21/2017 FINDINGS: Endotracheal and NG tubes are stable. The heart is moderately enlarged. Central basilar hazy airspace disease is stable. No pneumothorax. No pleural effusion. IMPRESSION: Stable hazy bilateral airspace disease. Electronically Signed   By: Marybelle Killings M.D.   On: 08/22/2017 09:12    STUDIES:  CT head code stroke 1/7> no acute stroke, hemorrhage, or mass-effect identified.  Density in the left M2 superior division. CT Angie of head 1/7> distal left M1 occlusion extending into the left M2 superior division.  Bilateral paraclinoid ICA stenosis with near occlusion on the right.  CULTURES: Results for orders placed or performed during the  hospital encounter of 08/18/17  MRSA PCR Screening     Status: None   Collection Time: 08/19/17  2:33 AM  Result Value Ref Range Status   MRSA by PCR NEGATIVE NEGATIVE Final    Comment:        The GeneXpert MRSA Assay (FDA approved for NASAL specimens only), is one component of a comprehensive MRSA colonization surveillance program. It is not intended to diagnose MRSA infection nor to guide or monitor treatment for MRSA infections.     ANTIBIOTICS: Perioperative cefazolin   SIGNIFICANT EVENTS: 1/7 presented with acute stroke, IV TPA given 1/8 taken to IR for mechanical thrombolysis.  Recovered in ICU on vent  LINES/TUBES: Endotracheal tube 1/8>  DISCUSSION: 84 year old male presented as code stroke found to  have Left MCA CVA. Taken to IR for revascularization. Post-op remained on vent. PCCM asked to see.   ASSESSMENT / PLAN:  PULMONARY A: ASPIRATION PNEUMONIA ENDOTRACHEAL INTUBATION for airway protection. Inability to protect airway in the postoperative setting P: Start Unasyn/vancomycin. On PRVC 12/650/45/5 Ventilator associated pneumonia prevention bundle CXR in am. ABG in am  CARDIOVASCULAR A: Hypertension Atrial fibrillation with rapid ventricular response Coronary artery disease Internal carotid artery stenosis P: ICU hemodynamic monitoring On Cardizem gtt. Watch for bradycardia with initiation of Precedex gtt for sedation BP goals per Dr. Doristine Bosworth team (SBP 120-140) Holding home midodrine Heparin for DVT prophylaxis now. Will need heparinization once safe considering tPA.   RENAL A: No acute issues P: Monitor renal function considering contrast load.   GASTROINTESTINAL A: NONOBSTRUCTIVE ILEUS  P: GI prophylaxis with Protonix Continue Reglan.  HEMATOLOGIC A:  Mild anemia, Hgb 10.4-->7.9 P:  Gastroccult/Hemoccult Follow H/H Stop heparin. Antiplatelet therapy per stroke team.     INFECTIOUS A: ASPIRATION PNEUMONIA P:  Unasyn/vancomycin   ENDOCRINE/METABOLIC A:  hypokalemia P:  Replete potassium Follow glucose on BMP   NEUROLOGIC A:  Acute CVA Left MCA occlusion.  s/p IR revascularization 1/8 P:  SCDs. Stop DVT chemoprophylaxis (heparin) Case discussed with Dr. Leonie Man.  FAMILY  - Updates: family (son) - Inter-disciplinary family meet or Palliative Care meeting due by:  1/14   Renee Pain, MD [facility] Pager [phone] or [phone]  08/22/2017 10:00 AM

## 2017-08-22 NOTE — Progress Notes (Signed)
Physical Therapy Treatment Patient Details Name: Thomas Hanna MRN: [id number] DOB: [birth year] Today's Date: 08/22/2017    History of Present Illness 84 y.o. male with PMHx: CAD, orthostatic hypotension, prostate cancer, left thalamic stroke with mild residual right hemiparesis since January 2018, who presented for sudden onset of right-sided weakness, left-sided gaze preference and aphasia. with complete L MCA CVA s/p tPA, VDRF    PT Comments    Pt lethargic throughout session, unable to open eyes, follow commands or be aroused even with sitting in full chair position with feet on floor. Pt with constant movement of LUE moving to and from face without purposeful intent. Pt with assist for all mobility, balance and transfers. Will follow with decreased frequency to see if pt able to participate.     Follow Up Recommendations  SNF;Supervision/Assistance - 24 hour     Equipment Recommendations  Hospital bed;Wheelchair (measurements PT);Wheelchair cushion (measurements PT);Other (comment)(hoyer lift)    Recommendations for Other Services       Precautions / Restrictions Precautions Precautions: Fall;Other (comment) Precaution Comments: ETT, Vent    Mobility  Bed Mobility Overal bed mobility: Needs Assistance Bed Mobility: Supine to Sit;Sit to Supine           General bed mobility comments: use of chair position with foot egress to sit pt in full upright with pt not engaging trunk to assist with anterior translation or balance, LUE held due to pt with constant pushing and pulling of LUE unable to follow commands and reaching toward face  Transfers                 General transfer comment: unable to attempt  Ambulation/Gait                 Stairs            Wheelchair Mobility    Modified Rankin (Stroke Patients Only) Modified Rankin (Stroke Patients Only) Pre-Morbid Rankin Score: No significant disability Modified Rankin: Severe disability      Balance Overall balance assessment: Needs assistance   Sitting balance-Leahy Scale: Zero                                      Cognition Arousal/Alertness: Lethargic Behavior During Therapy: Restless Overall Cognitive Status: Difficult to assess Area of Impairment: Following commands                               General Comments: pt with eyes closed, not following command, intubated       Exercises General Exercises - Lower Extremity Long Arc Quad: PROM;10 reps;Seated;Both Hip ABduction/ADduction: PROM;10 reps;Seated;Both Hip Flexion/Marching: PROM;Seated;10 reps;Both    General Comments        Pertinent Vitals/Pain Pain Assessment: (CPOT= 0)    Home Living                      Prior Function            PT Goals (current goals can now be found in the care plan section) Progress towards PT goals: Not progressing toward goals - comment    Frequency    Min 2X/week      PT Plan Current plan remains appropriate;Frequency needs to be updated    Co-evaluation              AM-PAC PT "  6 Clicks" Daily Activity  Outcome Measure  Difficulty turning over in bed (including adjusting bedclothes, sheets and blankets)?: Unable Difficulty moving from lying on back to sitting on the side of the bed? : Unable Difficulty sitting down on and standing up from a chair with arms (e.g., wheelchair, bedside commode, etc,.)?: Unable Help needed moving to and from a bed to chair (including a wheelchair)?: Total Help needed walking in hospital room?: Total Help needed climbing 3-5 steps with a railing? : Total 6 Click Score: 6    End of Session   Activity Tolerance: Other (comment)(limited by cognition) Patient left: in bed;with call bell/phone within reach;with bed alarm set;with nursing/sitter in room;with restraints reapplied Nurse Communication: Mobility status;Need for lift equipment PT Visit Diagnosis: Hemiplegia and  hemiparesis;Other symptoms and signs involving the nervous system (R29.898);Other abnormalities of gait and mobility (R26.89) Hemiplegia - Right/Left: Right Hemiplegia - dominant/non-dominant: Dominant Hemiplegia - caused by: Cerebral infarction     Time: 5277-8242 PT Time Calculation (min) (ACUTE ONLY): 26 min  Charges:  $Therapeutic Activity: 23-37 mins                    G Codes:       Elwyn Reach, PT [phone]    Thomas Hanna 08/22/2017, 12:32 PM

## 2017-08-22 NOTE — Progress Notes (Signed)
146ml of fentanyl 57mcg/ml wasted with Tillie Rung RN in sink.

## 2017-08-22 NOTE — Progress Notes (Signed)
Nutrition Follow-up  INTERVENTION:   As able resume enteral nutrition therapy: Vital AF 1.2 @ 20 ml/hr and advancing rate by 10 ml every 4 hours to goal rate of 60 ml/hr   NUTRITION DIAGNOSIS:   Inadequate oral intake related to inability to eat as evidenced by NPO status. Ongoing.   GOAL:   Patient will meet greater than or equal to 90% of their needs Not met.   MONITOR:   Vent status, I & O's  ASSESSMENT:   Pt with PMH of CAD, prostate Ca, CVA admitted with new afib, ICH s/p tPA and thrombolysis.   Pt discussed during ICU rounds and with RN.  Per RN pt with multiple episodes of vomiting undigested food yesterday . TF d/c'ed. Per RN plan to observe over the weekend and re-evaluate neuro status Monday.   Patient is currently intubated on ventilator support MV: 10 L/min Temp (24hrs), Avg:99.1 F (37.3 C), Min:97.9 F (36.6 C), Max:100 F (37.8 C)  Medications reviewed and include: protonix Labs reviewed: K+ 3.3 (L), PO4: 1.7 (L) CBG's: [phone]   Diet Order:  Diet NPO time specified  EDUCATION NEEDS:   No education needs have been identified at this time  Skin:  Skin Assessment: Skin Integrity Issues: Skin Integrity Issues:: Stage I Stage I: buttocks  Last BM:  1/10 small type 5  Height:   Ht Readings from Last 1 Encounters:  08/19/17 6' 2"  (1.88 m)    Weight:   Wt Readings from Last 1 Encounters:  08/22/17 195 lb 15.8 oz (88.9 kg)    Ideal Body Weight:  86.3 kg  BMI:  Body mass index is 25.16 kg/m.  Estimated Nutritional Needs:   Kcal:  6503  Protein:  105-115 grams  Fluid:  > 1.7 L/day  Maylon Peppers RD, LDN, CNSC [phone] Pager [phone] After Hours Pager

## 2017-08-22 NOTE — Progress Notes (Signed)
NEUROHOSPITALISTS STROKE TEAM - DAILY PROGRESS NOTE   ADMISSION HISTORY: HPI: Thomas Hanna is a 84 y.o. male was a past medical history of coronary artery disease, orthostatic hypotension, prostate cancer, left thalamic stroke with mild residual right hemiparesis since January 2018, who presented to the emergency room as an acute code stroke for sudden onset of right-sided weakness, left-sided gaze preference and a aphasia. But he lives with family.  He was last known normal at 1900 hrs. on 08/18/2017 as witnessed by his son.  Upon checking on him later on that day, he was noted to be completely mute and nonverbal.  He was not able to follow commands.  He had a gaze preference to the left and could not move his right side of the body.  EMS was called.  They assessed him on side and brought him in as an acute code stroke.  Upon initial evaluation performed by me, he was an NIH stroke scale of greater than 20. His symptoms are consistent with a complete left MCA syndrome.  Noncontrast CT of the head was negative for any acute bleed.  He was given IV TPA.  CT angiogram of the head and neck and CT perfusion study were performed which were favorable for intervention for the left distal M1/proximal M2 occlusion.  Neuro endovascular consultation was obtained and the endovascular team was called and he was taken in for thrombectomy.  He is on no blood thinners.  He has had no recent surgeries.  He has no recent strokes.  Son was called over the phone and all the history was obtained from him as much as possible during the acute situation over the phone.  Son and another family member was then met at bedside prior to getting patient in for the endovascular procedure.  They were appraised of the situation and all questions were answered.  Of note, patient was hypertensive with systolic blood pressure in the 972Q and diastolics over 206 on arrival.  Mild delays  in administering IV TPA due to blood pressure being out of parameters and use of IV labetalol to control blood pressures.  LKW: 1900 hrs. on 08/18/2017 tpa given?:  Yes Premorbid modified Rankin scale (mRS): 1 NIHSS 1a Level of Conscious.: 0 1b LOC Questions: 2 1c LOC Commands: 2 2 Best Gaze: 1 3 Visual: 2 4 Facial Palsy: 2 5a Motor Arm - left: 0 5b Motor Arm - Right: 4 6a Motor Leg - Left: 2 6b Motor Leg - Right: 3 7 Limb Ataxia: 0 8 Sensory: 2 9 Best Language: 3 10 Dysarthria: 2 11 Extinct. and Inatten.: 2 TOTAL: 27  SUBJECTIVE (INTERVAL HISTORY)  His son is  at the bedside. Patient is found laying in bed in NAD, intubated and sedated. He vomited again last pm   and has been obtunded but can be aroused and follows few commands. and remains in atrial fibrillation with rapid heart rate and is on Cardizem drip. He has not been able to wean off ventilator     OBJECTIVE Lab Results: CBC:  Recent Labs  Lab 08/20/17 0522 08/21/17 0445 08/22/17 0535 08/22/17 1434  WBC 12.7* 14.0* 11.1*  --   HGB 9.9* 10.4*  7.9* 7.7*  HCT 31.4* 32.9* 24.7* 24.0*  MCV 91.3 94.5 94.3  --   PLT 194 193 132*  --    BMP: Recent Labs  Lab 08/18/17 2151 08/18/17 2202 08/19/17 0304 08/20/17 0522 08/20/17 1656 08/21/17 0445 08/21/17 1712 08/22/17 0535  NA 135 139 136 137  --  139  --  140  K 3.8 3.9 3.6 3.5  --  3.7  --  3.3*  CL 103 102 106 109  --  111  --  118*  CO2 26  --  21* 20*  --  21*  --  16*  GLUCOSE 147* 142* 135* 110*  --  122*  --  133*  BUN 28* 27* 24* 18  --  17  --  20  CREATININE 0.76 0.80 0.71 0.70  --  0.58*  --  0.62  CALCIUM 8.9  --  8.1* 7.9*  --  7.8*  --  6.4*  MG  --   --   --  1.8 1.9 1.9 2.4 1.9  PHOS  --   --   --  2.7 3.2 2.4* 2.0* 1.7*   Liver Function Tests:  Recent Labs  Lab 08/18/17 2151  AST 23  ALT 22  ALKPHOS 65  BILITOT 0.8  PROT 6.7  ALBUMIN 4.0   Coagulation Studies:  No results for input(s): APTT, INR in the last 72 hours. PHYSICAL  EXAM Temp:  [98.7 F (37.1 C)-100 F (37.8 C)] 98.9 F (37.2 C) (01/11 1600) Pulse Rate:  [60-121] 87 (01/11 1800) Resp:  [16-32] 18 (01/11 1800) BP: (95-172)/(50-94) 138/79 (01/11 1800) SpO2:  [92 %-100 %] 99 % (01/11 1800) FiO2 (%):  [40 %] 40 % (01/11 1700) Weight:  [195 lb 15.8 oz (88.9 kg)] 195 lb 15.8 oz (88.9 kg) (01/11 0500) General - Well nourished, well developed, in no apparent distress, Intubated and sedated HEENT-  Normocephalic, Normal external eye/conjunctiva.  Normal external ears. Normal external nose, mucus membranes and septum.   Cardiovascular - Regular rate and rhythm  Respiratory - Lungs clear bilaterally. No wheezing. Abdomen - soft and non-tender, BS normal Extremities- no edema or cyanosis NEURO:  Mental Status: Intubated and sedated. Will open eyes brief when name called loudly. Aphasic. Does not follow any commands. Cranial Nerves: PERRL gaze preference to the left, slight hypertropia of both eyes unable to cross the midline,, visual fields.  Right homonymous hemianopsia as evidenced by no blink to threat from that site,, right lower facial weakness Motor: 0/5 right upper extremity, minimal withdrawal to noxious stimulus in the right lower extremity, 5/5 left upper extremity with no drift, no drift in the left lower extremity but unable to hold above the bed secondary to inability to follow commands. Tone: is normal and bulk is normal Sensation-grossly reduced sensation on the right hemibody to noxious edematous. Coordination: Unable to test Gait- deferred  IMAGING: I have personally reviewed the radiological images below and agree with the radiology interpretations.  Ct Head Wo Contrast Result Date: 08/19/2017 IMPRESSION: Loss of gray-white differentiation within the insula and frontal operculum compatible with evolving acute infarction. No additional area of infarction identified at this time. No intracranial hemorrhage or mass effect.  CTA Head/Neck and  Cerebral Perfusion W Contrast Result Date: 08/18/2017 IMPRESSION: CTA neck: 1. Patent carotid and vertebral arteries of the neck. 2. Dense calcified plaque of bilateral carotid bifurcations with proximal ICA moderate 60-70% stenosis. 3. Moderate stenosis of right vertebral artery origin. CTA head: 1. Distal left M1  occlusion extending into left M2 superior division. Early reconstitution of left M2 inferior division via collateralization. Poor downstream collateralization and left superior MCA distribution. 2. Left paraclinoid ICA severe stenosis. 3. Right paraclinoid and terminal ICA occlusion or near occlusion. Contrast opacification of the right upper cervical ICA diminishes gradually to the terminal segment, severe stenosis was present on the prior study. Findings may represent chronic severe stenosis with poor antegrade flow or interval thrombosis. CT brain perfusion: 1. Core infarct volume of 71 cc. 2. Mismatch ischemic penumbra of 102 cc. 3. Infarct location left superior MCA distribution.   Portable Chest Xray Result Date: 08/19/2017 IMPRESSION: Endotracheal tube 4.8 cm from carina. Aortic atherosclerosis. No acute pulmonary process identified.   Dg Abd Portable 1v Result Date: 08/19/2017 IMPRESSION: The esophagogastric tube tip and proximal port of appear to lie in the stomach.   Ct Head Code Stroke Wo Contrast Result Date: 08/18/2017 IMPRESSION: 1. No acute stroke, hemorrhage, or mass effect identified. 2. Density in left M2 superior division, possible thrombus. 3. ASPECTS is 10 4. Stable chronic microvascular ischemic changes and parenchymal volume loss of the brain.   Echocardiogram:                                               Study Conclusions - Left ventricle: Posterior basal hypokinesis. Wall thickness was   increased in a pattern of mild LVH. The estimated ejection   fraction was 55%. The study is not technically sufficient to   allow evaluation of LV diastolic function. - Mitral  valve: Moderately calcified annulus. Moderately thickened,   moderately calcified leaflets . There was mild regurgitation. - Left atrium: The atrium was mildly dilated. - Atrial septum: No defect or patent foramen ovale was identified.   MRI BRAIN:                                                PENDING    IMPRESSION: Mr. Guerry Covington is a 84 y.o. male with PMH of coronary artery disease, orthostatic hypotension, prostate cancer, left thalamic stroke with mild residual right hemiparesis since January 2018, who presented to the emergency room as an acute code stroke for sudden onset of right-sided weakness, left-sided gaze preference and a aphasia with an NIH stroke scale of 27. Noncontrast CT of the head did not show any bleed.  IV TPA was administered after reviewing contraindications with the son over the phone. Head and neck vessel imaging reveals left M1/M2 superior division occlusion.  Deemed to be a good candidate for endovascular treatment given less than 6 hours from presentation and a favorable perfusion profile on the CT perfusion as well. Neuro endovascular was consulted and the patient was taken for IAT.   Acute ischemic strokes in multiple vascular distributions status post IV TPA and mechanical thrombectomy Suspected Etiology: likely cardio embolic given new onset AFIB   Resultant Symptoms:  right-sided weakness, left-sided gaze preference and a aphasia. Stroke Risk Factors: hyperlipidemia and hypertension, AFIB Other Stroke Risk Factors: Advanced age, Hx stroke, CAD, Prostate Cancer, Hx of MI  PROCEDURES:  08/19/2017  Dr Estanislado Pandy S/P Lt common carotid arteriogram followed by revascularization of Lt MCA distal M1 and mid perisylvian M3 branch  with mechanical thrombolysis achieving  a TICI 2 b reperfusion.   08/22/2017 ASSESSMENT:   Remains intubated and sedated. Aphasic, not following commands. Moving LUE spontaneously and occasionally LLE. No spontaneous movement of Right  side. +withdrawal to pain. No family at bedside.  PLAN  08/22/2017: Start ASA . [facility] consultation for new onset AFIB CCM to attempt extubation when medically ready Maintain strict B/P parameters Frequent neuro checks Telemetry monitoring PT/OT/SLP Consult PM & Rehab Consult Case Management /MSW Ongoing aggressive stroke risk factor management Patient will be counseled to be compliant with his antithrombotic medications Patient will be counseled on Lifestyle modifications including, Diet, Exercise, and Stress Follow up with [facility] in 6 weeks  HX OF STROKES: Left thalamic stroke with mild residual right hemiparesis since January 2018  INTRACRANIAL Atherosclerosis &Stenosis: May consider DAPT if appropriate at discharge  DYSPHAGIA: NPO until passes SLP swallow evaluation Aspiration Precautions in progress  AFIB, NEW ONSET: EKG shows A. Fib, episodes of Bradycardia overnight Cardiology Consultation, Appreciate assistance Continue to HOLD [facility] - for now Will need long-term anticoagulation once safe from a stroke standpoint, if appropriate Repeat CT in 2 weeks for consideration of starting anticoagulation if CT is stable.   MEDICAL ISSUES: CCM following, appreciate assistance Inability to protect airway in the postoperative setting Remains intubated/sedated Wean Fentanyl and SBT's   Mild anemia, at baseline  Hgb 9.9 / Hct 31.2 - stable this morning Repeat CBC in AM  HYPERTENSION: Change blood pressure goal below 110 2180. Wean and taper norepinephrine drip. Discontinue arterial line Long term BP goal normotensive. Home Meds: NONE  HYPERLIPIDEMIA:    Component Value Date/Time   CHOL 140 08/19/2017 0304   TRIG 86 08/19/2017 0304   HDL 36 (L) 08/19/2017 0304   CHOLHDL 3.9 08/19/2017 0304   VLDL 17 08/19/2017 0304   LDLCALC 87 08/19/2017 0304  Home Meds:  Lovaza LDL  goal < 70 Will continued Lovaza and start Lipitor to 10 mg daily, if  passes SLP evaluation Continue statin at discharge, if appropriate  R/O DIABETES: Lab Results  Component Value Date   HGBA1C 5.7 (H) 08/19/2017  HgbA1c goal < 7.0  Other Active Problems: Principal Problem:   Acute ischemic left MCA stroke (HCC) Active Problems:   Coronary artery disease due to lipid rich plaque   Normocytic anemia   Pressure injury of skin   Atrial fibrillation with rapid ventricular response (HCC)   Bilateral carotid artery stenosis   Aspiration pneumonia (HCC)   Ileus (HCC)   Endotracheally intubated    Hospital day # 4 VTE prophylaxis: SCD's  Diet : Diet NPO time specified   FAMILY UPDATES: No family at bedside  TEAM UPDATES: Garvin Fila, MD STATUS:  FULL   Prior Home Stroke Medications:  clopidogrel 75 mg daily  Discharge Stroke Meds:  Please discharge patient on TBD   Disposition: 01-Home or Self Care Therapy Recs:               PENDING Home Equipment:         PENDING Follow Up:  Follow-up Information    Garvin Fila, MD. Schedule an appointment as soon as possible for a visit in 6 week(s).   Specialties:  Neurology, Radiology Contact information: [address] [place] [address] [place] Not In -PCP Follow up in 1-2 weeks   Case Management aware of need     I have personally examined this patient,  reviewed notes, independently viewed imaging studies, participated in medical decision making and plan of care.ROS completed by me personally and pertinent positives fully documented  I have made any additions or clarifications directly to the above note.  . Patient has presented with a large left MCA infarcts due to new onset atrial fibrillation and underwent treatment with IV TPA and mechanical thrombectomy with partial recanalization. MRI shows multiple embolic strokes in different vascular distributions Prognosis is guarded. Plan  Family not available at bedside today for discussion. Discussed  with Dr. Carson Myrtle from pulmonary critical care medicine and patient`s son at the bedside and answered questions. Continue medical support for now for a few days and decision on extubation only after  discussion with family and goals of care about reintubation versus tracheostomy and CODE STATUS discussion.   This patient is critically ill and at significant risk of neurological worsening, death and care requires constant monitoring of vital signs, hemodynamics,respiratory and cardiac monitoring, extensive review of multiple databases, frequent neurological assessment, discussion with family, other specialists and medical decision making of high complexity.I have made any additions or clarifications directly to the above note.This critical care time does not reflect procedure time, or teaching time or supervisory time of PA/NP/Med Resident etc but could involve care discussion time.  I spent 30 minutes of neurocritical care time  in the care of  this patient.      Antony Contras, MD Medical Director [facility] Pager: [phone] 08/22/2017 6:17 PM  To contact Stroke Continuity provider, please refer to [email or web address] After hours, contact General Neurology

## 2017-08-22 NOTE — Progress Notes (Signed)
Progress Note  Patient Name: Thomas Hanna Date of Encounter: 08/22/2017  Primary Cardiologist: Shelva Majestic, MD   Subjective   Unable to obtain.  Intubated and sedated.   Inpatient Medications    Scheduled Meds: . aspirin  300 mg Rectal Daily  . chlorhexidine gluconate (MEDLINE KIT)  15 mL Mouth Rinse BID  . mouth rinse  15 mL Mouth Rinse 10 times per day  . pantoprazole (PROTONIX) IV  40 mg Intravenous Daily   Continuous Infusions: . sodium chloride 75 mL/hr at 08/22/17 1055  . ampicillin-sulbactam (UNASYN) IV 3 g (08/22/17 1154)  . dexmedetomidine (PRECEDEX) IV infusion Stopped (08/21/17 1312)  . diltiazem (CARDIZEM) infusion 5 mg/hr (08/22/17 0700)  . fentaNYL infusion INTRAVENOUS Stopped (08/21/17 1312)  . vancomycin Stopped (08/22/17 0048)   PRN Meds: acetaminophen **OR** acetaminophen (TYLENOL) oral liquid 160 mg/5 mL **OR** acetaminophen, bisacodyl, fentaNYL (SUBLIMAZE) injection, fentaNYL (SUBLIMAZE) injection, ondansetron, senna-docusate   Vital Signs    Vitals:   08/22/17 1000 08/22/17 1100 08/22/17 1113 08/22/17 1156  BP: (!) 143/72 (!) 149/84    Pulse: 90 91    Resp: (!) 22 (!) 23    Temp:    99.4 F (37.4 C)  TempSrc:    Axillary  SpO2: 99% 100% 98%   Weight:      Height:        Intake/Output Summary (Last 24 hours) at 08/22/2017 1203 Last data filed at 08/22/2017 1000 Gross per 24 hour  Intake 2763.33 ml  Output 865 ml  Net 1898.33 ml   Filed Weights   08/19/17 0300 08/21/17 0346 08/22/17 0500  Weight: 177 lb 4 oz (80.4 kg) 185 lb 6.5 oz (84.1 kg) 195 lb 15.8 oz (88.9 kg)    Telemetry    Atrial fibrillation.  80s-100s.  PVCs.  - Personally Reviewed  ECG    N/a - Personally Reviewed  Physical Exam   VS:  BP (!) 149/84   Pulse 91   Temp 99.4 F (37.4 C) (Axillary)   Resp (!) 23   Ht 6' 2"  (1.88 m)   Wt 195 lb 15.8 oz (88.9 kg)   SpO2 98%   BMI 25.16 kg/m  , BMI Body mass index is 25.16 kg/m. GENERAL:  Critically  ill-appearing.  Intubated. HEENT: Pupils equal round and reactive, fundi not visualized, oral mucosa unremarkable NECK:  No jugular venous distention, waveform within normal limits, carotid upstroke brisk and symmetric, no bruits LUNGS:  Vented breath sounds.  HEART:  Irregularly irregular.  PMI not displaced or sustained,S1 and S2 within normal limits, no S3, no S4, no clicks, no rubs, no murmurs ABD:  Mildly distended.  Hypoactive BS. no bruits, no rebound, no guarding, no midline pulsatile mass, no hepatomegaly, no splenomegaly EXT:  2 plus pulses throughout, trace edema and ankel, no cyanosis no clubbing SKIN:  No rashes no nodules NEURO:  R hemiparesis.   PSYCH:  Unable to obtain. Intubated and sedated.   Labs    Chemistry Recent Labs  Lab 08/18/17 2151  08/20/17 0522 08/21/17 0445 08/22/17 0535  NA 135   < > 137 139 140  K 3.8   < > 3.5 3.7 3.3*  CL 103   < > 109 111 118*  CO2 26   < > 20* 21* 16*  GLUCOSE 147*   < > 110* 122* 133*  BUN 28*   < > 18 17 20   CREATININE 0.76   < > 0.70 0.58* 0.62  CALCIUM 8.9   < >  7.9* 7.8* 6.4*  PROT 6.7  --   --   --   --   ALBUMIN 4.0  --   --   --   --   AST 23  --   --   --   --   ALT 22  --   --   --   --   ALKPHOS 65  --   --   --   --   BILITOT 0.8  --   --   --   --   GFRNONAA >60   < > >60 >60 >60  GFRAA >60   < > >60 >60 >60  ANIONGAP 6   < > 8 7 6    < > = values in this interval not displayed.     Hematology Recent Labs  Lab 08/20/17 0522 08/21/17 0445 08/22/17 0535  WBC 12.7* 14.0* 11.1*  RBC 3.44* 3.48* 2.62*  HGB 9.9* 10.4* 7.9*  HCT 31.4* 32.9* 24.7*  MCV 91.3 94.5 94.3  MCH 28.8 29.9 30.2  MCHC 31.5 31.6 32.0  RDW 14.0 14.4 14.4  PLT 194 193 132*    Cardiac EnzymesNo results for input(s): TROPONINI in the last 168 hours.  Recent Labs  Lab 08/18/17 2202  TROPIPOC 0.01     BNPNo results for input(s): BNP, PROBNP in the last 168 hours.   DDimer No results for input(s): DDIMER in the last 168 hours.     Radiology    Dg Chest Port 1 View  Result Date: 08/22/2017 CLINICAL DATA:  Respiratory difficulty EXAM: PORTABLE CHEST 1 VIEW COMPARISON:  08/21/2017 FINDINGS: Endotracheal and NG tubes are stable. The heart is moderately enlarged. Central basilar hazy airspace disease is stable. No pneumothorax. No pleural effusion. IMPRESSION: Stable hazy bilateral airspace disease. Electronically Signed   By: Marybelle Killings M.D.   On: 08/22/2017 09:12   Dg Chest Port 1 View  Result Date: 08/21/2017 CLINICAL DATA:  Endotracheal tube EXAM: PORTABLE CHEST 1 VIEW COMPARISON:  08/19/2017 FINDINGS: Endotracheal tube 2 cm above the carina.  NG tube enters the stomach Progression of bibasilar atelectasis/infiltrate. Negative for heart failure or edema. IMPRESSION: Endotracheal tube remains in satisfactory position. Progression of bibasilar atelectasis/infiltrate. Electronically Signed   By: Franchot Gallo M.D.   On: 08/21/2017 07:56   Dg Abd Portable 1v  Result Date: 08/21/2017 CLINICAL DATA:  Vomiting EXAM: PORTABLE ABDOMEN - 1 VIEW COMPARISON:  08/19/2017 FINDINGS: NG tube in the stomach. Stool in the right colon. Mildly distended distal transverse colon. Gas in the rectosigmoid. Negative for bowel obstruction. IMPRESSION: NG tube in the stomach.  Mild colonic ileus without obstruction. Electronically Signed   By: Franchot Gallo M.D.   On: 08/21/2017 08:04    Cardiac Studies   Echo 08/19/17: Study Conclusions  - Left ventricle: Posterior basal hypokinesis. Wall thickness was   increased in a pattern of mild LVH. The estimated ejection   fraction was 55%. The study is not technically sufficient to   allow evaluation of LV diastolic function. - Mitral valve: Moderately calcified annulus. Moderately thickened,   moderately calcified leaflets . There was mild regurgitation. - Left atrium: The atrium was mildly dilated. - Atrial septum: No defect or patent foramen ovale was identified.  Patient Profile      Mr. Bunte is a 13M with CAD s/p CABG and PCI, prior stroke, orthostatic hypotension and prostate cancer here with large L MCA stroke.  Assessment & Plan    # L MCA stroke: Embolic  stroke in the setting of newly diagnosed atrial fibrillation.  R hemiparesis.  Patient has been made DNR.  He remains on the ventilator and family has elected to try and extubate him when ready.    # Paroxysmal atrial fibrillation: Patient was well-rate controlled until 3 am on 08/21/17.  Nurse reports that he had more stimulation at the time and had emesis. He was started on a diltiazem drip.  BP and heart rate are now better-controlled.  No changes for now.  Anticoagulation when stable per Neurology.   # CAD s/p CABG and PCI: Not an active issue.  Continue aspirin.      We will continue to monitor from [place] over the weekend.  Please call if there are questions.   For questions or updates, please contact [place] Please consult [email or web address] for contact info under Cardiology/STEMI.      Signed, Skeet Latch, MD  08/22/2017, 12:03 PM

## 2017-08-23 DIAGNOSIS — J690 Pneumonitis due to inhalation of food and vomit: Secondary | ICD-10-CM

## 2017-08-23 DIAGNOSIS — Z978 Presence of other specified devices: Secondary | ICD-10-CM

## 2017-08-23 DIAGNOSIS — J9601 Acute respiratory failure with hypoxia: Secondary | ICD-10-CM

## 2017-08-23 DIAGNOSIS — I10 Essential (primary) hypertension: Secondary | ICD-10-CM

## 2017-08-23 DIAGNOSIS — I6523 Occlusion and stenosis of bilateral carotid arteries: Secondary | ICD-10-CM

## 2017-08-23 LAB — CBC
HEMATOCRIT: 32.4 % — AB (ref 39.0–52.0)
Hemoglobin: 10.5 g/dL — ABNORMAL LOW (ref 13.0–17.0)
MCH: 30.1 pg (ref 26.0–34.0)
MCHC: 32.4 g/dL (ref 30.0–36.0)
MCV: 92.8 fL (ref 78.0–100.0)
PLATELETS: 216 10*3/uL (ref 150–400)
RBC: 3.49 MIL/uL — ABNORMAL LOW (ref 4.22–5.81)
RDW: 14.1 % (ref 11.5–15.5)
WBC: 16.7 10*3/uL — AB (ref 4.0–10.5)

## 2017-08-23 LAB — BASIC METABOLIC PANEL
ANION GAP: 10 (ref 5–15)
BUN: 23 mg/dL — AB (ref 6–20)
CALCIUM: 8.2 mg/dL — AB (ref 8.9–10.3)
CO2: 20 mmol/L — AB (ref 22–32)
CREATININE: 0.69 mg/dL (ref 0.61–1.24)
Chloride: 111 mmol/L (ref 101–111)
GFR calc Af Amer: >60 mL/min (ref >60)
GLUCOSE: 145 mg/dL — AB (ref 65–99)
Potassium: 3.9 mmol/L (ref 3.5–5.1)
Sodium: 141 mmol/L (ref 135–145)

## 2017-08-23 LAB — OCCULT BLOOD X 1 CARD TO LAB, STOOL: Fecal Occult Bld: NEGATIVE

## 2017-08-23 LAB — GLUCOSE, CAPILLARY
GLUCOSE-CAPILLARY: 144 mg/dL — AB (ref 65–99)
GLUCOSE-CAPILLARY: 120 mg/dL — AB (ref 65–99)
GLUCOSE-CAPILLARY: 128 mg/dL — AB (ref 65–99)
Glucose-Capillary: 135 mg/dL — ABNORMAL HIGH (ref 65–99)
Glucose-Capillary: 121 mg/dL — ABNORMAL HIGH (ref 65–99)

## 2017-08-23 LAB — MAGNESIUM: Magnesium: 2.3 mg/dL (ref 1.7–2.4)

## 2017-08-23 MED ORDER — DORZOLAMIDE HCL-TIMOLOL MAL 2-0.5 % OP SOLN
1.0000 [drp] | Freq: Every day | OPHTHALMIC | Status: DC
  Administered 2017-08-23 – 2017-08-24 (×2): 1 [drp] via OPHTHALMIC
  Filled 2017-08-23: qty 10

## 2017-08-23 MED ORDER — DORZOLAMIDE HCL 2 % OP SOLN
1.0000 [drp] | Freq: Two times a day (BID) | OPHTHALMIC | Status: DC
  Administered 2017-08-24: 1 [drp] via OPHTHALMIC
  Filled 2017-08-23 (×2): qty 10

## 2017-08-23 NOTE — Progress Notes (Signed)
PULMONARY / CRITICAL CARE MEDICINE   Name: Thomas Hanna MRN: [id number] DOB: 06/23/1924    ADMISSION DATE:  08/18/2017 CONSULTATION DATE: August 19, 2017  REFERRING MD: Dr. Leonie Man  CHIEF COMPLAINT: Stroke   SUBJECTIVE:  No events overnight  HISTORY OF PRESENT ILLNESS:   This 84 year old male is seen in consultation at the request of Dr. Leonie Man previous left thalamic stroke with mild residual right hemiparesis(January 2018) presented to [facility] emergency department on 1/7 with complaints of right-sided weakness, left-sided gaze preference, and aphasia.  Last known well at 1900 on 1/7.  He presented to the emergency department as a code stroke and was given IV tPA after noncontrast CT was negative for acute bleed.  Further cerebral imaging demonstrated left distal M1/proximal M2 occlusion.  He was taken to interventional radiology and underwent mechanical thrombolysis.  Post procedurally he was sent to the ICU for recovery and remained on the ventilator.  PCCM asked to consult.  OBJECTIVE:  VITAL SIGNS: BP (!) 172/84   Pulse 95   Temp 98.1 F (36.7 C) (Axillary)   Resp (!) 28   Ht 6\' 2"  (1.88 m)   Wt 87.7 kg (193 lb 5.5 oz)   SpO2 99%   BMI 24.82 kg/m   VENTILATOR SETTINGS: Vent Mode: PSV;CPAP FiO2 (%):  [40 %] 40 % Set Rate:  [12 bmp] 12 bmp Vt Set:  [650 mL] 650 mL PEEP:  [5 cmH20] 5 cmH20 Pressure Support:  [10 cmH20] 10 cmH20 Plateau Pressure:  [22 cmH20] 22 cmH20  INTAKE / OUTPUT: I/O last 3 completed shifts: In: 3757.9 [I.V.:2857.9; IV Piggyback:900] Out: 1583 [Urine:1090; Emesis/NG output:200]  PHYSICAL EXAMINATION: General:  Intubated and sedated. Neuro:  Sedated and intubated, not moving on the right, restrained on the left HEENT:  [place]/AT, PERRL, no JVD Cardiovascular:  IRIR, no MRG Lungs:  Clear bilaterally Abdomen:  Soft, distended, NT and +BS Musculoskeletal:  No acute deformity or ROM limitation Skin:  Grossly intact  LABS:  BMET Recent Labs   Lab 08/21/17 0445 08/22/17 0535 08/23/17 0614  NA 139 140 141  K 3.7 3.3* 3.9  CL 111 118* 111  CO2 21* 16* 20*  BUN 17 20 23*  CREATININE 0.58* 0.62 0.69  GLUCOSE 122* 133* 145*    Electrolytes Recent Labs  Lab 08/21/17 0445 08/21/17 1712 08/22/17 0535 08/23/17 0614  CALCIUM 7.8*  --  6.4* 8.2*  MG 1.9 2.4 1.9 2.3  PHOS 2.4* 2.0* 1.7*  --     CBC Recent Labs  Lab 08/21/17 0445 08/22/17 0535 08/22/17 1434 08/23/17 0614  WBC 14.0* 11.1*  --  16.7*  HGB 10.4* 7.9* 7.7* 10.5*  HCT 32.9* 24.7* 24.0* 32.4*  PLT 193 132*  --  216    Coag's Recent Labs  Lab 08/18/17 2151  APTT 30  INR 1.04    Sepsis Markers No results for input(s): LATICACIDVEN, PROCALCITON, O2SATVEN in the last 168 hours.  ABG Recent Labs  Lab 08/19/17 0345  PHART 7.437  PCO2ART 33.7  PO2ART 406*    Liver Enzymes Recent Labs  Lab 08/18/17 2151  AST 23  ALT 22  ALKPHOS 65  BILITOT 0.8  ALBUMIN 4.0    Cardiac Enzymes No results for input(s): TROPONINI, PROBNP in the last 168 hours.  Glucose Recent Labs  Lab 08/22/17 1608 08/22/17 2014 08/22/17 2355 08/23/17 0405 08/23/17 0752 08/23/17 1221  GLUCAP 143* 114* 133* 144* 135* 121*    Imaging No results found.  STUDIES:  CT head code  stroke 1/7> no acute stroke, hemorrhage, or mass-effect identified.  Density in the left M2 superior division. CT Angie of head 1/7> distal left M1 occlusion extending into the left M2 superior division.  Bilateral paraclinoid ICA stenosis with near occlusion on the right.  CULTURES: Results for orders placed or performed during the hospital encounter of 08/18/17  MRSA PCR Screening     Status: None   Collection Time: 08/19/17  2:33 AM  Result Value Ref Range Status   MRSA by PCR NEGATIVE NEGATIVE Final    Comment:        The GeneXpert MRSA Assay (FDA approved for NASAL specimens only), is one component of a comprehensive MRSA colonization surveillance program. It is not intended  to diagnose MRSA infection nor to guide or monitor treatment for MRSA infections.     ANTIBIOTICS: Perioperative cefazolin   SIGNIFICANT EVENTS: 1/7 presented with acute stroke, IV TPA given 1/8 taken to IR for mechanical thrombolysis.  Recovered in ICU on vent  LINES/TUBES: Endotracheal tube 1/8>  DISCUSSION: 84 year old male presented as code stroke found to have Left MCA CVA. Taken to IR for revascularization. Post-op remained on vent. PCCM asked to see.   ASSESSMENT / PLAN:  PULMONARY A: ASPIRATION PNEUMONIA ENDOTRACHEAL INTUBATION for airway protection. Inability to protect airway in the postoperative setting P: Start Unasyn/vancomycin. Begin PS trials Ventilator associated pneumonia prevention bundle CXR in am. ABG in am Titrate O2 for sat of 88-92%  CARDIOVASCULAR A: Hypertension Atrial fibrillation with rapid ventricular response Coronary artery disease Internal carotid artery stenosis P: ICU hemodynamic monitoring On Cardizem gtt. Watch for bradycardia with initiation of Precedex gtt for sedation BP goals per neurology (SBP of 120-140) Holding home midodrine. D/C heparin, will defer to neurology  RENAL A: No acute issues P: Monitor renal function considering contrast load.  BMET in AM Replace electrolytes as indicated NS at 75 ml/hr  GASTROINTESTINAL A: NONOBSTRUCTIVE ILEUS  P: GI prophylaxis with Protonix Continue Reglan.  HEMATOLOGIC A:  Mild anemia, Hgb 10.4-->7.9 P:  Gastroccult/Hemoccult Follow H/H D/C heparin Antiplatelet therapy per stroke team.    INFECTIOUS A: ASPIRATION PNEUMONIA P:  Unasyn/vancomycin F/U on cultures  ENDOCRINE/METABOLIC A:  hypokalemia P:   Follow glucose on BMP  NEUROLOGIC A:  Acute CVA Left MCA occlusion.  s/p IR revascularization 1/8 P:  SCDs. Stop DVT chemoprophylaxis (heparin) Case discussed with Dr. Leonie Man.  FAMILY  - Updates: Family updated bedside, DNR status confirmed, will have neurology lead  the charge for prognosis  - Inter-disciplinary family meet or Palliative Care meeting due by:  1/14  The patient is critically ill with multiple organ systems failure and requires high complexity decision making for assessment and support, frequent evaluation and titration of therapies, application of advanced monitoring technologies and extensive interpretation of multiple databases.   Critical Care Time devoted to patient care services described in this note is  35  Minutes. This time reflects time of care of this signee Dr Jennet Maduro. This critical care time does not reflect procedure time, or teaching time or supervisory time of PA/NP/Med student/Med Resident etc but could involve care discussion time.  Rush Farmer, M.D. [facility] Pulmonary/Critical Care Medicine. Pager: [phone]. After hours pager: [phone].

## 2017-08-23 NOTE — Progress Notes (Signed)
NEUROHOSPITALISTS STROKE TEAM - DAILY PROGRESS NOTE   SUBJECTIVE (INTERVAL HISTORY) His son is  at the bedside. Patient still intubated, but able to briefly open eyes on voice, still has right upper extremity hyperphagia and right lower extremity hemiparesis.  Still in A. fib, heart rate controlled.  Still on Cardizem drip.     OBJECTIVE Lab Results: CBC:  Recent Labs  Lab 08/21/17 0445 08/22/17 0535 08/22/17 1434 08/23/17 0614  WBC 14.0* 11.1*  --  16.7*  HGB 10.4* 7.9* 7.7* 10.5*  HCT 32.9* 24.7* 24.0* 32.4*  MCV 94.5 94.3  --  92.8  PLT 193 132*  --  216   BMP: Recent Labs  Lab 08/19/17 0304  08/20/17 0522 08/20/17 1656 08/21/17 0445 08/21/17 1712 08/22/17 0535 08/23/17 0614  NA 136  --  137  --  139  --  140 141  K 3.6  --  3.5  --  3.7  --  3.3* 3.9  CL 106  --  109  --  111  --  118* 111  CO2 21*  --  20*  --  21*  --  16* 20*  GLUCOSE 135*  --  110*  --  122*  --  133* 145*  BUN 24*  --  18  --  17  --  20 23*  CREATININE 0.71  --  0.70  --  0.58*  --  0.62 0.69  CALCIUM 8.1*  --  7.9*  --  7.8*  --  6.4* 8.2*  MG  --    < > 1.8 1.9 1.9 2.4 1.9 2.3  PHOS  --   --  2.7 3.2 2.4* 2.0* 1.7*  --    < > = values in this interval not displayed.   Liver Function Tests:  Recent Labs  Lab 08/18/17 2151  AST 23  ALT 22  ALKPHOS 65  BILITOT 0.8  PROT 6.7  ALBUMIN 4.0   Coagulation Studies:  No results for input(s): APTT, INR in the last 72 hours. PHYSICAL EXAM Temp:  [98.1 F (36.7 C)-99.4 F (37.4 C)] 98.1 F (36.7 C) (01/12 0412) Pulse Rate:  [60-121] 93 (01/12 0600) Resp:  [15-32] 21 (01/12 0600) BP: (118-172)/(61-103) 164/79 (01/12 0600) SpO2:  [92 %-100 %] 100 % (01/12 0600) FiO2 (%):  [40 %] 40 % (01/12 0403) Weight:  [193 lb 5.5 oz (87.7 kg)] 193 lb 5.5 oz (87.7 kg) (01/12 0425) General - Well nourished, well developed, in no apparent distress, Intubated and sedated HEENT-  Normocephalic,  Normal external eye/conjunctiva.  Normal external ears. Normal external nose, mucus membranes and septum.   Cardiovascular - Regular rate and rhythm  Respiratory - Lungs clear bilaterally. No wheezing. Abdomen - soft and non-tender, BS normal Extremities- no edema or cyanosis NEURO:  Mental Status: Intubated and sedated. Will open eyes brief when name called loudly. Aphasic. Does not follow any commands. Cranial Nerves: PERRL gaze preference to the left, slight hypertropia of both eyes unable to cross the midline,, visual fields.  Right homonymous hemianopsia as evidenced by no blink to threat from that site,, right lower facial weakness Motor: 0/5 right upper extremity, minimal withdrawal to noxious stimulus in  the right lower extremity, 5/5 left upper extremity with no drift, no drift in the left lower extremity but unable to hold above the bed secondary to inability to follow commands. Tone: is normal and bulk is normal Sensation-grossly reduced sensation on the right hemibody to noxious edematous. Coordination: Unable to test Gait- deferred  IMAGING: I have personally reviewed the radiological images below and agree with the radiology interpretations.  Ct Head Wo Contrast Result Date: 08/19/2017 IMPRESSION: Loss of gray-white differentiation within the insula and frontal operculum compatible with evolving acute infarction. No additional area of infarction identified at this time. No intracranial hemorrhage or mass effect.  CTA Head/Neck and Cerebral Perfusion W Contrast Result Date: 08/18/2017 IMPRESSION: CTA neck: 1. Patent carotid and vertebral arteries of the neck. 2. Dense calcified plaque of bilateral carotid bifurcations with proximal ICA moderate 60-70% stenosis. 3. Moderate stenosis of right vertebral artery origin. CTA head: 1. Distal left M1 occlusion extending into left M2 superior division. Early reconstitution of left M2 inferior division via collateralization. Poor downstream  collateralization and left superior MCA distribution. 2. Left paraclinoid ICA severe stenosis. 3. Right paraclinoid and terminal ICA occlusion or near occlusion. Contrast opacification of the right upper cervical ICA diminishes gradually to the terminal segment, severe stenosis was present on the prior study. Findings may represent chronic severe stenosis with poor antegrade flow or interval thrombosis. CT brain perfusion: 1. Core infarct volume of 71 cc. 2. Mismatch ischemic penumbra of 102 cc. 3. Infarct location left superior MCA distribution.   Portable Chest Xray Result Date: 08/19/2017 IMPRESSION: Endotracheal tube 4.8 cm from carina. Aortic atherosclerosis. No acute pulmonary process identified.   Dg Abd Portable 1v Result Date: 08/19/2017 IMPRESSION: The esophagogastric tube tip and proximal port of appear to lie in the stomach.   Ct Head Code Stroke Wo Contrast Result Date: 08/18/2017 IMPRESSION: 1. No acute stroke, hemorrhage, or mass effect identified. 2. Density in left M2 superior division, possible thrombus. 3. ASPECTS is 10 4. Stable chronic microvascular ischemic changes and parenchymal volume loss of the brain.   Echocardiogram:                                               Study Conclusions - Left ventricle: Posterior basal hypokinesis. Wall thickness was   increased in a pattern of mild LVH. The estimated ejection   fraction was 55%. The study is not technically sufficient to   allow evaluation of LV diastolic function. - Mitral valve: Moderately calcified annulus. Moderately thickened,   moderately calcified leaflets . There was mild regurgitation. - Left atrium: The atrium was mildly dilated. - Atrial septum: No defect or patent foramen ovale was identified.   MRI BRAIN:                                                PENDING    IMPRESSION: [name] is a 84 y.o. male with PMH of coronary artery disease, orthostatic hypotension, prostate cancer, left thalamic  stroke with mild residual right hemiparesis since January 2018, who presented to the emergency room as an acute code stroke for sudden onset of right-sided weakness, left-sided gaze preference and a aphasia with an NIH stroke scale of 27. Noncontrast CT  of the head did not show any bleed.  IV TPA was administered after reviewing contraindications with the son over the phone. Head and neck vessel imaging reveals left M1/M2 superior division occlusion.  Deemed to be a good candidate for endovascular treatment given less than 6 hours from presentation and a favorable perfusion profile on the CT perfusion as well. Neuro endovascular was consulted and the patient was taken for IAT.   Acute ischemic strokes in multiple vascular distributions status post IV TPA and mechanical thrombectomy Suspected Etiology: likely cardio embolic given new onset AFIB   Resultant Symptoms:  right-sided weakness, left-sided gaze preference and a aphasia. Stroke Risk Factors: hyperlipidemia and hypertension, AFIB Other Stroke Risk Factors: Advanced age, Hx stroke, CAD, Prostate Cancer, Hx of MI  PROCEDURES:  08/19/2017  Dr Estanislado Pandy S/P Lt common carotid arteriogram followed by revascularization of Lt MCA distal M1 and mid perisylvian M3 branch  with mechanical thrombolysis achieving a TICI 2 b reperfusion.   08/23/2017 ASSESSMENT:   Remains intubated and sedated. Aphasic, not following commands. Moving LUE spontaneously and occasionally LLE. No spontaneous movement of Right side. +withdrawal to pain. No family at bedside.  PLAN  08/23/2017: Start ASA . [facility] consultation for new onset AFIB CCM to attempt extubation when medically ready Maintain strict B/P parameters Frequent neuro checks Telemetry monitoring PT/OT/SLP Consult PM & Rehab Consult Case Management /MSW Ongoing aggressive stroke risk factor management Patient will be counseled to be compliant with his antithrombotic medications Patient will  be counseled on Lifestyle modifications including, Diet, Exercise, and Stress Follow up with [facility] in 6 weeks  HX OF STROKES: Left thalamic stroke with mild residual right hemiparesis since January 2018  INTRACRANIAL Atherosclerosis &Stenosis: May consider DAPT if appropriate at discharge  DYSPHAGIA: NPO until passes SLP swallow evaluation Aspiration Precautions in progress  AFIB, NEW ONSET: EKG shows A. Fib, episodes of Bradycardia overnight Cardiology Consultation, Appreciate assistance Continue to HOLD [facility] - for now Will need long-term anticoagulation once safe from a stroke standpoint, if appropriate Repeat CT in 2 weeks for consideration of starting anticoagulation if CT is stable.   MEDICAL ISSUES: CCM following, appreciate assistance Inability to protect airway in the postoperative setting Remains intubated/sedated Wean Fentanyl and SBT's   Mild anemia, at baseline  Hgb 9.9 / Hct 31.2 - stable this morning Repeat CBC in AM  HYPERTENSION: Change blood pressure goal below 110 2180. Wean and taper norepinephrine drip. Discontinue arterial line Long term BP goal normotensive. Home Meds: NONE  HYPERLIPIDEMIA:    Component Value Date/Time   CHOL 140 08/19/2017 0304   TRIG 86 08/19/2017 0304   HDL 36 (L) 08/19/2017 0304   CHOLHDL 3.9 08/19/2017 0304   VLDL 17 08/19/2017 0304   LDLCALC 87 08/19/2017 0304  Home Meds:  Lovaza LDL  goal < 70 Will continued Lovaza and start Lipitor to 10 mg daily, if passes SLP evaluation Continue statin at discharge, if appropriate  R/O DIABETES: Lab Results  Component Value Date   HGBA1C 5.7 (H) 08/19/2017  HgbA1c goal < 7.0  Other Active Problems: Principal Problem:   Acute ischemic left MCA stroke (HCC) Active Problems:   Coronary artery disease due to lipid rich plaque   Normocytic anemia   Coronary artery disease involving coronary bypass graft of native heart without angina pectoris   Pressure injury  of skin   Atrial fibrillation with rapid ventricular response ([place])   Bilateral carotid artery stenosis   Aspiration pneumonia ([place])  Ileus ([place])   Endotracheally intubated   Hypokalemia   Hypertension    Hospital day # 5 VTE prophylaxis: SCD's  Diet : Diet NPO time specified   FAMILY UPDATES: No family at bedside  TEAM UPDATES: Rosalin Hawking, MD STATUS:  FULL   Prior Home Stroke Medications:  clopidogrel 75 mg daily  Discharge Stroke Meds:  Please discharge patient on TBD   Disposition: 01-Home or Self Care Therapy Recs:               PENDING Home Equipment:         PENDING Follow Up:  Follow-up Information    Garvin Fila, MD. Schedule an appointment as soon as possible for a visit in 6 week(s).   Specialties:  Neurology, Radiology Contact information: [address] [place] [id number] [place] Not In -PCP Follow up in 1-2 weeks   Case Management aware of need     ATTENDING NOTE:  I have personally examined this patient, reviewed notes, independently viewed imaging studies, participated in medical decision making and plan of care.ROS completed by me personally and pertinent positives fully documented  I have made any additions or clarifications directly to the above note.   84 year old male with history of CAD, prostate cancer, left thalamic stroke with right-sided weakness in 08/2016 admitted for right-sided weakness, left gaze and aphasia.  Status post TPA.  CT head and neck showed left M1 occlusion, bilateral ICA proximal 60-70% stenosis.  Right terminal ICA occlusion or near occlusion.  Had mechanical thrombectomy with TICI2b reperfusion.  Found to have A. fib with RVR, put on Cardizem IV.  MRI showed left cerebellum, left MCA, ACA and right MCA infarcts.  Stroke consistent with embolic stroke due to newly diagnosed A. fib.  EF 55%, LDL 87 and A1c 5.7.    Patient still intubated, on aspirin, HR controlled.  CCM on board, we  will off ventilation as able.  Still on antibiotics.  Rosalin Hawking, MD PhD Stroke Neurology 08/23/2017 10:28 PM  This patient is critically ill and at significant risk of neurological worsening, death and care requires constant monitoring of vital signs, hemodynamics,respiratory and cardiac monitoring, extensive review of multiple databases, frequent neurological assessment, discussion with family, other specialists and medical decision making of high complexity. Discussed with Dr. Carson Myrtle. I spent 35 minutes of neurocritical care time  in the care of  this patient.       To contact Stroke Continuity provider, please refer to [email or web address] After hours, contact General Neurology

## 2017-08-24 ENCOUNTER — Inpatient Hospital Stay (HOSPITAL_COMMUNITY): Payer: Medicare Other

## 2017-08-24 DIAGNOSIS — I2510 Atherosclerotic heart disease of native coronary artery without angina pectoris: Secondary | ICD-10-CM

## 2017-08-24 DIAGNOSIS — Z7189 Other specified counseling: Secondary | ICD-10-CM

## 2017-08-24 DIAGNOSIS — Z515 Encounter for palliative care: Secondary | ICD-10-CM

## 2017-08-24 DIAGNOSIS — I25810 Atherosclerosis of coronary artery bypass graft(s) without angina pectoris: Secondary | ICD-10-CM

## 2017-08-24 DIAGNOSIS — I2583 Coronary atherosclerosis due to lipid rich plaque: Secondary | ICD-10-CM

## 2017-08-24 LAB — BLOOD GAS, ARTERIAL
Acid-base deficit: 1.2 mmol/L (ref 0.0–2.0)
Bicarbonate: 22.1 mmol/L (ref 20.0–28.0)
DRAWN BY: 51191
FIO2: 40
MECHVT: 650 mL
O2 SAT: 95.8 %
PATIENT TEMPERATURE: 98.6
PEEP/CPAP: 5 cmH2O
PO2 ART: 78.8 mmHg — AB (ref 83.0–108.0)
RATE: 12 resp/min
pCO2 arterial: 31.1 mmHg — ABNORMAL LOW (ref 32.0–48.0)
pH, Arterial: 7.465 — ABNORMAL HIGH (ref 7.350–7.450)

## 2017-08-24 LAB — VANCOMYCIN, TROUGH: VANCOMYCIN TR: 11 ug/mL — AB (ref 15–20)

## 2017-08-24 LAB — CBC
HEMATOCRIT: 33.1 % — AB (ref 39.0–52.0)
HEMOGLOBIN: 10.9 g/dL — AB (ref 13.0–17.0)
MCH: 30.2 pg (ref 26.0–34.0)
MCHC: 32.9 g/dL (ref 30.0–36.0)
MCV: 91.7 fL (ref 78.0–100.0)
Platelets: 236 10*3/uL (ref 150–400)
RBC: 3.61 MIL/uL — AB (ref 4.22–5.81)
RDW: 13.9 % (ref 11.5–15.5)
WBC: 16.8 10*3/uL — AB (ref 4.0–10.5)

## 2017-08-24 LAB — GLUCOSE, CAPILLARY
GLUCOSE-CAPILLARY: 114 mg/dL — AB (ref 65–99)
GLUCOSE-CAPILLARY: 123 mg/dL — AB (ref 65–99)
GLUCOSE-CAPILLARY: 127 mg/dL — AB (ref 65–99)
Glucose-Capillary: 119 mg/dL — ABNORMAL HIGH (ref 65–99)
Glucose-Capillary: 143 mg/dL — ABNORMAL HIGH (ref 65–99)
Glucose-Capillary: 121 mg/dL — ABNORMAL HIGH (ref 65–99)
Glucose-Capillary: 125 mg/dL — ABNORMAL HIGH (ref 65–99)

## 2017-08-24 LAB — BASIC METABOLIC PANEL
ANION GAP: 11 (ref 5–15)
BUN: 20 mg/dL (ref 6–20)
CHLORIDE: 112 mmol/L — AB (ref 101–111)
CO2: 20 mmol/L — AB (ref 22–32)
Calcium: 8.2 mg/dL — ABNORMAL LOW (ref 8.9–10.3)
Creatinine, Ser: 0.56 mg/dL — ABNORMAL LOW (ref 0.61–1.24)
GFR calc non Af Amer: >60 mL/min (ref >60)
GLUCOSE: 134 mg/dL — AB (ref 65–99)
POTASSIUM: 3.1 mmol/L — AB (ref 3.5–5.1)
Sodium: 143 mmol/L (ref 135–145)

## 2017-08-24 LAB — MAGNESIUM: Magnesium: 2.1 mg/dL (ref 1.7–2.4)

## 2017-08-24 LAB — PHOSPHORUS: PHOSPHORUS: 1.9 mg/dL — AB (ref 2.5–4.6)

## 2017-08-24 MED ORDER — MIDAZOLAM HCL 2 MG/2ML IJ SOLN
2.0000 mg | INTRAMUSCULAR | Status: DC | PRN
  Administered 2017-08-24: 2 mg via INTRAVENOUS
  Filled 2017-08-24: qty 2

## 2017-08-24 MED ORDER — ASPIRIN EC 325 MG PO TBEC: 325.0000 mg | DELAYED_RELEASE_TABLET | Freq: Every day | ORAL | Status: DC

## 2017-08-24 MED ORDER — POTASSIUM CHLORIDE 10 MEQ/100ML IV SOLN
10.0000 meq | INTRAVENOUS | Status: AC
  Administered 2017-08-24 (×4): 10 meq via INTRAVENOUS
  Filled 2017-08-24 (×4): qty 100

## 2017-08-24 MED ORDER — VANCOMYCIN HCL 10 G IV SOLR
1500.0000 mg | Freq: Two times a day (BID) | INTRAVENOUS | Status: DC
  Administered 2017-08-25 (×2): 1500 mg via INTRAVENOUS
  Filled 2017-08-24 (×3): qty 1500

## 2017-08-24 MED ORDER — GLYCOPYRROLATE 0.2 MG/ML IJ SOLN
0.4000 mg | INTRAMUSCULAR | Status: DC
  Administered 2017-08-24 – 2017-08-30 (×29): 0.4 mg via INTRAVENOUS
  Filled 2017-08-24 (×30): qty 2

## 2017-08-24 MED ORDER — PRO-STAT SUGAR FREE PO LIQD: 30.0000 mL | Freq: Two times a day (BID) | ORAL | Status: DC

## 2017-08-24 MED ORDER — PANTOPRAZOLE SODIUM 40 MG PO TBEC: 40.0000 mg | DELAYED_RELEASE_TABLET | Freq: Every day | ORAL | Status: DC

## 2017-08-24 MED ORDER — VANCOMYCIN HCL 10 G IV SOLR
1500.0000 mg | Freq: Once | INTRAVENOUS | Status: AC
  Administered 2017-08-24: 1500 mg via INTRAVENOUS
  Filled 2017-08-24: qty 1500

## 2017-08-24 MED ORDER — MORPHINE SULFATE (PF) 4 MG/ML IV SOLN: 2.0000 mg | INTRAVENOUS | Status: DC | PRN

## 2017-08-24 MED ORDER — VITAL HIGH PROTEIN PO LIQD: 1000.0000 mL | ORAL | Status: DC

## 2017-08-24 MED ORDER — METOPROLOL TARTRATE 5 MG/5ML IV SOLN
2.5000 mg | INTRAVENOUS | Status: DC | PRN
  Administered 2017-08-24 – 2017-08-25 (×3): 5 mg via INTRAVENOUS
  Filled 2017-08-24 (×3): qty 5

## 2017-08-24 NOTE — Progress Notes (Signed)
[place] Progress Note Patient Name: Thomas Hanna DOB: 08-14-1923 MRN: [id number]   Date of Service  08/24/2017  HPI/Events of Note  K+ = 3.1 and Creatinine = 0.56.   eICU Interventions  Will replace K+.     Intervention Category Major Interventions: Electrolyte abnormality - evaluation and management  Farrie Sann [place] 08/24/2017, 5:54 AM

## 2017-08-24 NOTE — Progress Notes (Addendum)
   08/24/17 1600  Clinical Encounter Type  Visited With Patient and family together  Visit Type Initial  Referral From Nurse  Consult/Referral To Chaplain  Spiritual Encounters  Spiritual Needs Ritual  Stress Factors  Patient Stress Factors Exhausted  Family Stress Factors Exhausted    This was a page from a charge nurse for a 84 yr old male caucasian who suffered stroke. Family on-site;son and wife. Family requested for last rights from catholic priest I made a number of calls and eventually got hold of Father Creed Copper who responded and accepted to come in 30 minutes. I also provided emotional support through reflective listening and compassionate poresence.  Alazia Crocket a Medical sales representative, [employer]

## 2017-08-24 NOTE — Progress Notes (Signed)
Pt placed back on full vent support due to increased RR 

## 2017-08-24 NOTE — Progress Notes (Signed)
Pharmacy Antibiotic Note  Thomas Hanna is a 84 y.o. male admitted on 08/18/2017 with a stroke.  vt 11  Plan: Continue Unasyn 3gm IV Q8H increase Vanc 1500 mg IV Q12H F/u renal fxn, C&S, clinical status and trough at SS  Height: 6\' 2"  (188 cm) Weight: 195 lb 12.3 oz (88.8 kg) IBW/kg (Calculated) : 82.2  Temp (24hrs), Avg:98.6 F (37 C), Min:98.2 F (36.8 C), Max:99 F (37.2 C)  Recent Labs  Lab 08/20/17 0522 08/21/17 0445 08/22/17 0535 08/23/17 0614 08/24/17 0344 08/24/17 1058  WBC 12.7* 14.0* 11.1* 16.7* 16.8*  --   CREATININE 0.70 0.58* 0.62 0.69 0.56*  --   VANCOTROUGH  --   --   --   --   --  11*    Estimated Creatinine Clearance: 69.9 mL/min (A) (by C-G formula based on SCr of 0.56 mg/dL (L)).    Allergies  Allergen Reactions  . Sulfa Antibiotics Other (See Comments)    Unknown   Levester Fresh, PharmD, BCPS, BCCCP Clinical Pharmacist Clinical phone for 08/24/2017 from 7a-3:30p: [phone] If after 3:30p, please call main pharmacy at: [phone] 08/24/2017 12:27 PM

## 2017-08-24 NOTE — Consult Note (Signed)
Consultation Note Date: 08/24/2017   Patient Name: Thomas Hanna  DOB: 05-Oct-1923  MRN: [id number]  Age / Sex: 84 y.o., male  PCP: System, Pcp Not In Referring Physician: Rosalin Hawking, MD  Reason for Consultation: Establishing goals of care, Psychosocial/spiritual support, Terminal Care and Withdrawal of life-sustaining treatment  HPI/Patient Profile: 84 y.o. male  with past medical history of left thalamic stroke (January 2018), atrial fib, hypertension, hyperlipidemia admitted on 08/18/2017 with right-sided weakness, left-sided gaze preference, aphasia.  Last known well at 1900 on 08/18/2017.  He presented to the emergency department as a code stroke and was given IV TPA on 08/18/2016.  Additionally, he underwent thrombo-lysis on 08/19/2017.  He has been intubated and is in ICU  Consult ordered for goals of care and potentially one-way extubation.   Clinical Assessment and Goals of Care: Met with patient, reviewed chart.  Patient's son Thomas Hanna, daughter-in-law Thomas Hanna, are at the bedside.  Additionally staffed with Dr. Nelda Hanna as well as Dr.Jeong. Per Thomas Hanna, he and his wife and father recently moved to [place] from [place].  [name] lives with his son and daughter-in-law.  They describe him as a very independent person despite his stroke in 2018.  Thomas Hanna is struggling with wanting to give his father every chance but also at the same time recognizing how acutely ill he is.  Son Thomas Hanna is his healthcare proxy  Thomas Hanna shares with me that today his father has been the most alert since his stroke on the seventh .  He is following simple commands to squeeze his hand, lift his hand and was noted to be smiling at his son and daughter-in-law.  Thomas Hanna is struggling with the fact that his father is becoming more alert; he states "these decisions were almost easier when he was completely unresponsive".  He also  goes on to tell me that "I want to give him every opportunity to get better", but does share that his father would not want to be living in a nursing home in a vegetative state.    SUMMARY OF RECOMMENDATIONS   DNR DNI confirmed Confirmed that patient would not want to pursue a trach or PEG Patient is Catholic and son has asked for Thomas Hanna, and give last rights which has been arranged for the tonight Plan is for palliative medicine to meet with family on 08/25/2017 for one-way extubation at 11 AM Patient is not full comfort care in terms of one-way extubation.  The plan at this point is to see how he does after extubation and reassess for initiating a drip if he experiences discomfort Code Status/Advance Care Planning:  DNR    Symptom Management:   Dyspnea: As needed order for morphine in place  Agitation: As needed order for Versed in place  Secretions: Started scheduled Robinul  Palliative Prophylaxis:   Aspiration, Bowel Regimen, Delirium Protocol, Eye Care, Frequent Pain Assessment, Oral Care and Turn Reposition  Additional Recommendations (Limitations, Scope, Preferences):  No Chemotherapy, No Radiation, No Surgical Procedures and No Tracheostomy  Psycho-social/Spiritual:  Desire for further Chaplaincy support:yes  Additional Recommendations: Grief/Bereavement Support  Prognosis:   Unable to determine  Discharge Planning: To Be Determined      Primary Diagnoses: Present on Admission: . Acute ischemic left MCA stroke ([name]) . Bilateral carotid artery stenosis . Coronary artery disease due to lipid rich plaque . Normocytic anemia . Coronary artery disease involving coronary bypass graft of native heart without angina pectoris . Hypertension   I have reviewed the medical record, interviewed the patient and family, and examined the patient. The following aspects are pertinent.  Past Medical History:  Diagnosis Date  . Coronary artery disease   . MI  (myocardial infarction) ([place])   . Orthostatic hypotension 08/2016  . Prostate cancer ([place])   . Stroke [facility])    Social History   Socioeconomic History  . Marital status: Widowed    Spouse name: None  . Number of children: None  . Years of education: None  . Highest education level: None  Social Needs  . Financial resource strain: None  . Food insecurity - worry: None  . Food insecurity - inability: None  . Transportation needs - medical: None  . Transportation needs - non-medical: None  Occupational History  . None  Tobacco Use  . Smoking status: Former Smoker    Last attempt to quit: 1970    Years since quitting: 49.0  . Smokeless tobacco: Never Used  Substance and Sexual Activity  . Alcohol use: No  . Drug use: No  . Sexual activity: None  Other Topics Concern  . None  Social History Narrative  . None   Family History  Problem Relation Age of Onset  . Diabetes Mother   . Uterine cancer Sister    Scheduled Meds: . [START ON 08/25/2017] aspirin EC  325 mg Oral Daily  . chlorhexidine gluconate (MEDLINE KIT)  15 mL Mouth Rinse BID  . dorzolamide  1 drop Both Eyes BID  . dorzolamide-timolol  1 drop Left Eye QHS  . feeding supplement (PRO-STAT SUGAR FREE 64)  30 mL Per Tube BID  . feeding supplement (VITAL HIGH PROTEIN)  1,000 mL Per Tube Q24H  . glycopyrrolate  0.4 mg Intravenous Q4H  . mouth rinse  15 mL Mouth Rinse 10 times per day  . pantoprazole  40 mg Oral Daily   Continuous Infusions: . sodium chloride 75 mL/hr at 08/24/17 0700  . ampicillin-sulbactam (UNASYN) IV Stopped (08/24/17 1300)  . diltiazem (CARDIZEM) infusion Stopped (08/23/17 1600)  . fentaNYL infusion INTRAVENOUS Stopped (08/21/17 1312)  . [START ON 08/25/2017] vancomycin     PRN Meds:.acetaminophen **OR** acetaminophen (TYLENOL) oral liquid 160 mg/5 mL **OR** acetaminophen, bisacodyl, fentaNYL (SUBLIMAZE) injection, fentaNYL (SUBLIMAZE) injection, metoprolol tartrate, midazolam, morphine  injection, ondansetron, senna-docusate Medications Prior to Admission:  Prior to Admission medications   Medication Sig Start Date End Date Taking? Authorizing Provider  Ascorbic Acid (VITAMIN C) 100 MG tablet Take 100 mg by mouth daily.   Yes [provider]  cholecalciferol (VITAMIN D) 1000 units tablet Take 1,000 Units by mouth daily.   Yes [provider]  clopidogrel (PLAVIX) 75 MG tablet Take 75 mg by mouth daily.   Yes [provider]  dorzolamide (TRUSOPT) 2 % ophthalmic solution Place 1 drop into both eyes 2 (two) times daily.   Yes [provider]  ferrous sulfate 325 (65 FE) MG tablet Take 325 mg by mouth every morning.    Yes [provider]  folic acid (FOLVITE) 675 MCG tablet  Take 400 mcg by mouth daily.   Yes [provider]  gabapentin (NEURONTIN) 100 MG capsule Take 300-600 mg by mouth See admin instructions. Pt takes 600 mg in the morning and noon and 300 mg at bedtime   Yes [provider]  Ginkgo Biloba (GINKOBA PO) Take 1 capsule by mouth daily.   Yes [provider]  latanoprost (XALATAN) 0.005 % ophthalmic solution Place 1 drop into both eyes at bedtime.    Yes [provider]  midodrine (PROAMATINE) 5 MG tablet Take 0.5 tablets (2.5 mg total) by mouth 2 (two) times daily with a meal. 05/15/17  Yes Troy Sine, MD  omega-3 acid ethyl esters (LOVAZA) 1 g capsule Take 1 g by mouth 2 (two) times daily.   Yes [provider]  tamsulosin (FLOMAX) 0.4 MG CAPS capsule Take 0.4 mg by mouth daily after supper.    Yes [provider]  vitamin B-12 (CYANOCOBALAMIN) 100 MCG tablet Take 100 mcg by mouth daily.   Yes [provider]   Allergies  Allergen Reactions  . Sulfa Antibiotics Other (See Comments)    Unknown   Review of Systems  Unable to perform ROS: Intubated    Physical Exam  Constitutional: He appears well-developed.  Critically ill-appearing elderly man.   He is in ICU, intubated  Cardiovascular:  Atrial fib, tachycardic  Pulmonary/Chest:  Patient is intubated Copious secretions heard  Abdominal: Soft.  Neurological:  Patient has his eyes open and is squeezing his son's hand and following commands but fatigues very easily  Skin: Skin is warm and dry. There is pallor.  Psychiatric:  Unable to test  Nursing note and vitals reviewed.   Vital Signs: BP (!) 186/109   Pulse (!) 113   Temp 99 F (37.2 C) (Axillary)   Resp (!) 32   Ht 6' 2"  (1.88 m)   Wt 88.8 kg (195 lb 12.3 oz)   SpO2 99%   BMI 25.14 kg/m  Pain Assessment: CPOT       SpO2: SpO2: 99 % O2 Device:SpO2: 99 % O2 Flow Rate: .O2 Flow Rate (L/min): (S) 30 L/min(per ABG results)  IO: Intake/output summary:   Intake/Output Summary (Last 24 hours) at 08/24/2017 1729 Last data filed at 08/24/2017 1300 Gross per 24 hour  Intake 2400 ml  Output 825 ml  Net 1575 ml    LBM: Last BM Date: 08/23/17 Baseline Weight: Weight: 82.9 kg (182 lb 12.2 oz) Most recent weight: Weight: 88.8 kg (195 lb 12.3 oz)     Palliative Assessment/Data:   Flowsheet Rows     Most Recent Value  Intake Tab  Referral Department  Critical care  Unit at Time of Referral  ICU  Palliative Care Primary Diagnosis  Neurology  Date Notified  08/24/17  Palliative Care Type  New Palliative care  Reason for referral  Clarify Goals of Care  Date of Admission  08/18/17  Date first seen by Palliative Care  08/24/17  # of days Palliative referral response time  0 Day(s)  # of days IP prior to Palliative referral  6  Clinical Assessment  Palliative Performance Scale Score  30%  Pain Max last 24 hours  Not able to report  Pain Min Last 24 hours  Not able to report  Dyspnea Max Last 24 Hours  Not able to report  Dyspnea Min Last 24 hours  Not able to report  Nausea Max Last 24 Hours  Not able to report  Nausea Min  Last 24 Hours  Not able to report  Anxiety Max Last 24 Hours  Not able to report    Anxiety Min Last 24 Hours  Not able to report  Other Max Last 24 Hours  Not able to report  Psychosocial & Spiritual Assessment  Palliative Care Outcomes  Patient/Family meeting held?  Yes  Who was at the meeting?  son and DIL  Patient/Family wishes: Interventions discontinued/not started   Trach, PEG  Palliative Care follow-up planned  Yes, Facility      Time In: 1600 Time Out: 1730 Time Total: 90 min Greater than 50%  of this time was spent counseling and coordinating care related to the above assessment and plan. Staffed with Dr. Nelda Hanna, and Dr. Carson Myrtle  Signed by: Dory Horn, NP   Please contact Palliative Medicine Team phone at [phone] for questions and concerns.  For individual provider: See Shea Evans

## 2017-08-24 NOTE — Progress Notes (Signed)
NEUROHOSPITALISTS STROKE TEAM - DAILY PROGRESS NOTE   SUBJECTIVE (INTERVAL HISTORY) His son is  at the bedside. Patient still intubated, but able to briefly open eyes on voice, still has right upper extremity hyperphagia and right lower extremity hemiparesis.  Still in A. fib, heart rate controlled.  Now off Cardizem drip.  Son is very optimistic about pt progress and recovery. He thinks that pt is able to follow his commands. However, on my exam, pt possibly followed commands for eye opening and closing, but no other commands. Pt on weaning trial, but may not able to protect airway. I had long discussion with son at bedside, updated pt current condition, treatment plan and potential prognosis.       OBJECTIVE Lab Results: CBC:  Recent Labs  Lab 08/22/17 0535 08/22/17 1434 08/23/17 0614 08/24/17 0344  WBC 11.1*  --  16.7* 16.8*  HGB 7.9* 7.7* 10.5* 10.9*  HCT 24.7* 24.0* 32.4* 33.1*  MCV 94.3  --  92.8 91.7  PLT 132*  --  216 236   BMP: Recent Labs  Lab 08/20/17 0522 08/20/17 1656 08/21/17 0445 08/21/17 1712 08/22/17 0535 08/23/17 0614 08/24/17 0344  NA 137  --  139  --  140 141 143  K 3.5  --  3.7  --  3.3* 3.9 3.1*  CL 109  --  111  --  118* 111 112*  CO2 20*  --  21*  --  16* 20* 20*  GLUCOSE 110*  --  122*  --  133* 145* 134*  BUN 18  --  17  --  20 23* 20  CREATININE 0.70  --  0.58*  --  0.62 0.69 0.56*  CALCIUM 7.9*  --  7.8*  --  6.4* 8.2* 8.2*  MG 1.8 1.9 1.9 2.4 1.9 2.3 2.1  PHOS 2.7 3.2 2.4* 2.0* 1.7*  --  1.9*   Liver Function Tests:  Recent Labs  Lab 08/18/17 2151  AST 23  ALT 22  ALKPHOS 65  BILITOT 0.8  PROT 6.7  ALBUMIN 4.0   Coagulation Studies:  No results for input(s): APTT, INR in the last 72 hours. PHYSICAL EXAM Temp:  [98.2 F (36.8 C)-99 F (37.2 C)] 99 F (37.2 C) (01/13 1125) Pulse Rate:  [80-114] 113 (01/13 1300) Resp:  [14-34] 32 (01/13 1300) BP: (128-186)/(67-118)  186/109 (01/13 1300) SpO2:  [96 %-100 %] 99 % (01/13 1300) FiO2 (%):  [40 %] 40 % (01/13 1159) Weight:  [195 lb 12.3 oz (88.8 kg)] 195 lb 12.3 oz (88.8 kg) (01/13 0413) General - Well nourished, well developed, in no apparent distress, Intubated and sedated HEENT-  Normocephalic, Normal external eye/conjunctiva.  Normal external ears. Normal external nose, mucus membranes and septum.   Cardiovascular - Regular rate and rhythm  Respiratory - Lungs clear bilaterally. No wheezing. Abdomen - soft and non-tender, BS normal Extremities- no edema or cyanosis NEURO:  Mental Status: Intubated and sedated. Will open eyes brief when name called loudly. Likely aphasia. Does not follow any commands except open and close eyes. Cranial Nerves: PERRL gaze preference to the left, slight hypertropia of both eyes unable to cross the midline.  Right homonymous hemianopsia as  evidenced by no blink to threat from that site, right lower facial weakness Motor: 0/5 right upper extremity, minimal withdrawal to noxious stimulus in the right lower extremity, 3/5 left upper extremity, 3+ LLE. Tone: is decreased and bulk is normal DTR 1+ and no babinski Sensation, coordination and gait not tested.  IMAGING: I have personally reviewed the radiological images below and agree with the radiology interpretations.  Ct Head Wo Contrast Result Date: 08/19/2017 IMPRESSION: Loss of gray-white differentiation within the insula and frontal operculum compatible with evolving acute infarction. No additional area of infarction identified at this time. No intracranial hemorrhage or mass effect.  CTA Head/Neck and Cerebral Perfusion W Contrast Result Date: 08/18/2017 IMPRESSION: CTA neck: 1. Patent carotid and vertebral arteries of the neck. 2. Dense calcified plaque of bilateral carotid bifurcations with proximal ICA moderate 60-70% stenosis. 3. Moderate stenosis of right vertebral artery origin. CTA head: 1. Distal left M1 occlusion  extending into left M2 superior division. Early reconstitution of left M2 inferior division via collateralization. Poor downstream collateralization and left superior MCA distribution. 2. Left paraclinoid ICA severe stenosis. 3. Right paraclinoid and terminal ICA occlusion or near occlusion. Contrast opacification of the right upper cervical ICA diminishes gradually to the terminal segment, severe stenosis was present on the prior study. Findings may represent chronic severe stenosis with poor antegrade flow or interval thrombosis. CT brain perfusion: 1. Core infarct volume of 71 cc. 2. Mismatch ischemic penumbra of 102 cc. 3. Infarct location left superior MCA distribution.   Portable Chest Xray Result Date: 08/19/2017 IMPRESSION: Endotracheal tube 4.8 cm from carina. Aortic atherosclerosis. No acute pulmonary process identified.   Dg Abd Portable 1v Result Date: 08/19/2017 IMPRESSION: The esophagogastric tube tip and proximal port of appear to lie in the stomach.   Ct Head Code Stroke Wo Contrast Result Date: 08/18/2017 IMPRESSION: 1. No acute stroke, hemorrhage, or mass effect identified. 2. Density in left M2 superior division, possible thrombus. 3. ASPECTS is 10 4. Stable chronic microvascular ischemic changes and parenchymal volume loss of the brain.   Echocardiogram:                                               Study Conclusions - Left ventricle: Posterior basal hypokinesis. Wall thickness was   increased in a pattern of mild LVH. The estimated ejection   fraction was 55%. The study is not technically sufficient to   allow evaluation of LV diastolic function. - Mitral valve: Moderately calcified annulus. Moderately thickened,   moderately calcified leaflets . There was mild regurgitation. - Left atrium: The atrium was mildly dilated. - Atrial septum: No defect or patent foramen ovale was identified.  MRI BRAIN:  1. Patchy multifocal acute ischemic infarcts involving the  bilateral cerebral hemispheres, with additional 1 cm left cerebellar infarct as above. Associated petechial hemorrhage at the left frontal operculum without hemorrhagic transformation. 2. Underlying mild chronic microvascular ischemic disease.      IMPRESSION: Mr. Jaiquan Temme is a 84 y.o. male with PMH of coronary artery disease, orthostatic hypotension, prostate cancer, left thalamic stroke with mild residual right hemiparesis since January 2018, who presented to the emergency room as an acute code stroke for sudden onset of right-sided weakness, left-sided gaze preference and a aphasia with an NIH stroke scale of 27. Noncontrast CT of the head did not show any bleed.  IV  TPA was administered after reviewing contraindications with the son over the phone. Head and neck vessel imaging reveals left M1/M2 superior division occlusion.  Deemed to be a good candidate for endovascular treatment given less than 6 hours from presentation and a favorable perfusion profile on the CT perfusion as well. Neuro endovascular was consulted and the patient was taken for IAT.   Acute ischemic strokes in multiple vascular distributions status post IV TPA and mechanical thrombectomy Suspected Etiology: likely cardio embolic given new onset AFIB   Resultant Symptoms:  right-sided weakness, left-sided gaze preference and a aphasia. Stroke Risk Factors: hyperlipidemia and hypertension, AFIB Other Stroke Risk Factors: Advanced age, Hx stroke, CAD, Prostate Cancer, Hx of MI  PROCEDURES:  08/19/2017  Dr Estanislado Pandy S/P Lt common carotid arteriogram followed by revascularization of Lt MCA distal M1 and mid perisylvian M3 branch  with mechanical thrombolysis achieving a TICI 2 b reperfusion.   08/24/2017 ASSESSMENT:   Remains intubated and sedated. Aphasic, not following commands. Moving LUE spontaneously and occasionally LLE. No spontaneous movement of Right side. +withdrawal to pain. No family at bedside.  PLAN   08/24/2017: Start ASA . [facility] consultation for new onset AFIB CCM to attempt extubation when medically ready Maintain strict B/P parameters Frequent neuro checks Telemetry monitoring PT/OT/SLP Consult PM & Rehab Consult Case Management /MSW Ongoing aggressive stroke risk factor management Patient will be counseled to be compliant with his antithrombotic medications Patient will be counseled on Lifestyle modifications including, Diet, Exercise, and Stress Follow up with [facility] in 6 weeks  HX OF STROKES: Left thalamic stroke with mild residual right hemiparesis since January 2018  INTRACRANIAL Atherosclerosis &Stenosis: May consider DAPT if appropriate at discharge  DYSPHAGIA: NPO until passes SLP swallow evaluation Aspiration Precautions in progress  AFIB, NEW ONSET: EKG shows A. Fib, episodes of Bradycardia overnight Cardiology Consultation, Appreciate assistance Continue to HOLD [facility] - for now Will need long-term anticoagulation once safe from a stroke standpoint, if appropriate Repeat CT in 2 weeks for consideration of starting anticoagulation if CT is stable.   MEDICAL ISSUES: CCM following, appreciate assistance Inability to protect airway in the postoperative setting Remains intubated/sedated Wean Fentanyl and SBT's   Mild anemia, at baseline  Hgb 9.9 / Hct 31.2 - stable this morning Repeat CBC in AM  HYPERTENSION: Change blood pressure goal below 110 2180. Wean and taper norepinephrine drip. Discontinue arterial line Long term BP goal normotensive. Home Meds: NONE  HYPERLIPIDEMIA:    Component Value Date/Time   CHOL 140 08/19/2017 0304   TRIG 86 08/19/2017 0304   HDL 36 (L) 08/19/2017 0304   CHOLHDL 3.9 08/19/2017 0304   VLDL 17 08/19/2017 0304   LDLCALC 87 08/19/2017 0304  Home Meds:  Lovaza LDL  goal < 70 Will continued Lovaza and start Lipitor to 10 mg daily, if passes SLP evaluation Continue statin at discharge, if  appropriate  R/O DIABETES: Lab Results  Component Value Date   HGBA1C 5.7 (H) 08/19/2017  HgbA1c goal < 7.0  Other Active Problems: Principal Problem:   Acute ischemic left MCA stroke ([facility]) Active Problems:   Coronary artery disease due to lipid rich plaque   Normocytic anemia   Coronary artery disease involving coronary bypass graft of native heart without angina pectoris   Pressure injury of skin   Atrial fibrillation with rapid ventricular response ([place])   Bilateral carotid artery stenosis   Aspiration pneumonia ([place])   Ileus ([facility])   Endotracheally intubated   Hypokalemia  Hypertension    Hospital day # 6 VTE prophylaxis: SCD's  Diet : Diet NPO time specified   FAMILY UPDATES: No family at bedside  TEAM UPDATES: Rosalin Hawking, MD STATUS:  FULL   Prior Home Stroke Medications:  clopidogrel 75 mg daily  Discharge Stroke Meds:  Please discharge patient on TBD   Disposition: 01-Home or Self Care Therapy Recs:               PENDING Home Equipment:         PENDING Follow Up:  Follow-up Information    Garvin Fila, MD. Schedule an appointment as soon as possible for a visit in 6 week(s).   Specialties:  Neurology, Radiology Contact information: [address] [place] [id number] [place] Not In -PCP Follow up in 1-2 weeks   Case Management aware of need     ATTENDING NOTE:  I have personally examined this patient, reviewed notes, independently viewed imaging studies, participated in medical decision making and plan of care.ROS completed by me personally and pertinent positives fully documented  I have made any additions or clarifications directly to the above note.   84 year old male with history of CAD, prostate cancer, left thalamic stroke with right-sided weakness in 08/2016 admitted for right-sided weakness, left gaze and aphasia.  Status post TPA.  CT head and neck showed left M1 occlusion, bilateral ICA proximal  60-70% stenosis.  Right terminal ICA occlusion or near occlusion.  Had mechanical thrombectomy with TICI2b reperfusion.  Found to have A. fib with RVR, put on Cardizem IV.  MRI showed left cerebellum, left MCA, ACA and right MCA patchy moderate sized infarcts.  Stroke consistent with embolic stroke due to newly diagnosed A. fib.  EF 55%, LDL 87 and A1c 5.7.    Patient still intubated, off sedation, off cardizem, on aspirin, HR controlled.  CCM on board, on weaning trials.  Still on antibiotics for leukocytosis and aspiration. Put back on TF with small volume first.  Rosalin Hawking, MD PhD Stroke Neurology 08/24/2017 2:35 PM  This patient is critically ill and at significant risk of neurological worsening, death and care requires constant monitoring of vital signs, hemodynamics,respiratory and cardiac monitoring, extensive review of multiple databases, frequent neurological assessment, discussion with family, other specialists and medical decision making of high complexity. I had long discussion with son at bedside, updated pt current condition, treatment plan and potential prognosis. Discussed with Dr. Carson Myrtle. I spent 45 minutes of neurocritical care time  in the care of  this patient.       To contact Stroke Continuity provider, please refer to [email or web address] After hours, contact General Neurology

## 2017-08-24 NOTE — Progress Notes (Signed)
PULMONARY / CRITICAL CARE MEDICINE   Name: Thomas Hanna MRN: [id number] DOB: Jan 06, 1924    ADMISSION DATE:  08/18/2017 CONSULTATION DATE: August 19, 2017  REFERRING MD: Dr. Leonie Man  CHIEF COMPLAINT: Stroke   SUBJECTIVE:  No events overnight,weaning this AM  HISTORY OF PRESENT ILLNESS:   This 84 year old male is seen in consultation at the request of Dr. Leonie Man previous left thalamic stroke with mild residual right hemiparesis(January 2018) presented to [facility] emergency department on 1/7 with complaints of right-sided weakness, left-sided gaze preference, and aphasia.  Last known well at 1900 on 1/7.  He presented to the emergency department as a code stroke and was given IV tPA after noncontrast CT was negative for acute bleed.  Further cerebral imaging demonstrated left distal M1/proximal M2 occlusion.  He was taken to interventional radiology and underwent mechanical thrombolysis.  Post procedurally he was sent to the ICU for recovery and remained on the ventilator.  PCCM asked to consult.  OBJECTIVE:  VITAL SIGNS: BP (!) 186/109   Pulse (!) 113   Temp 99 F (37.2 C) (Axillary)   Resp (!) 32   Ht 6\' 2"  (1.88 m)   Wt 88.8 kg (195 lb 12.3 oz)   SpO2 99%   BMI 25.14 kg/m   VENTILATOR SETTINGS: Vent Mode: PSV;CPAP FiO2 (%):  [40 %] 40 % Set Rate:  [12 bmp-112 bmp] 112 bmp Vt Set:  [650 mL] 650 mL PEEP:  [5 cmH20] 5 cmH20 Pressure Support:  [5 cmH20-10 cmH20] 5 cmH20 Plateau Pressure:  [12 cmH20-22 cmH20] 22 cmH20  INTAKE / OUTPUT: I/O last 3 completed shifts: In: 3985 [I.V.:2885; IV Piggyback:1100] Out: 5284 [Urine:1275]  PHYSICAL EXAMINATION: General:  Intubated, following some commands Neuro:  Intubated, not moving on the right, restrained on the left HEENT:  [place]/AT, PERRL, EOM-I and MMM Cardiovascular:  IRIR, Nl S1/S2 and -M/R/G. Lungs:  CTA bilaterally Abdomen:  Soft, distended, NT and +BS Musculoskeletal:  No acute deformity or ROM limitation Skin:  Grossly  intact  LABS:  BMET Recent Labs  Lab 08/22/17 0535 08/23/17 0614 08/24/17 0344  NA 140 141 143  K 3.3* 3.9 3.1*  CL 118* 111 112*  CO2 16* 20* 20*  BUN 20 23* 20  CREATININE 0.62 0.69 0.56*  GLUCOSE 133* 145* 134*    Electrolytes Recent Labs  Lab 08/21/17 1712 08/22/17 0535 08/23/17 0614 08/24/17 0344  CALCIUM  --  6.4* 8.2* 8.2*  MG 2.4 1.9 2.3 2.1  PHOS 2.0* 1.7*  --  1.9*    CBC Recent Labs  Lab 08/22/17 0535 08/22/17 1434 08/23/17 0614 08/24/17 0344  WBC 11.1*  --  16.7* 16.8*  HGB 7.9* 7.7* 10.5* 10.9*  HCT 24.7* 24.0* 32.4* 33.1*  PLT 132*  --  216 236    Coag's Recent Labs  Lab 08/18/17 2151  APTT 30  INR 1.04    Sepsis Markers No results for input(s): LATICACIDVEN, PROCALCITON, O2SATVEN in the last 168 hours.  ABG Recent Labs  Lab 08/19/17 0345 08/24/17 0424  PHART 7.437 7.465*  PCO2ART 33.7 31.1*  PO2ART 406* 78.8*    Liver Enzymes Recent Labs  Lab 08/18/17 2151  AST 23  ALT 22  ALKPHOS 65  BILITOT 0.8  ALBUMIN 4.0    Cardiac Enzymes No results for input(s): TROPONINI, PROBNP in the last 168 hours.  Glucose Recent Labs  Lab 08/23/17 1607 08/23/17 2012 08/23/17 2345 08/24/17 0401 08/24/17 0736 08/24/17 1127  GLUCAP 120* 128* 143* 127* 119* 121*  Imaging Dg Chest Port 1 View  Result Date: 08/24/2017 CLINICAL DATA:  ET tube EXAM: PORTABLE CHEST 1 VIEW COMPARISON:  08/22/2017 FINDINGS: Cardiomegaly with vascular congestion and worsening bilateral airspace opacities, most confluent in the right perihilar and infrahilar region and left mid and lower lung. Findings could reflect edema or infection. Support devices are unchanged. IMPRESSION: Worsening bilateral airspace disease which could reflect edema or infection. Electronically Signed   By: Rolm Baptise M.D.   On: 08/24/2017 07:35    STUDIES:  CT head code stroke 1/7> no acute stroke, hemorrhage, or mass-effect identified.  Density in the left M2 superior  division. CT Angie of head 1/7> distal left M1 occlusion extending into the left M2 superior division.  Bilateral paraclinoid ICA stenosis with near occlusion on the right.  CULTURES: Results for orders placed or performed during the hospital encounter of 08/18/17  MRSA PCR Screening     Status: None   Collection Time: 08/19/17  2:33 AM  Result Value Ref Range Status   MRSA by PCR NEGATIVE NEGATIVE Final    Comment:        The GeneXpert MRSA Assay (FDA approved for NASAL specimens only), is one component of a comprehensive MRSA colonization surveillance program. It is not intended to diagnose MRSA infection nor to guide or monitor treatment for MRSA infections.     ANTIBIOTICS: Perioperative cefazolin   SIGNIFICANT EVENTS: 1/7 presented with acute stroke, IV TPA given 1/8 taken to IR for mechanical thrombolysis.  Recovered in ICU on vent  LINES/TUBES: Endotracheal tube 1/8>  DISCUSSION: 84 year old male presented as code stroke found to have Left MCA CVA. Taken to IR for revascularization. Post-op remained on vent. PCCM asked to see.   ASSESSMENT / PLAN:  PULMONARY A: ASPIRATION PNEUMONIA ENDOTRACHEAL INTUBATION for airway protection. Inability to protect airway in the postoperative setting P: Start Unasyn/vancomycin. Patient is weaning and ready for extubation, spoke with the son extensively and very frankly, informed that I am highly concerned about the patient's airway protection ability and I suspect that patient will not do well post extubation and it is important the son be prepared for respiratory failure.  He expressed understanding of that, asked for a priest to perform last rites and will call his brother and inform him. Will extubate only when family is ready, DNR. Titrate O2 for sat of 88-92% I fails post extubation the will proceed with comfort care.  CARDIOVASCULAR A: Hypertension Atrial fibrillation with rapid ventricular response Coronary artery  disease Internal carotid artery stenosis P: ICU hemodynamic monitoring On Cardizem gtt. Watch for bradycardia with initiation of Precedex gtt for sedation BP goals per neurology (SBP of 120-140) Holding home midodrine. D/C heparin, will defer to neurology  RENAL A: No acute issues P: Monitor renal function considering contrast load.  BMET in AM Replace electrolytes as indicated KVO IVF  GASTROINTESTINAL A: NONOBSTRUCTIVE ILEUS  P: GI prophylaxis with Protonix Continue Reglan. If survives extubation will need SLP  HEMATOLOGIC A:  Mild anemia, Hgb 10.4-->7.9 P:  Gastroccult/Hemoccult Follow H/H D/C heparin Antiplatelet therapy per stroke team.    INFECTIOUS A: ASPIRATION PNEUMONIA P:  Unasyn/vancomycin F/U on cultures  ENDOCRINE/METABOLIC A:  hypokalemia P:   Follow glucose on BMP  NEUROLOGIC A:  Acute CVA Left MCA occlusion.  s/p IR revascularization 1/8 P:  SCDs. Stop DVT chemoprophylaxis (heparin) Case discussed with Dr. Leonie Man.  FAMILY  - Updates: See above for discussion with the son  - Inter-disciplinary family meet or Palliative  Care meeting due by:  1/14  The patient is critically ill with multiple organ systems failure and requires high complexity decision making for assessment and support, frequent evaluation and titration of therapies, application of advanced monitoring technologies and extensive interpretation of multiple databases.   Critical Care Time devoted to patient care services described in this note is  35  Minutes. This time reflects time of care of this signee Dr Jennet Maduro. This critical care time does not reflect procedure time, or teaching time or supervisory time of PA/NP/Med student/Med Resident etc but could involve care discussion time.  Rush Farmer, M.D. [facility] Pulmonary/Critical Care Medicine. Pager: [phone]. After hours pager: [phone].

## 2017-08-24 NOTE — Progress Notes (Signed)
Pt potassium level 3.1. Dr. Oletta Darter contacted at [phone]. Potassium replacement initiated

## 2017-08-25 DIAGNOSIS — I480 Paroxysmal atrial fibrillation: Secondary | ICD-10-CM

## 2017-08-25 DIAGNOSIS — Z7189 Other specified counseling: Secondary | ICD-10-CM

## 2017-08-25 LAB — BASIC METABOLIC PANEL
ANION GAP: 10 (ref 5–15)
BUN: 21 mg/dL — AB (ref 6–20)
CHLORIDE: 113 mmol/L — AB (ref 101–111)
CO2: 22 mmol/L (ref 22–32)
Calcium: 8 mg/dL — ABNORMAL LOW (ref 8.9–10.3)
Creatinine, Ser: 0.6 mg/dL — ABNORMAL LOW (ref 0.61–1.24)
GLUCOSE: 123 mg/dL — AB (ref 65–99)
POTASSIUM: 3.4 mmol/L — AB (ref 3.5–5.1)
SODIUM: 145 mmol/L (ref 135–145)

## 2017-08-25 LAB — MAGNESIUM: MAGNESIUM: 2.2 mg/dL (ref 1.7–2.4)

## 2017-08-25 LAB — CBC
HEMATOCRIT: 34.1 % — AB (ref 39.0–52.0)
Hemoglobin: 10.8 g/dL — ABNORMAL LOW (ref 13.0–17.0)
MCH: 29.3 pg (ref 26.0–34.0)
MCHC: 31.7 g/dL (ref 30.0–36.0)
MCV: 92.7 fL (ref 78.0–100.0)
Platelets: 257 10*3/uL (ref 150–400)
RBC: 3.68 MIL/uL — ABNORMAL LOW (ref 4.22–5.81)
RDW: 14 % (ref 11.5–15.5)
WBC: 17.3 10*3/uL — ABNORMAL HIGH (ref 4.0–10.5)

## 2017-08-25 LAB — GLUCOSE, CAPILLARY
GLUCOSE-CAPILLARY: 112 mg/dL — AB (ref 65–99)
Glucose-Capillary: 110 mg/dL — ABNORMAL HIGH (ref 65–99)
Glucose-Capillary: 130 mg/dL — ABNORMAL HIGH (ref 65–99)
Glucose-Capillary: 142 mg/dL — ABNORMAL HIGH (ref 65–99)
Glucose-Capillary: 111 mg/dL — ABNORMAL HIGH (ref 65–99)

## 2017-08-25 LAB — PHOSPHORUS: Phosphorus: 2.3 mg/dL — ABNORMAL LOW (ref 2.5–4.6)

## 2017-08-25 MED ORDER — PANTOPRAZOLE SODIUM 40 MG PO PACK
40.0000 mg | PACK | Freq: Every day | ORAL | Status: DC
  Administered 2017-08-25 – 2017-08-28 (×2): 40 mg
  Filled 2017-08-25 (×2): qty 20

## 2017-08-25 MED ORDER — IPRATROPIUM-ALBUTEROL 0.5-2.5 (3) MG/3ML IN SOLN
3.0000 mL | Freq: Four times a day (QID) | RESPIRATORY_TRACT | Status: DC
  Administered 2017-08-25 – 2017-08-28 (×11): 3 mL via RESPIRATORY_TRACT
  Filled 2017-08-25 (×4): qty 3
  Filled 2017-08-25: qty 36
  Filled 2017-08-25 (×7): qty 3

## 2017-08-25 MED ORDER — LABETALOL HCL 5 MG/ML IV SOLN
5.0000 mg | INTRAVENOUS | Status: DC | PRN
  Filled 2017-08-25: qty 4

## 2017-08-25 MED ORDER — PHENOL 1.4 % MT LIQD
1.0000 | OROMUCOSAL | Status: DC | PRN
  Administered 2017-08-25: 1 via OROMUCOSAL
  Filled 2017-08-25: qty 177

## 2017-08-25 MED ORDER — LATANOPROST 0.005 % OP SOLN
1.0000 [drp] | Freq: Every day | OPHTHALMIC | Status: DC
  Administered 2017-08-25 – 2017-08-27 (×3): 1 [drp] via OPHTHALMIC
  Filled 2017-08-25: qty 2.5

## 2017-08-25 MED ORDER — DORZOLAMIDE HCL-TIMOLOL MAL 2-0.5 % OP SOLN
1.0000 [drp] | Freq: Two times a day (BID) | OPHTHALMIC | Status: DC
  Administered 2017-08-25 – 2017-08-28 (×7): 1 [drp] via OPHTHALMIC
  Filled 2017-08-25: qty 10

## 2017-08-25 MED ORDER — HYDRALAZINE HCL 20 MG/ML IJ SOLN
10.0000 mg | INTRAMUSCULAR | Status: DC | PRN
  Administered 2017-08-25: 10 mg via INTRAVENOUS
  Filled 2017-08-25: qty 1

## 2017-08-25 MED ORDER — METOPROLOL TARTRATE 5 MG/5ML IV SOLN
5.0000 mg | INTRAVENOUS | Status: DC | PRN
  Administered 2017-08-26 – 2017-08-28 (×7): 5 mg via INTRAVENOUS
  Filled 2017-08-25 (×8): qty 5

## 2017-08-25 MED ORDER — ORAL CARE MOUTH RINSE
15.0000 mL | Freq: Two times a day (BID) | OROMUCOSAL | Status: DC
  Administered 2017-08-26 – 2017-08-28 (×5): 15 mL via OROMUCOSAL

## 2017-08-25 MED ORDER — METOPROLOL TARTRATE 5 MG/5ML IV SOLN
2.5000 mg | INTRAVENOUS | Status: DC | PRN
  Administered 2017-08-25 – 2017-08-26 (×3): 2.5 mg via INTRAVENOUS
  Filled 2017-08-25 (×2): qty 5

## 2017-08-25 MED ORDER — ASPIRIN 325 MG PO TABS
325.0000 mg | ORAL_TABLET | Freq: Every day | ORAL | Status: DC
  Administered 2017-08-25: 325 mg via ORAL
  Filled 2017-08-25: qty 1

## 2017-08-25 MED ORDER — FUROSEMIDE 10 MG/ML IJ SOLN
40.0000 mg | Freq: Two times a day (BID) | INTRAMUSCULAR | Status: DC
  Administered 2017-08-25 – 2017-08-30 (×11): 40 mg via INTRAVENOUS
  Filled 2017-08-25 (×11): qty 4

## 2017-08-25 MED ORDER — METOPROLOL TARTRATE 25 MG PO TABS
25.0000 mg | ORAL_TABLET | Freq: Two times a day (BID) | ORAL | Status: DC
  Administered 2017-08-25: 25 mg via ORAL
  Filled 2017-08-25: qty 1

## 2017-08-25 NOTE — Procedures (Signed)
Extubation Procedure Note  Patient Details:   Name: Thomas Hanna DOB: 09-Feb-1924 MRN: [id number]   Airway Documentation:     Evaluation  O2 sats: stable throughout Complications: No apparent complications Patient did tolerate procedure well. Bilateral Breath Sounds: Rhonchi, Diminished   No   Positive cuff leak noted. Pt placed on [place] 4 L with humidity, no stridor noted.  Pt's family at bedside.  Bayard Beaver 08/25/2017, 12:40 PM

## 2017-08-25 NOTE — Progress Notes (Signed)
[facility] Progress Note Patient Name: Thomas Hanna DOB: Feb 04, 1924 MRN: [id number]   Date of Service  08/25/2017  HPI/Events of Note  AFIB with RVR - HR now = 108 - 116. Metoprolol 25 mg PO Q 12 hours. NPO and not able to take Metoprolol PO.  eICU Interventions  Will order: 1. Metoprolol 2.5 mg IV Q 3 hours PRN HR > 115. Hold for SBP < 110.     Intervention Category Major Interventions: Arrhythmia - evaluation and management  Ilissa Rosner Eugene 08/25/2017, 10:52 PM

## 2017-08-25 NOTE — Progress Notes (Signed)
[facility] Progress Note Patient Name: Thomas Hanna DOB: Dec 05, 1923 MRN: [id number]   Date of Service  08/25/2017  HPI/Events of Note  Hypertension - BP = 171/101.   eICU Interventions  Will order: 1. Hydralazine 10 mg IV Q 4 hours PRN SBP > 170 or SBP . 100.     Intervention Category Major Interventions: Hypertension - evaluation and management  Thomas Hanna 08/25/2017, 4:27 AM

## 2017-08-25 NOTE — Plan of Care (Signed)
Pt extubated today.  On 4L [place].  Voice weak and unable to vocalize.  F/c on left.  NPO at this time.  No s/s of any acute distress.

## 2017-08-25 NOTE — Progress Notes (Signed)
Nutrition Follow-up  INTERVENTION:   No nutrition interventions warranted at this time.  Monitor for needs.   NUTRITION DIAGNOSIS:   Inadequate oral intake related to inability to eat as evidenced by NPO status. Ongoing.   GOAL:   Patient will meet greater than or equal to 90% of their needs Not met.   MONITOR:   Vent status, I & O's  ASSESSMENT:   Pt with PMH of CAD, prostate Ca, CVA admitted with new afib, ICH s/p tPA and thrombolysis.   Pt discussed during ICU rounds and with RN.  1/14 plan for one-way extubation. Per MD if does well will need SLP  Weight increased by 20 lb, Pt is 11 L positive  Medications reviewed Labs reviewed: K+ 3.4 (L), PO4: 2.3 (L) CBG's: [phone]  Diet Order:  Diet NPO time specified  EDUCATION NEEDS:   No education needs have been identified at this time  Skin:  Skin Assessment: Skin Integrity Issues: Skin Integrity Issues:: Stage I Stage I: buttocks  Last BM:  1/13 medium  Height:   Ht Readings from Last 1 Encounters:  08/19/17 _0  (1.88 m)    Weight:   Wt Readings from Last 1 Encounters:  08/25/17 202 lb 2.6 oz (91.7 kg)    Ideal Body Weight:  86.3 kg  BMI:  Body mass index is 25.96 kg/m.  Estimated Nutritional Needs:   Kcal:  1900-2100  Protein:  105-115 grams  Fluid:  > 1.9 L/day   Maylon Peppers RD, LDN, CNSC [phone] Pager [phone] After Hours Pager

## 2017-08-25 NOTE — Progress Notes (Addendum)
Progress Note  Patient Name: Thomas Hanna Date of Encounter: 08/25/2017  Primary Cardiologist: Shelva Majestic, MD   Subjective   Pt extubated  Nursing says he responds to some commands    Inpatient Medications    Scheduled Meds: . aspirin  325 mg Oral Daily  . chlorhexidine gluconate (MEDLINE KIT)  15 mL Mouth Rinse BID  . dorzolamide-timolol  1 drop Both Eyes BID  . glycopyrrolate  0.4 mg Intravenous Q4H  . latanoprost  1 drop Both Eyes QHS  . mouth rinse  15 mL Mouth Rinse 10 times per day  . metoprolol tartrate  25 mg Oral BID  . pantoprazole sodium  40 mg Per Tube Daily   Continuous Infusions: . sodium chloride 75 mL/hr at 08/25/17 1300  . ampicillin-sulbactam (UNASYN) IV Stopped (08/25/17 1248)  . diltiazem (CARDIZEM) infusion 10 mg/hr (08/25/17 1300)  . fentaNYL infusion INTRAVENOUS Stopped (08/21/17 1312)  . vancomycin Stopped (08/25/17 1427)   PRN Meds: acetaminophen **OR** acetaminophen (TYLENOL) oral liquid 160 mg/5 mL **OR** acetaminophen, bisacodyl, labetalol, midazolam, morphine injection, ondansetron, senna-docusate   Vital Signs    Vitals:   08/25/17 1100 08/25/17 1200 08/25/17 1235 08/25/17 1300  BP: 127/65 (!) 166/87 (!) 166/87 137/76  Pulse: 92 (!) 103 (!) 125 97  Resp: (!) 26 (!) 29 (!) 36 (!) 31  Temp:  97.7 F (36.5 C)    TempSrc:  Axillary    SpO2: 100% 100% 94% 94%  Weight:      Height:        Intake/Output Summary (Last 24 hours) at 08/25/2017 1453 Last data filed at 08/25/2017 1300 Gross per 24 hour  Intake 3232.92 ml  Output 1225 ml  Net 2007.92 ml   Filed Weights   08/23/17 0425 08/24/17 0413 08/25/17 0451  Weight: 193 lb 5.5 oz (87.7 kg) 195 lb 12.3 oz (88.8 kg) 202 lb 2.6 oz (91.7 kg)    Telemetry    Afib   80s to 110s  Average below 100 - Personally Reviewed  ECG     Physical Exam   GEN: Pt wheezing   Neck: Neck full  JVP increased Cardiac:  Irreg irreg  , no murmurs, rubs, or gallops.  Respiratory: Diffuse  wheezes  Rales at bases   GI: Soft, nontender, non-distended  GI  Scotal edema   MS: 1+  edema; No deformity. Neuro: Deferred   Labs    Chemistry Recent Labs  Lab 08/18/17 2151  08/23/17 0614 08/24/17 0344 08/25/17 0421  NA 135   < > 141 143 145  K 3.8   < > 3.9 3.1* 3.4*  CL 103   < > 111 112* 113*  CO2 26   < > 20* 20* 22  GLUCOSE 147*   < > 145* 134* 123*  BUN 28*   < > 23* 20 21*  CREATININE 0.76   < > 0.69 0.56* 0.60*  CALCIUM 8.9   < > 8.2* 8.2* 8.0*  PROT 6.7  --   --   --   --   ALBUMIN 4.0  --   --   --   --   AST 23  --   --   --   --   ALT 22  --   --   --   --   ALKPHOS 65  --   --   --   --   BILITOT 0.8  --   --   --   --  GFRNONAA >60   < > >60 >60 >60  GFRAA >60   < > >60 >60 >60  ANIONGAP 6   < > 10 11 10    < > = values in this interval not displayed.     Hematology Recent Labs  Lab 08/23/17 0614 08/24/17 0344 08/25/17 0421  WBC 16.7* 16.8* 17.3*  RBC 3.49* 3.61* 3.68*  HGB 10.5* 10.9* 10.8*  HCT 32.4* 33.1* 34.1*  MCV 92.8 91.7 92.7  MCH 30.1 30.2 29.3  MCHC 32.4 32.9 31.7  RDW 14.1 13.9 14.0  PLT 216 236 257    Cardiac EnzymesNo results for input(s): TROPONINI in the last 168 hours.  Recent Labs  Lab 08/18/17 2202  TROPIPOC 0.01     BNPNo results for input(s): BNP, PROBNP in the last 168 hours.   DDimer No results for input(s): DDIMER in the last 168 hours.   Radiology    Dg Chest Port 1 View  Result Date: 08/24/2017 CLINICAL DATA:  ET tube EXAM: PORTABLE CHEST 1 VIEW COMPARISON:  08/22/2017 FINDINGS: Cardiomegaly with vascular congestion and worsening bilateral airspace opacities, most confluent in the right perihilar and infrahilar region and left mid and lower lung. Findings could reflect edema or infection. Support devices are unchanged. IMPRESSION: Worsening bilateral airspace disease which could reflect edema or infection. Electronically Signed   By: Rolm Baptise M.D.   On: 08/24/2017 07:35    Cardiac Studies      Patient Profile       Assessment & Plan    1  Atrial fib  Persistent  Rates fair  Continue IV meds (metoprolol, diltiazem) Anticoagulate when safe from neuro [place]   2  Acute diastolic CHF  LVEF 29%  Mild MR   Pt is 12.5 L positive   Will start IV lasix   Follow exam, BP, renal function   3  CAD   I am not convinced of active angina     For questions or updates, please contact [facility] Please consult [email or web address] for contact info under Cardiology/STEMI.      Signed, Dorris Carnes, MD  08/25/2017, 2:53 PM

## 2017-08-25 NOTE — Progress Notes (Signed)
NEUROHOSPITALISTS STROKE TEAM - DAILY PROGRESS NOTE   SUBJECTIVE (INTERVAL HISTORY) His son is  at the bedside. Patient still intubated, but more awake alert today. Still not following commands but able to move LUE and LLE spontaneously. Right side hemiplegia. On weaning trial today, plan to one way extubation this am.      OBJECTIVE Lab Results: CBC:  Recent Labs  Lab 08/23/17 0614 08/24/17 0344 08/25/17 0421  WBC 16.7* 16.8* 17.3*  HGB 10.5* 10.9* 10.8*  HCT 32.4* 33.1* 34.1*  MCV 92.8 91.7 92.7  PLT 216 236 257   BMP: Recent Labs  Lab 08/21/17 0445 08/21/17 1712 08/22/17 0535 08/23/17 0614 08/24/17 0344 08/25/17 0421  NA 139  --  140 141 143 145  K 3.7  --  3.3* 3.9 3.1* 3.4*  CL 111  --  118* 111 112* 113*  CO2 21*  --  16* 20* 20* 22  GLUCOSE 122*  --  133* 145* 134* 123*  BUN 17  --  20 23* 20 21*  CREATININE 0.58*  --  0.62 0.69 0.56* 0.60*  CALCIUM 7.8*  --  6.4* 8.2* 8.2* 8.0*  MG 1.9 2.4 1.9 2.3 2.1 2.2  PHOS 2.4* 2.0* 1.7*  --  1.9* 2.3*   Liver Function Tests:  Recent Labs  Lab 08/18/17 2151  AST 23  ALT 22  ALKPHOS 65  BILITOT 0.8  PROT 6.7  ALBUMIN 4.0   Coagulation Studies:  No results for input(s): APTT, INR in the last 72 hours. PHYSICAL EXAM Temp:  [97.7 F (36.5 C)-99 F (37.2 C)] 97.7 F (36.5 C) (01/14 1200) Pulse Rate:  [51-125] 97 (01/14 1300) Resp:  [14-36] 31 (01/14 1300) BP: (125-183)/(65-117) 137/76 (01/14 1300) SpO2:  [94 %-100 %] 94 % (01/14 1300) FiO2 (%):  [40 %] 40 % (01/14 1200) Weight:  [202 lb 2.6 oz (91.7 kg)] 202 lb 2.6 oz (91.7 kg) (01/14 0451) General - Well nourished, well developed, in no apparent distress, Intubated and sedated HEENT-  Normocephalic, Normal external eye/conjunctiva.  Normal external ears. Normal external nose, mucus membranes and septum.   Cardiovascular - Regular rate and rhythm  Respiratory - Lungs clear bilaterally. No  wheezing. Abdomen - soft and non-tender, BS normal Extremities- no edema or cyanosis NEURO:  Mental Status: Intubated and off sedation. Eyes open, more awake alert, but not following commands, likely aphasia. Right side neglect Cranial Nerves: PERRL gaze preference to the left, slight hypertropia of both eyes unable to cross the midline.  No blink to threat from right site, right lower facial weakness. Motor: 0/5 right upper extremity, minimal withdrawal to noxious stimulus in the right lower extremity, 3/5 left upper extremity, 3+ LLE. Tone: is decreased and bulk is normal DTR 1+ and no babinski Sensation, coordination and gait not tested.  IMAGING: I have personally reviewed the radiological images below and agree with the radiology interpretations.  Ct Head Wo Contrast Result Date: 08/19/2017 IMPRESSION: Loss of gray-white differentiation within the insula and frontal operculum compatible with evolving acute infarction. No additional area of infarction identified at this time. No intracranial hemorrhage or mass effect.  CTA Head/Neck and Cerebral Perfusion W Contrast Result Date: 08/18/2017 IMPRESSION:  CTA neck: 1. Patent carotid and vertebral arteries of the neck. 2. Dense calcified plaque of bilateral carotid bifurcations with proximal ICA moderate 60-70% stenosis. 3. Moderate stenosis of right vertebral artery origin. CTA head: 1. Distal left M1 occlusion extending into left M2 superior division. Early reconstitution of left M2 inferior division via collateralization. Poor downstream collateralization and left superior MCA distribution. 2. Left paraclinoid ICA severe stenosis. 3. Right paraclinoid and terminal ICA occlusion or near occlusion. Contrast opacification of the right upper cervical ICA diminishes gradually to the terminal segment, severe stenosis was present on the prior study. Findings may represent chronic severe stenosis with poor antegrade flow or interval thrombosis. CT brain  perfusion: 1. Core infarct volume of 71 cc. 2. Mismatch ischemic penumbra of 102 cc. 3. Infarct location left superior MCA distribution.   Portable Chest Xray Result Date: 08/19/2017 IMPRESSION: Endotracheal tube 4.8 cm from carina. Aortic atherosclerosis. No acute pulmonary process identified.   Dg Abd Portable 1v Result Date: 08/19/2017 IMPRESSION: The esophagogastric tube tip and proximal port of appear to lie in the stomach.   Ct Head Code Stroke Wo Contrast Result Date: 08/18/2017 IMPRESSION: 1. No acute stroke, hemorrhage, or mass effect identified. 2. Density in left M2 superior division, possible thrombus. 3. ASPECTS is 10 4. Stable chronic microvascular ischemic changes and parenchymal volume loss of the brain.   Echocardiogram:                                               Study Conclusions - Left ventricle: Posterior basal hypokinesis. Wall thickness was   increased in a pattern of mild LVH. The estimated ejection   fraction was 55%. The study is not technically sufficient to   allow evaluation of LV diastolic function. - Mitral valve: Moderately calcified annulus. Moderately thickened,   moderately calcified leaflets . There was mild regurgitation. - Left atrium: The atrium was mildly dilated. - Atrial septum: No defect or patent foramen ovale was identified.  MRI BRAIN:  1. Patchy multifocal acute ischemic infarcts involving the bilateral cerebral hemispheres, with additional 1 cm left cerebellar infarct as above. Associated petechial hemorrhage at the left frontal operculum without hemorrhagic transformation. 2. Underlying mild chronic microvascular ischemic disease.      IMPRESSION: [name] is a 84 y.o. male with PMH of coronary artery disease, orthostatic hypotension, prostate cancer, left thalamic stroke with mild residual right hemiparesis since January 2018, who presented to the emergency room as an acute code stroke for sudden onset of right-sided  weakness, left-sided gaze preference and a aphasia with an NIH stroke scale of 27. Noncontrast CT of the head did not show any bleed.  IV TPA was administered after reviewing contraindications with the son over the phone. Head and neck vessel imaging reveals left M1/M2 superior division occlusion.  Deemed to be a good candidate for endovascular treatment given less than 6 hours from presentation and a favorable perfusion profile on the CT perfusion as well. Neuro endovascular was consulted and the patient was taken for IAT.   Acute ischemic strokes in multiple vascular distributions status post IV TPA and mechanical thrombectomy Suspected Etiology: likely cardio embolic given new onset AFIB   Resultant Symptoms:  right-sided weakness, left-sided gaze preference and a aphasia. Stroke Risk Factors: hyperlipidemia and hypertension, AFIB Other Stroke Risk Factors: Advanced age, Hx stroke, CAD, Prostate Cancer, Hx  of MI  PROCEDURES:  08/19/2017  Dr Estanislado Pandy S/P Lt common carotid arteriogram followed by revascularization of Lt MCA distal M1 and mid perisylvian M3 branch  with mechanical thrombolysis achieving a TICI 2 b reperfusion.   08/25/2017 ASSESSMENT:   Remains intubated and sedated. Aphasic, not following commands. Moving LUE spontaneously and occasionally LLE. No spontaneous movement of Right side. +withdrawal to pain. No family at bedside.  PLAN  08/25/2017: Start ASA . [facility] consultation for new onset AFIB CCM to attempt extubation when medically ready Maintain strict B/P parameters Frequent neuro checks Telemetry monitoring PT/OT/SLP Consult PM & Rehab Consult Case Management /MSW Ongoing aggressive stroke risk factor management Patient will be counseled to be compliant with his antithrombotic medications Patient will be counseled on Lifestyle modifications including, Diet, Exercise, and Stress Follow up with [facility] in 6 weeks  HX OF  STROKES: Left thalamic stroke with mild residual right hemiparesis since January 2018  INTRACRANIAL Atherosclerosis &Stenosis: May consider DAPT if appropriate at discharge  DYSPHAGIA: NPO until passes SLP swallow evaluation Aspiration Precautions in progress  AFIB, NEW ONSET: EKG shows A. Fib, episodes of Bradycardia overnight Cardiology Consultation, Appreciate assistance Continue to HOLD [facility] - for now Will need long-term anticoagulation once safe from a stroke standpoint, if appropriate Repeat CT in 2 weeks for consideration of starting anticoagulation if CT is stable.   MEDICAL ISSUES: CCM following, appreciate assistance Inability to protect airway in the postoperative setting Remains intubated/sedated Wean Fentanyl and SBT's   Mild anemia, at baseline  Hgb 9.9 / Hct 31.2 - stable this morning Repeat CBC in AM  HYPERTENSION: Change blood pressure goal below 110 2180. Wean and taper norepinephrine drip. Discontinue arterial line Long term BP goal normotensive. Home Meds: NONE  HYPERLIPIDEMIA:    Component Value Date/Time   CHOL 140 08/19/2017 0304   TRIG 86 08/19/2017 0304   HDL 36 (L) 08/19/2017 0304   CHOLHDL 3.9 08/19/2017 0304   VLDL 17 08/19/2017 0304   LDLCALC 87 08/19/2017 0304  Home Meds:  Lovaza LDL  goal < 70 Will continued Lovaza and start Lipitor to 10 mg daily, if passes SLP evaluation Continue statin at discharge, if appropriate  R/O DIABETES: Lab Results  Component Value Date   HGBA1C 5.7 (H) 08/19/2017  HgbA1c goal < 7.0  Other Active Problems: Principal Problem:   Acute ischemic left MCA stroke (HCC) Active Problems:   Coronary artery disease due to lipid rich plaque   Normocytic anemia   Coronary artery disease involving coronary bypass graft of native heart without angina pectoris   Pressure injury of skin   Atrial fibrillation with rapid ventricular response (HCC)   Bilateral carotid artery stenosis   Aspiration pneumonia (HCC)    Ileus (HCC)   Endotracheally intubated   Hypokalemia   Hypertension   Palliative care by specialist   Goals of care, counseling/discussion    Hospital day # 7 VTE prophylaxis: SCD's  Diet : Diet NPO time specified   FAMILY UPDATES: No family at bedside  TEAM UPDATES: Rosalin Hawking, MD STATUS:  FULL   Prior Home Stroke Medications:  clopidogrel 75 mg daily  Discharge Stroke Meds:  Please discharge patient on TBD   Disposition: 01-Home or Self Care Therapy Recs:               PENDING Home Equipment:         PENDING Follow Up:  Follow-up Information    Garvin Fila, MD. Schedule an  appointment as soon as possible for a visit in 6 week(s).   Specialties:  Neurology, Radiology Contact information: [address] [place] [address] [place] Not In -PCP Follow up in 1-2 weeks   Case Management aware of need     ATTENDING NOTE:  I have personally examined this patient, reviewed notes, independently viewed imaging studies, participated in medical decision making and plan of care.ROS completed by me personally and pertinent positives fully documented  I have made any additions or clarifications directly to the above note.   84 year old male with history of CAD, prostate cancer, left thalamic stroke with right-sided weakness in 08/2016 admitted for right-sided weakness, left gaze and aphasia.  Status post TPA.  CT head and neck showed left M1 occlusion, bilateral ICA proximal 60-70% stenosis.  Right terminal ICA occlusion or near occlusion.  Had mechanical thrombectomy with TICI2b reperfusion.  Found to have A. fib with RVR, put on Cardizem IV.  MRI showed left cerebellum, left MCA, ACA and right MCA patchy moderate sized infarcts.  Stroke consistent with embolic stroke due to newly diagnosed A. fib.  EF 55%, LDL 87 and A1c 5.7.    Patient still intubated, off sedation, still on cardizem for afib RVR, will add metoprolol of HR control. on  aspirin, and IVF.  CCM on board, on weaning trials, plan to extubate this am.  Still on antibiotics for leukocytosis and aspiration.   Rosalin Hawking, MD PhD Stroke Neurology 08/25/2017 2:07 PM  This patient is critically ill and at significant risk of neurological worsening, death and care requires constant monitoring of vital signs, hemodynamics,respiratory and cardiac monitoring, extensive review of multiple databases, frequent neurological assessment, discussion with family, other specialists and medical decision making of high complexity. I had long discussion with son at bedside, updated pt current condition, treatment plan and potential prognosis. Discussed with Dr. Carson Myrtle. I spent 40 minutes of neurocritical care time  in the care of  this patient.       To contact Stroke Continuity provider, please refer to [email or web address] After hours, contact General Neurology

## 2017-08-25 NOTE — Progress Notes (Signed)
Daily Progress Note   Patient Name: Thomas Hanna       Date: 08/25/2017 DOB: [birth year]  Age: 84 y.o. MRN#: [id number] Attending Physician: Rosalin Hawking, MD Primary Care Physician: System, Pcp Not In Admit Date: 08/18/2017  Reason for Consultation/Follow-up: Establishing goals of care  Subjective: Patient remains on ventilator. Minimally responsive during my visit. Per son, patient was awake, alert, and following simple commands this morning. "He's better in the mornings."   GOC:  Introduced myself to son [name]) and DIL Leafy Ro) at bedside. They share how independent their father was before the stroke. Discussed hospital diagnoses and interventions. Plan is for extubation today. [name] remains hopeful since he has been weaning on vent for two days. He also has a good understanding his dad could decompensate after extubation, which we will then focus on comfort measures. At this point, [name] states "hoping for the best" but "also prepared for the worst" for which I agreed. We discussed if he tolerates extubation, next steps will be SLP/PT/OT evaluations. [name] has PMT contact information and I encouraged him to call me if questions or concerns today.   Length of Stay: 7  Current Medications: Scheduled Meds:  . aspirin  325 mg Oral Daily  . chlorhexidine gluconate (MEDLINE KIT)  15 mL Mouth Rinse BID  . dorzolamide-timolol  1 drop Both Eyes BID  . glycopyrrolate  0.4 mg Intravenous Q4H  . latanoprost  1 drop Both Eyes QHS  . mouth rinse  15 mL Mouth Rinse 10 times per day  . metoprolol tartrate  25 mg Oral BID  . pantoprazole sodium  40 mg Per Tube Daily    Continuous Infusions: . sodium chloride 75 mL/hr at 08/25/17 1221  . ampicillin-sulbactam (UNASYN) IV Stopped (08/25/17 1248)    . diltiazem (CARDIZEM) infusion 10 mg/hr (08/25/17 1200)  . fentaNYL infusion INTRAVENOUS Stopped (08/21/17 1312)  . vancomycin Stopped (08/25/17 1427)    PRN Meds: acetaminophen **OR** acetaminophen (TYLENOL) oral liquid 160 mg/5 mL **OR** acetaminophen, bisacodyl, labetalol, midazolam, morphine injection, ondansetron, senna-docusate  Physical Exam  Constitutional: He is easily aroused. He appears ill. He is intubated.  HENT:  Head: Normocephalic and atraumatic.  Cardiovascular: An irregularly irregular rhythm present.  Afib  Pulmonary/Chest: No accessory muscle usage. No tachypnea. He is intubated. No respiratory distress. He has decreased breath sounds.  Abdominal: Normal appearance. There is no tenderness.  Neurological: He is easily aroused.  Follows some simple commands per family. Minimally responsive during my visit.   Skin: Skin is warm and dry. There is pallor.  Psychiatric: He is inattentive.  Nursing note and vitals reviewed.           Vital Signs: BP (!) 166/87   Pulse (!) 125   Temp 97.7 F (36.5 C) (Axillary)   Resp (!) 36   Ht 6' 2"  (1.88 m)   Wt 91.7 kg (202 lb 2.6 oz)   SpO2 94%   BMI 25.96 kg/m  SpO2: SpO2: 94 % O2 Device: O2 Device: Nasal Cannula O2 Flow Rate: O2 Flow Rate (L/min): 4 L/min  Intake/output summary:   Intake/Output Summary (Last 24 hours) at 08/25/2017 1249 Last data filed at 08/25/2017 1227 Gross per 24 hour  Intake 3497.92 ml  Output 1330 ml  Net 2167.92 ml   LBM: Last BM Date: 08/24/17 Baseline Weight: Weight: 82.9 kg (182 lb 12.2 oz) Most recent weight: Weight: 91.7 kg (202 lb 2.6 oz)       Palliative Assessment/Data:  PPS 30%   Flowsheet Rows     Most Recent Value  Intake Tab  Referral Department  Critical care  Unit at Time of Referral  ICU  Palliative Care Primary Diagnosis  Neurology  Date Notified  08/24/17  Palliative Care Type  New Palliative care  Reason for referral  Clarify Goals of Care  Date of Admission   08/18/17  Date first seen by Palliative Care  08/24/17  # of days Palliative referral response time  0 Day(s)  # of days IP prior to Palliative referral  6  Clinical Assessment  Palliative Performance Scale Score  30%  Pain Max last 24 hours  Not able to report  Pain Min Last 24 hours  Not able to report  Dyspnea Max Last 24 Hours  Not able to report  Dyspnea Min Last 24 hours  Not able to report  Nausea Max Last 24 Hours  Not able to report  Nausea Min Last 24 Hours  Not able to report  Anxiety Max Last 24 Hours  Not able to report  Anxiety Min Last 24 Hours  Not able to report  Other Max Last 24 Hours  Not able to report  Psychosocial & Spiritual Assessment  Palliative Care Outcomes  Patient/Family meeting held?  Yes  Who was at the meeting?  son and DIL  Patient/Family wishes: Interventions discontinued/not started   Lurline Idol, [facility]  Palliative Care follow-up planned  Yes, Facility      Patient Active Problem List   Diagnosis Date Noted  . Palliative care by specialist   . Hypokalemia 08/22/2017  . Hypertension 08/22/2017  . Aspiration pneumonia ([place]) 08/21/2017  . Ileus ([place]) 08/21/2017  . Endotracheally intubated   . Bilateral carotid artery stenosis 08/20/2017    Class: Chronic  . Pressure injury of skin 08/19/2017  . Atrial fibrillation with rapid ventricular response ([place])   . Acute ischemic left MCA stroke ([place]) 08/18/2017  . Cervical paraspinal muscle spasm 02/18/2017  . MCI (mild cognitive impairment) 10/22/2016  . Orthostatic hypertension 09/13/2016  . Coronary artery disease involving coronary bypass graft of native heart without angina pectoris 09/13/2016  . Abnormal MRI of head 09/13/2016  . Orthostatic hypotension 09/03/2016  . CAD S/P percutaneous coronary angioplasty 09/03/2016  . Hx of CABG 09/03/2016  . Dyslipidemia 09/03/2016  . Coronary artery disease due to lipid  rich plaque 08/29/2016  . Hyponatremia 08/29/2016  . Elevated LFTs 08/29/2016  .  Normocytic anemia 08/29/2016  . Syncope 08/28/2016    Palliative Care Assessment & Plan   Patient Profile: 84 y.o. male  with past medical history of left thalamic stroke (January 2018), atrial fib, hypertension, hyperlipidemia admitted on 08/18/2017 with right-sided weakness, left-sided gaze preference, aphasia.  Last known well at 1900 on 08/18/2017.  He presented to the emergency department as a code stroke and was given IV TPA on 08/18/2016.  Additionally, he underwent thrombo-lysis on 08/19/2017.  He has been intubated and is in ICU. Consult ordered for goals of care and potentially one-way extubation.   Assessment: Acute CVA-left MCA occlusion Acute respiratory failure Afib RVR Bilateral carotid artery stenosis Aspiration pneumonia CAD  Recommendations/Plan:  Extubation today per PCCM.   Watchful waiting. If he remains stable after extubation, will need SLP/PT/OT consults.   If he declines, focus will transition to comfort measures only.   Son has PMT contact information and understands he can call for support.   PMT will continue to follow.   Code Status:  DNR   Code Status Orders  (From admission, onward)        Start     Ordered   08/20/17 1411  Do not attempt resuscitation (DNR)  Continuous    Question Answer Comment  In the event of cardiac or respiratory ARREST Do not call a "code blue"   In the event of cardiac or respiratory ARREST Do not perform Intubation, CPR, defibrillation or ACLS   In the event of cardiac or respiratory ARREST Use medication by any route, position, wound care, and other measures to relive pain and suffering. May use oxygen, suction and manual treatment of airway obstruction as needed for comfort.      08/20/17 1410    Code Status History    Date Active Date Inactive Code Status Order ID Comments User Context   08/19/2017 02:18 08/20/2017 14:10 Full Code [id number]  Luanne Bras, MD Inpatient   08/18/2017 22:37 08/19/2017 02:18 Full Code  [id number]  Amie Portland, MD ED   09/03/2016 16:05 09/09/2016 19:20 Full Code [id number]  Erlene Quan, PA-C ED   08/29/2016 00:39 08/29/2016 16:48 Full Code [id number]  Edwin Dada, MD Inpatient       Prognosis:   Unable to determine  Discharge Planning:  To Be Determined  Care plan was discussed with  Son and DIL at bedside, RN, and Dr. Carson Myrtle  Thank you for allowing the Palliative Medicine Team to assist in the care of this patient.   Time In: 1050 Time Out: 1130 Total Time 94mn Prolonged Time Billed  no      Greater than 50%  of this time was spent counseling and coordinating care related to the above assessment and plan.  MIhor Dow FNP-C Palliative Medicine Team  Phone: [phone]: [phone] Please contact Palliative Medicine Team phone at [phone] questions and concerns.

## 2017-08-25 NOTE — Progress Notes (Signed)
PULMONARY / CRITICAL CARE MEDICINE   Name: Thomas Hanna MRN: [id number] DOB: October 13, 1923    ADMISSION DATE:  08/18/2017 CONSULTATION DATE: August 19, 2017  REFERRING MD: Dr. Leonie Man  CHIEF COMPLAINT: Stroke   SUBJECTIVE:  -Interim events: No events overnight, weaning this AM. "One-way" extubation planned for today.  HISTORY OF PRESENT ILLNESS:   This 84 year old male is seen in consultation at the request of Dr. Leonie Man previous left thalamic stroke with mild residual right hemiparesis(January 2018) presented to [facility] emergency department on 1/7 with complaints of right-sided weakness, left-sided gaze preference, and aphasia.  Last known well at 1900 on 1/7.  He presented to the emergency department as a code stroke and was given IV tPA after noncontrast CT was negative for acute bleed.  Further cerebral imaging demonstrated left distal M1/proximal M2 occlusion.  He was taken to interventional radiology and underwent mechanical thrombolysis.  Post procedurally he was sent to the ICU for recovery and remained on the ventilator.  PCCM asked to consult.  OBJECTIVE:  VITAL SIGNS: BP 139/88   Pulse 79   Temp 98.3 F (36.8 C) (Axillary)   Resp (!) 23   Ht 6\' 2"  (1.88 m)   Wt 91.7 kg (202 lb 2.6 oz)   SpO2 100%   BMI 25.96 kg/m   VENTILATOR SETTINGS: Vent Mode: PSV;CPAP FiO2 (%):  [40 %] 40 % Set Rate:  [12 bmp] 12 bmp Vt Set:  [650 mL] 650 mL PEEP:  [5 cmH20] 5 cmH20 Pressure Support:  [5 cmH20] 5 cmH20 Plateau Pressure:  [3 cmH20-25 cmH20] 3 cmH20  INTAKE / OUTPUT: I/O last 3 completed shifts: In: 6948 [I.V.:2775; IV Piggyback:1700] Out: 5462 [VOJJK:0938; Emesis/NG output:50]  PHYSICAL EXAMINATION: General:  Intubated, following some commands Neuro:  Intubated, not moving on the right, restrained on the left HEENT:  [place]/AT, PERRL, EOM-I and MMM Cardiovascular:  IRIR, Nl S1/S2 and -M/R/G. Lungs:  CTA bilaterally Abdomen:  Soft, distended, NT and +BS Musculoskeletal:  No  acute deformity or ROM limitation Skin:  Grossly intact  LABS:  BMET Recent Labs  Lab 08/23/17 0614 08/24/17 0344 08/25/17 0421  NA 141 143 145  K 3.9 3.1* 3.4*  CL 111 112* 113*  CO2 20* 20* 22  BUN 23* 20 21*  CREATININE 0.69 0.56* 0.60*  GLUCOSE 145* 134* 123*    Electrolytes Recent Labs  Lab 08/22/17 0535 08/23/17 0614 08/24/17 0344 08/25/17 0421  CALCIUM 6.4* 8.2* 8.2* 8.0*  MG 1.9 2.3 2.1 2.2  PHOS 1.7*  --  1.9* 2.3*    CBC Recent Labs  Lab 08/23/17 0614 08/24/17 0344 08/25/17 0421  WBC 16.7* 16.8* 17.3*  HGB 10.5* 10.9* 10.8*  HCT 32.4* 33.1* 34.1*  PLT 216 236 257    Coag's Recent Labs  Lab 08/18/17 2151  APTT 30  INR 1.04    Sepsis Markers No results for input(s): LATICACIDVEN, PROCALCITON, O2SATVEN in the last 168 hours.  ABG Recent Labs  Lab 08/19/17 0345 08/24/17 0424  PHART 7.437 7.465*  PCO2ART 33.7 31.1*  PO2ART 406* 78.8*    Liver Enzymes Recent Labs  Lab 08/18/17 2151  AST 23  ALT 22  ALKPHOS 65  BILITOT 0.8  ALBUMIN 4.0    Cardiac Enzymes No results for input(s): TROPONINI, PROBNP in the last 168 hours.  Glucose Recent Labs  Lab 08/24/17 1127 08/24/17 1543 08/24/17 2000 08/24/17 2344 08/25/17 0414 08/25/17 0754  GLUCAP 121* 123* 125* 114* 112* 110*    Imaging No results found.  STUDIES:  CT head code stroke 1/7> no acute stroke, hemorrhage, or mass-effect identified.  Density in the left M2 superior division. CT Angie of head 1/7> distal left M1 occlusion extending into the left M2 superior division.  Bilateral paraclinoid ICA stenosis with near occlusion on the right.  CULTURES: Results for orders placed or performed during the hospital encounter of 08/18/17  MRSA PCR Screening     Status: None   Collection Time: 08/19/17  2:33 AM  Result Value Ref Range Status   MRSA by PCR NEGATIVE NEGATIVE Final    Comment:        The GeneXpert MRSA Assay (FDA approved for NASAL specimens only), is one  component of a comprehensive MRSA colonization surveillance program. It is not intended to diagnose MRSA infection nor to guide or monitor treatment for MRSA infections.     ANTIBIOTICS: Perioperative cefazolin   SIGNIFICANT EVENTS: 1/7 presented with acute stroke, IV TPA given 1/8 taken to IR for mechanical thrombolysis.  Recovered in ICU on vent 1/14 one-way extubation  LINES/TUBES: Endotracheal tube 1/8>  DISCUSSION: 84 year old male presented as code stroke found to have Left MCA CVA. Taken to IR for revascularization. Post-op remained on vent. PCCM asked to see.   ASSESSMENT / PLAN:  PULMONARY A: ASPIRATION PNEUMONIA ENDOTRACHEAL INTUBATION for airway protection. Inability to protect airway in the postoperative setting P: Continue Unasyn/vancomycin. Titrate O2 for sat of 88-92%   CARDIOVASCULAR A: Hypertension Atrial fibrillation with rapid ventricular response Coronary artery disease Internal carotid artery stenosis P: ICU hemodynamic monitoring On Cardizem gtt. Watch for bradycardia with initiation of Precedex gtt for sedation BP goals per neurology (SBP of 120-140) Holding home midodrine. D/C heparin, will defer to neurology  RENAL A: No acute issues P: Monitor renal function considering contrast load.  BMET in AM Replace electrolytes as indicated KVO IVF  GASTROINTESTINAL A: NONOBSTRUCTIVE ILEUS  P: GI prophylaxis with Protonix Continue Reglan. If survives extubation will need SLP  HEMATOLOGIC A:  Mild anemia, Hgb 10.8 P:  Antiplatelet therapy per stroke team.    INFECTIOUS A: ASPIRATION PNEUMONIA P:  Unasyn/vancomycin No culture data. Will stop antibiotics after 7 day course   ENDOCRINE/METABOLIC A:  hypokalemia P:  Replete as needed Follow glucose on BMP  NEUROLOGIC A:  Acute CVA Left MCA occlusion.  s/p IR revascularization 1/8 P:  SCDs. Stop DVT chemoprophylaxis (heparin) Case discussed with Dr. Erlinda Hong.  FAMILY  - Updates: See  above for discussion with the son  - Inter-disciplinary family meet or Palliative Care meeting due by:  1/14  The patient is critically ill with multiple organ systems failure and requires high complexity decision making for assessment and support, frequent evaluation and titration of therapies, application of advanced monitoring technologies and extensive interpretation of multiple databases.    Renee Pain, MD  [place]. Pager: [phone]. After hours pager: [phone].

## 2017-08-26 ENCOUNTER — Inpatient Hospital Stay (HOSPITAL_COMMUNITY): Payer: Medicare Other

## 2017-08-26 DIAGNOSIS — R0602 Shortness of breath: Secondary | ICD-10-CM

## 2017-08-26 DIAGNOSIS — Z515 Encounter for palliative care: Secondary | ICD-10-CM

## 2017-08-26 DIAGNOSIS — Z9189 Other specified personal risk factors, not elsewhere classified: Secondary | ICD-10-CM

## 2017-08-26 DIAGNOSIS — G8191 Hemiplegia, unspecified affecting right dominant side: Secondary | ICD-10-CM

## 2017-08-26 DIAGNOSIS — R4701 Aphasia: Secondary | ICD-10-CM

## 2017-08-26 LAB — BASIC METABOLIC PANEL
Anion gap: 15 (ref 5–15)
Anion gap: 17 — ABNORMAL HIGH (ref 5–15)
BUN: 19 mg/dL (ref 6–20)
BUN: 19 mg/dL (ref 6–20)
CALCIUM: 8.1 mg/dL — AB (ref 8.9–10.3)
CHLORIDE: 104 mmol/L (ref 101–111)
CO2: 26 mmol/L (ref 22–32)
CO2: 25 mmol/L (ref 22–32)
CREATININE: 0.74 mg/dL (ref 0.61–1.24)
CREATININE: 0.81 mg/dL (ref 0.61–1.24)
Calcium: 8.2 mg/dL — ABNORMAL LOW (ref 8.9–10.3)
Chloride: 104 mmol/L (ref 101–111)
GFR calc Af Amer: >60 mL/min (ref >60)
GFR calc non Af Amer: >60 mL/min (ref >60)
GFR calc non Af Amer: >60 mL/min (ref >60)
Glucose, Bld: 134 mg/dL — ABNORMAL HIGH (ref 65–99)
Glucose, Bld: 115 mg/dL — ABNORMAL HIGH (ref 65–99)
POTASSIUM: 2.7 mmol/L — AB (ref 3.5–5.1)
Potassium: 2.3 mmol/L — CL (ref 3.5–5.1)
Sodium: 145 mmol/L (ref 135–145)
Sodium: 146 mmol/L — ABNORMAL HIGH (ref 135–145)

## 2017-08-26 LAB — CBC
HEMATOCRIT: 37.1 % — AB (ref 39.0–52.0)
HEMOGLOBIN: 12 g/dL — AB (ref 13.0–17.0)
MCH: 29.6 pg (ref 26.0–34.0)
MCHC: 32.3 g/dL (ref 30.0–36.0)
MCV: 91.4 fL (ref 78.0–100.0)
Platelets: 285 10*3/uL (ref 150–400)
RBC: 4.06 MIL/uL — ABNORMAL LOW (ref 4.22–5.81)
RDW: 14 % (ref 11.5–15.5)
WBC: 21.7 10*3/uL — ABNORMAL HIGH (ref 4.0–10.5)

## 2017-08-26 LAB — MAGNESIUM
Magnesium: 1.9 mg/dL (ref 1.7–2.4)
Magnesium: 1.9 mg/dL (ref 1.7–2.4)

## 2017-08-26 LAB — GLUCOSE, CAPILLARY
GLUCOSE-CAPILLARY: 121 mg/dL — AB (ref 65–99)
GLUCOSE-CAPILLARY: 123 mg/dL — AB (ref 65–99)

## 2017-08-26 LAB — PHOSPHORUS: PHOSPHORUS: 3.1 mg/dL (ref 2.5–4.6)

## 2017-08-26 MED ORDER — DILTIAZEM HCL 100 MG IV SOLR
5.0000 mg/h | INTRAVENOUS | Status: DC
  Administered 2017-08-26: 5 mg/h via INTRAVENOUS
  Administered 2017-08-27: 10 mg/h via INTRAVENOUS
  Administered 2017-08-27: 12.5 mg/h via INTRAVENOUS
  Administered 2017-08-27: 15 mg/h via INTRAVENOUS
  Administered 2017-08-27: 12.5 mg/h via INTRAVENOUS
  Administered 2017-08-28: 15 mg/h via INTRAVENOUS
  Filled 2017-08-26 (×7): qty 100

## 2017-08-26 MED ORDER — POTASSIUM CHLORIDE 10 MEQ/100ML IV SOLN
10.0000 meq | INTRAVENOUS | Status: AC
  Administered 2017-08-26 (×6): 10 meq via INTRAVENOUS
  Filled 2017-08-26 (×6): qty 100

## 2017-08-26 MED ORDER — ALBUTEROL SULFATE (2.5 MG/3ML) 0.083% IN NEBU: 2.5000 mg | INHALATION_SOLUTION | RESPIRATORY_TRACT | Status: DC | PRN

## 2017-08-26 MED ORDER — CHLORHEXIDINE GLUCONATE 0.12 % MT SOLN
OROMUCOSAL | Status: AC
  Filled 2017-08-26: qty 15

## 2017-08-26 NOTE — Progress Notes (Signed)
Progress Note  Patient Name: Thomas Hanna Date of Encounter: 08/26/2017  Primary Cardiologist: Shelva Majestic, MD   Subjective   Patient awake  Working with speech therapy for swallow eval.    Inpatient Medications    Scheduled Meds: . aspirin  325 mg Oral Daily  . chlorhexidine gluconate (MEDLINE KIT)  15 mL Mouth Rinse BID  . dorzolamide-timolol  1 drop Both Eyes BID  . furosemide  40 mg Intravenous Q12H  . glycopyrrolate  0.4 mg Intravenous Q4H  . ipratropium-albuterol  3 mL Nebulization Q6H  . latanoprost  1 drop Both Eyes QHS  . mouth rinse  15 mL Mouth Rinse BID  . metoprolol tartrate  25 mg Oral BID  . pantoprazole sodium  40 mg Per Tube Daily   Continuous Infusions: . ampicillin-sulbactam (UNASYN) IV Stopped (08/26/17 0716)   PRN Meds: acetaminophen **OR** acetaminophen (TYLENOL) oral liquid 160 mg/5 mL **OR** acetaminophen, albuterol, bisacodyl, labetalol, metoprolol tartrate, metoprolol tartrate, ondansetron, phenol, senna-docusate   Vital Signs    Vitals:   08/26/17 0303 08/26/17 0350 08/26/17 0548 08/26/17 0757  BP:  (!) 166/107 (!) 155/103   Pulse:  (!) 134 (!) 101   Resp:   (!) 28   Temp:   98.1 F (36.7 C)   TempSrc:   Axillary   SpO2: 91% 97% 92% 96%  Weight:      Height:        Intake/Output Summary (Last 24 hours) at 08/26/2017 0934 Last data filed at 08/25/2017 2330 Gross per 24 hour  Intake 1280 ml  Output 3600 ml  Net -2320 ml   Filed Weights   08/23/17 0425 08/24/17 0413 08/25/17 0451  Weight: 193 lb 5.5 oz (87.7 kg) 195 lb 12.3 oz (88.8 kg) 202 lb 2.6 oz (91.7 kg)    Telemetry    Atrial fib  120s to 140s - Personally Reviewed  ECG     Physical Exam   GEN: Pt coughing    Neck: Neck full   Cardiac:  Irreg irreg  , no murmurs, rubs, or gallops.  Respiratory: Rales at bases  Rhonchi.  Mild wheeze GI: Soft, nontender, non-distended  GI  Scotal edema   MS: 1+  edema; No deformity. Neuro:  Deferred  Psych  Awake  Labs      Chemistry Recent Labs  Lab 08/23/17 0614 08/24/17 0344 08/25/17 0421  NA 141 143 145  K 3.9 3.1* 3.4*  CL 111 112* 113*  CO2 20* 20* 22  GLUCOSE 145* 134* 123*  BUN 23* 20 21*  CREATININE 0.69 0.56* 0.60*  CALCIUM 8.2* 8.2* 8.0*  GFRNONAA >60 >60 >60  GFRAA >60 >60 >60  ANIONGAP 10 11 10      Hematology Recent Labs  Lab 08/24/17 0344 08/25/17 0421 08/26/17 0820  WBC 16.8* 17.3* 21.7*  RBC 3.61* 3.68* 4.06*  HGB 10.9* 10.8* 12.0*  HCT 33.1* 34.1* 37.1*  MCV 91.7 92.7 91.4  MCH 30.2 29.3 29.6  MCHC 32.9 31.7 32.3  RDW 13.9 14.0 14.0  PLT 236 257 285    Cardiac EnzymesNo results for input(s): TROPONINI in the last 168 hours.  No results for input(s): TROPIPOC in the last 168 hours.   BNPNo results for input(s): BNP, PROBNP in the last 168 hours.   DDimer No results for input(s): DDIMER in the last 168 hours.   Radiology    No results found.  Cardiac Studies     Patient Profile       Assessment &  Plan    1  Atrial fib  Persistent  Rates high because he is no longer on IV dilt.  Cannot take po yet until swallow eval complete  Getting intermitt IV lopressor  Not adequate Needs IV control with dilt   2  Acute diastolic CHF  LVEF 20%  Mild MR   Pt has diuresed some  Still with signif volume overload  Would continue lasix   3  CAD  Again, Iam not convinced of active angina  4  Neuro Awake today  Working with speech for swallowing  Still cant take PO  5  Increased WBC   Up from yesterday  Afebrile  WIll need to be followed   REcomm tx to unit that can provide above level of care . For questions or updates, please contact [facility] Please consult [email or web address] for contact info under Cardiology/STEMI.      Signed, Dorris Carnes, MD  08/26/2017, 9:34 AM

## 2017-08-26 NOTE — Progress Notes (Signed)
PULMONARY / CRITICAL CARE MEDICINE   Name: Thomas Hanna MRN: [id number] DOB: 1924-01-04    ADMISSION DATE:  08/18/2017 CONSULTATION DATE: August 19, 2017  REFERRING MD: Dr. Leonie Man  CHIEF COMPLAINT: Stroke   SUBJECTIVE:  -Interim events: No events overnight, weaning this AM. "One-way" extubation planned for today.  HISTORY OF PRESENT ILLNESS:   This 84 year old male is seen in consultation at the request of Dr. Leonie Man previous left thalamic stroke with mild residual right hemiparesis(January 2018) presented to [facility] emergency department on 1/7 with complaints of right-sided weakness, left-sided gaze preference, and aphasia.  Last known well at 1900 on 1/7.  He presented to the emergency department as a code stroke and was given IV tPA after noncontrast CT was negative for acute bleed.  Further cerebral imaging demonstrated left distal M1/proximal M2 occlusion.  He was taken to interventional radiology and underwent mechanical thrombolysis.  Post procedurally he was sent to the ICU for recovery and remained on the ventilator.  PCCM asked to consult.  Subjective:  Has required deep suctioning since extubation A fib + RVR, tachycardic last 18 hours  OBJECTIVE:  VITAL SIGNS: BP (!) 161/82 (BP Location: Left Arm)   Pulse (!) 114   Temp 97.6 F (36.4 C) (Oral)   Resp (!) 24   Ht 6\' 2"  (1.88 m)   Wt 91.7 kg (202 lb 2.6 oz)   SpO2 93%   BMI 25.96 kg/m   VENTILATOR SETTINGS:    INTAKE / OUTPUT: I/O last 3 completed shifts: In: 3197.9 [I.V.:1637.9; NG/GT:60; IV Piggyback:1500] Out: 7673 [Urine:4325; Emesis/NG output:50]  PHYSICAL EXAMINATION: General: ill appearing man Neuro:  R is flacid, L weak but moves, marginally effective cough, apahsic  HEENT: some UA noise, secretions.  Cardiovascular: Irregular, tachycardic  Lungs:  Clear B  Abdomen:  Soft, distended, NT and +BS Musculoskeletal:  No acute deformity or ROM limitation Skin:  Grossly intact  LABS:  BMET Recent  Labs  Lab 08/24/17 0344 08/25/17 0421 08/26/17 0820  NA 143 145 145  K 3.1* 3.4* 2.3*  CL 112* 113* 104  CO2 20* 22 26  BUN 20 21* 19  CREATININE 0.56* 0.60* 0.74  GLUCOSE 134* 123* 134*    Electrolytes Recent Labs  Lab 08/24/17 0344 08/25/17 0421 08/26/17 0820  CALCIUM 8.2* 8.0* 8.1*  MG 2.1 2.2 1.9  PHOS 1.9* 2.3* 3.1    CBC Recent Labs  Lab 08/24/17 0344 08/25/17 0421 08/26/17 0820  WBC 16.8* 17.3* 21.7*  HGB 10.9* 10.8* 12.0*  HCT 33.1* 34.1* 37.1*  PLT 236 257 285    Coag's No results for input(s): APTT, INR in the last 168 hours.  Sepsis Markers No results for input(s): LATICACIDVEN, PROCALCITON, O2SATVEN in the last 168 hours.  ABG Recent Labs  Lab 08/24/17 0424  PHART 7.465*  PCO2ART 31.1*  PO2ART 78.8*    Liver Enzymes No results for input(s): AST, ALT, ALKPHOS, BILITOT, ALBUMIN in the last 168 hours.  Cardiac Enzymes No results for input(s): TROPONINI, PROBNP in the last 168 hours.  Glucose Recent Labs  Lab 08/25/17 0414 08/25/17 0754 08/25/17 1139 08/25/17 1600 08/25/17 2043 08/26/17 0128  GLUCAP 112* 110* 130* 142* 111* 121*    Imaging No results found.  STUDIES:  CT head code stroke 1/7> no acute stroke, hemorrhage, or mass-effect identified.  Density in the left M2 superior division. CT Angie of head 1/7> distal left M1 occlusion extending into the left M2 superior division.  Bilateral paraclinoid ICA stenosis with near occlusion  on the right.  CULTURES: Results for orders placed or performed during the hospital encounter of 08/18/17  MRSA PCR Screening     Status: None   Collection Time: 08/19/17  2:33 AM  Result Value Ref Range Status   MRSA by PCR NEGATIVE NEGATIVE Final    Comment:        The GeneXpert MRSA Assay (FDA approved for NASAL specimens only), is one component of a comprehensive MRSA colonization surveillance program. It is not intended to diagnose MRSA infection nor to guide or monitor treatment  for MRSA infections.     ANTIBIOTICS: Perioperative cefazolin   SIGNIFICANT EVENTS: 1/7 presented with acute stroke, IV TPA given 1/8 taken to IR for mechanical thrombolysis.  Recovered in ICU on vent 1/14 one-way extubation  LINES/TUBES: Endotracheal tube 1/8>  DISCUSSION: 84 year old male presented as code stroke found to have Left MCA CVA. Taken to IR for revascularization. Post-op remained on vent. PCCM asked to see.   ASSESSMENT / PLAN:  PULMONARY A: ASPIRATION PNEUMONIA ENDOTRACHEAL INTUBATION for airway protection. Inability to protect airway in the postoperative setting and now since extubation due to CVA P:  Continue Unasyn, complete 7 days total, day 6 of 7 today Push pulm hygiene with chest vest, suctioning prn No reintubation > this would commit him to long term support Titrate O2 for sat of 88-92%   CARDIOVASCULAR A: Hypertension Atrial fibrillation with rapid ventricular response Coronary artery disease Internal carotid artery stenosis P: ICU hemodynamic monitoring On Cardizem gtt. Watch for bradycardia with initiation of Precedex gtt for sedation Planning to restart diltiazem   INFECTIOUS A: ASPIRATION PNEUMONIA P:  Completes 7 days course unasyn on 1/16  NEUROLOGIC A:  Acute CVA Left MCA occlusion.  s/p IR revascularization 1/8 P:   I discussed with dr Erlinda Hong this am 1/15  FAMILY  - Updates: updated family at bedside 1/15  PCC<M will sign off. Please call if we can assist  Baltazar Apo, MD, PhD 08/26/2017, 12:46 PM [place] [facility] [phone] or if no answer [phone]

## 2017-08-26 NOTE — Evaluation (Signed)
Clinical/Bedside Swallow Evaluation Patient Details  Name: Thomas Hanna MRN: [id number] Date of Birth: 1924-05-01  Today's Date: 08/26/2017 Time: SLP Start Time (ACUTE ONLY): 0950 SLP Stop Time (ACUTE ONLY): 1018 SLP Time Calculation (min) (ACUTE ONLY): 28 min  Past Medical History:  Past Medical History:  Diagnosis Date  . Coronary artery disease   . MI (myocardial infarction) ([place])   . Orthostatic hypotension 08/2016  . Prostate cancer ([place])   . Stroke [facility])    Past Surgical History:  Past Surgical History:  Procedure Laterality Date  . cardiac stents    . CARDIAC SURGERY    . CORONARY ARTERY BYPASS GRAFT    . IR ANGIO EXTRACRAN SEL COM CAROTID INNOMINATE UNI R MOD SED  08/19/2017  . IR ANGIO VERTEBRAL SEL SUBCLAVIAN INNOMINATE UNI R MOD SED  08/19/2017  . IR PERCUTANEOUS ART THROMBECTOMY/INFUSION INTRACRANIAL INC DIAG ANGIO  08/19/2017  . RADIOLOGY WITH ANESTHESIA N/A 08/18/2017   Procedure: RADIOLOGY WITH ANESTHESIA;  Surgeon: Luanne Bras, MD;  Location: [place];  Service: Radiology;  Laterality: N/A;   HPI:  84 y.o.malewith past medical history of left thalamic stroke (January 2018), atrial fib, hypertension, hyperlipidemiaadmitted on1/7/2019with right-sided weakness, left-sided gaze preference, aphasia. He presented to the emergency department as a code stroke and was given IV TPA on 08/18/2016. Additionally, he underwent thrombo-lysis on 08/19/2017. He has been intubated and is in ICU.Palliative care consulted on 1/14 with resulting one way extubation. Pt has struggled with management of secretions overnight with episodes of hypoxia. Initial CXR clear, most recent shows Worsening bilateral airspace disease which could reflect edema or infection. No prior SLP evaluations documented.    Assessment / Plan / Recommendation Clinical Impression  Pt demonstrates severely impaired swallow mechanism secondary to multiple impairments. Primary concern are thick/solid pharyngeal  secretions and mucous plugs that pt cannot fully mobilize. Oral mucosa is severely dry, saliva is absent and pt is breathing though his mouth. Oral care improved oral hydration briefly, but dried out quickly. Pt is aware of ice chips, creates a partial labial seal, masticates and initiates a very weak swallow likely secondary to both dryness, generalized weakness and possibly neuromuscular pharyngeal impairment. AFter being given mulitple trials of ice pt was able to begin mobilizing secretions with deep pharyngeal suction applied. In order to achieve any progress, pt will need opportunities to swallow, produce saliva, mobilize secretions and clear airway. Providing ice will also lead to further microaspiration events which pose a risk of worsening pulmonary hygiene. Discussed this risk with family at bedside as well as the detriment of strict NPO status for this pt over continued days. Family is willing to accept risk and begin offering trials of ice, 2-3 pieces per hour with tooth brushing 3 times days. Pt to otherwise remian NPO another day. Will f/u tomorrow for potential trials of liquids and puree or MBS depending on progress. If NG tube can be avoided for another day pt may be more successful.  SLP Visit Diagnosis: Dysphagia, oropharyngeal phase (R13.12)    Aspiration Risk  Severe aspiration risk;Risk for inadequate nutrition/hydration    Diet Recommendation Ice chips PRN after oral care   Supervision: Full supervision/cueing for compensatory strategies(family may give ice chips)    Other  Recommendations Oral Care Recommendations: Oral care QID Other Recommendations: Have oral suction available   Follow up Recommendations        Frequency and Duration min 2x/week  2 weeks       Prognosis  Swallow Study   General HPI: 84 y.o.malewith past medical history of left thalamic stroke (January 2018), atrial fib, hypertension, hyperlipidemiaadmitted on1/7/2019with right-sided  weakness, left-sided gaze preference, aphasia. He presented to the emergency department as a code stroke and was given IV TPA on 08/18/2016. Additionally, he underwent thrombo-lysis on 08/19/2017. He has been intubated and is in ICU.Palliative care consulted on 1/14 with resulting one way extubation. Pt has struggled with management of secretions overnight with episodes of hypoxia. Initial CXR clear, most recent shows Worsening bilateral airspace disease which could reflect edema or infection. No prior SLP evaluations documented.  Type of Study: Bedside Swallow Evaluation Diet Prior to this Study: NPO Temperature Spikes Noted: No Respiratory Status: Nasal cannula History of Recent Intubation: Yes Length of Intubations (days): 6 days Date extubated: 08/25/17 Behavior/Cognition: Alert;Cooperative;Requires cueing;Doesn't follow directions Oral Care Completed by SLP: Yes Oral Cavity - Dentition: Adequate natural dentition Self-Feeding Abilities: Total assist Patient Positioning: Upright in bed Baseline Vocal Quality: Not observed Volitional Cough: Weak;Congested    Oral/Motor/Sensory Function Overall Oral Motor/Sensory Function: Generalized oral weakness(doesnt follow oral motor commands)   Ice Chips Ice chips: Impaired Presentation: Spoon Oral Phase Impairments: Reduced labial seal Oral Phase Functional Implications: Left anterior spillage(slightly left leaning) Pharyngeal Phase Impairments: Cough - Immediate;Cough - Delayed;Suspected delayed Swallow;Decreased hyoid-laryngeal movement   Thin Liquid Thin Liquid: Impaired Presentation: Spoon Oral Phase Impairments: Reduced labial seal Pharyngeal  Phase Impairments: Cough - Immediate;Suspected delayed Swallow;Decreased hyoid-laryngeal movement    Nectar Thick Nectar Thick Liquid: Not tested   Honey Thick Honey Thick Liquid: Not tested   Puree Puree: Not tested   Solid   GO   Solid: Not tested       Thomas Baltimore, MA CCC-SLP  [phone]  Ingri Hanna, [name] 08/26/2017,10:27 AM

## 2017-08-26 NOTE — Progress Notes (Signed)
PT Cancellation Note  Patient Details Name: Thomas Hanna MRN: [id number] DOB: [birth year]   Cancelled Treatment:    Reason Eval/Treat Not Completed: Medical issues which prohibited therapy Holding PT today per RN as pt transferring back to ICU. Pt with A-fib with RVR with continued high rates and concern for aspiration. Debating comfort care. Palliative are following. Will follow up as appropriate.    Marguarite Arbour A Trevionne Advani 08/26/2017, 2:59 PM  Wray Kearns, [place], DPT [phone]

## 2017-08-26 NOTE — Progress Notes (Addendum)
Daily Progress Note   Patient Name: Thomas Hanna       Date: 08/26/2017 DOB: 05-24-1924  Age: 84 y.o. MRN#: [id number] Attending Physician: Rosalin Hawking, MD Primary Care Physician: System, Pcp Not In Admit Date: 08/18/2017  Reason for Consultation/Follow-up: Establishing goals of care  Subjective: Patient awake, alert, follows commands with LUE, and will nod yes/no to simple questions. Attempting to talk. Denies pain or discomfort. On 6L [place].    GOC:  After update from Dr. Erlinda Hong, met with son Thomas Hanna) and DIL Thomas Hanna) at bedside. They share many frustrations in the last 24 hours including the patient being "shipped out" of ICU to "die" and also feeling like "no one cares about him besides [place]." Thomas Hanna continues to share his thoughts on his father's cognitive improvement and ability to follow simple commands. He confirms no resuscitation or further intubation but wanting to "give him a chance." Like yesterday, he states "preparing for the worst but hoping for the best."   Thomas Hanna shares that he spoke with his father last night, showed him a picture of his wife (who is deceased), and told Mr. Dull he would be "ok" if he was "ready to go." Upon mentioning this, the patient become angry and shook his head no. Thomas Hanna believes his father is "not ready to give up."   The plan for today is transfer back to ICU for diuresis and cardizem gtt. Again Thomas Hanna confirms DNR/DNI but continue with medical management. SLP worked with patient today. He is a high aspiration risk. Family going to try ice chips every few hours per speech recommendations. They are hopeful that patient will be able to work with speech again tomorrow.   Emotional and spiritual support provided. [place] copy left. Encouraged Thomas Hanna to  read and consider big decisions he is faced with after his father's stroke with new baseline deficits.   Length of Stay: 8  Current Medications: Scheduled Meds:  . aspirin  325 mg Oral Daily  . chlorhexidine gluconate (MEDLINE KIT)  15 mL Mouth Rinse BID  . dorzolamide-timolol  1 drop Both Eyes BID  . furosemide  40 mg Intravenous Q12H  . glycopyrrolate  0.4 mg Intravenous Q4H  . ipratropium-albuterol  3 mL Nebulization Q6H  . latanoprost  1 drop Both Eyes QHS  . mouth rinse  15 mL  Mouth Rinse BID  . metoprolol tartrate  25 mg Oral BID  . pantoprazole sodium  40 mg Per Tube Daily    Continuous Infusions: . ampicillin-sulbactam (UNASYN) IV 3 g (08/26/17 1119)  . potassium chloride 10 mEq (08/26/17 1158)    PRN Meds: acetaminophen **OR** acetaminophen (TYLENOL) oral liquid 160 mg/5 mL **OR** acetaminophen, albuterol, bisacodyl, labetalol, metoprolol tartrate, metoprolol tartrate, ondansetron, phenol, senna-docusate  Physical Exam  Constitutional: He is cooperative. He appears ill.  HENT:  Head: Normocephalic and atraumatic.  Cardiovascular: An irregularly irregular rhythm present.  Afib  Pulmonary/Chest: No accessory muscle usage. No tachypnea. No respiratory distress. He has rhonchi.  Audible secretions. On 6L  Abdominal: Normal appearance. There is no tenderness.  Neurological: He is alert.  Alert, follows commands on left side. Nods head yes/no to simple questions. Attempting to speak during my visit.   Skin: Skin is warm and dry. There is pallor.  Psychiatric: He is noncommunicative.  Nursing note and vitals reviewed.          Vital Signs: BP (!) 161/82 (BP Location: Left Arm)   Pulse (!) 114   Temp 97.6 F (36.4 C) (Oral)   Resp (!) 24   Ht 6' 2"  (1.88 m)   Wt 91.7 kg (202 lb 2.6 oz)   SpO2 93%   BMI 25.96 kg/m  SpO2: SpO2: 93 % O2 Device: O2 Device: High Flow Nasal Cannula O2 Flow Rate: O2 Flow Rate (L/min): 6 L/min  Intake/output summary:   Intake/Output  Summary (Last 24 hours) at 08/26/2017 1259 Last data filed at 08/25/2017 2330 Gross per 24 hour  Intake 425 ml  Output 3300 ml  Net -2875 ml   LBM: Last BM Date: 08/24/17 Baseline Weight: Weight: 82.9 kg (182 lb 12.2 oz) Most recent weight: Weight: 91.7 kg (202 lb 2.6 oz)       Palliative Assessment/Data:  PPS 30%   Flowsheet Rows     Most Recent Value  Intake Tab  Referral Department  Critical care  Unit at Time of Referral  ICU  Palliative Care Primary Diagnosis  Neurology  Date Notified  08/24/17  Palliative Care Type  New Palliative care  Reason for referral  Clarify Goals of Care  Date of Admission  08/18/17  Date first seen by Palliative Care  08/24/17  # of days Palliative referral response time  0 Day(s)  # of days IP prior to Palliative referral  6  Clinical Assessment  Palliative Performance Scale Score  30%  Pain Max last 24 hours  Not able to report  Pain Min Last 24 hours  Not able to report  Dyspnea Max Last 24 Hours  Not able to report  Dyspnea Min Last 24 hours  Not able to report  Nausea Max Last 24 Hours  Not able to report  Nausea Min Last 24 Hours  Not able to report  Anxiety Max Last 24 Hours  Not able to report  Anxiety Min Last 24 Hours  Not able to report  Other Max Last 24 Hours  Not able to report  Psychosocial & Spiritual Assessment  Palliative Care Outcomes  Patient/Family meeting held?  Yes  Who was at the meeting?  son and DIL  Patient/Family wishes: Interventions discontinued/not started   Lurline Idol, [facility]  Palliative Care follow-up planned  Yes, Facility      Patient Active Problem List   Diagnosis Date Noted  . Goals of care, counseling/discussion   . Paroxysmal atrial fibrillation (HCC)   .  Palliative care by specialist   . Hypokalemia 08/22/2017  . Hypertension 08/22/2017  . Aspiration pneumonia ([place]) 08/21/2017  . Ileus ([place]) 08/21/2017  . Endotracheally intubated   . Bilateral carotid artery stenosis 08/20/2017    Class: Chronic   . Pressure injury of skin 08/19/2017  . Atrial fibrillation with RVR ([place])   . Acute ischemic left MCA stroke ([place]) 08/18/2017  . Cervical paraspinal muscle spasm 02/18/2017  . MCI (mild cognitive impairment) 10/22/2016  . Orthostatic hypertension 09/13/2016  . Coronary artery disease involving coronary bypass graft of native heart without angina pectoris 09/13/2016  . Abnormal MRI of head 09/13/2016  . Orthostatic hypotension 09/03/2016  . CAD S/P percutaneous coronary angioplasty 09/03/2016  . Hx of CABG 09/03/2016  . Dyslipidemia 09/03/2016  . Coronary artery disease due to lipid rich plaque 08/29/2016  . Hyponatremia 08/29/2016  . Elevated LFTs 08/29/2016  . Normocytic anemia 08/29/2016  . Syncope 08/28/2016    Palliative Care Assessment & Plan   Patient Profile: 84 y.o. male  with past medical history of left thalamic stroke (January 2018), atrial fib, hypertension, hyperlipidemia admitted on 08/18/2017 with right-sided weakness, left-sided gaze preference, aphasia.  Last known well at 1900 on 08/18/2017.  He presented to the emergency department as a code stroke and was given IV TPA on 08/18/2016.  Additionally, he underwent thrombo-lysis on 08/19/2017.  He has been intubated and is in ICU. Extubated 1/14. Consult ordered for goals of care.  Assessment: Acute CVA-left MCA occlusion Acute respiratory failure Afib RVR Bilateral carotid artery stenosis Aspiration pneumonia CAD  Recommendations/Plan:  DNR. No re-intubation if respiratory status decompensates.   Transfer back to ICU per Dr. Erlinda Hong for diuresis and cardizem gtt. Family remains hopeful with improvement in cognitive status. They want to "give him a chance."   Ongoing SLP/PT/OT evaluations. High risk for aspiration.   PMT will continue to support patient/family through hospitalization.   Code Status:  DNR   Code Status Orders  (From admission, onward)        Start     Ordered   08/20/17 1411  Do not attempt  resuscitation (DNR)  Continuous    Question Answer Comment  In the event of cardiac or respiratory ARREST Do not call a "code blue"   In the event of cardiac or respiratory ARREST Do not perform Intubation, CPR, defibrillation or ACLS   In the event of cardiac or respiratory ARREST Use medication by any route, position, wound care, and other measures to relive pain and suffering. May use oxygen, suction and manual treatment of airway obstruction as needed for comfort.      08/20/17 1410    Code Status History    Date Active Date Inactive Code Status Order ID Comments User Context   08/19/2017 02:18 08/20/2017 14:10 Full Code [id number]  Luanne Bras, MD Inpatient   08/18/2017 22:37 08/19/2017 02:18 Full Code [id number]  Amie Portland, MD ED   09/03/2016 16:05 09/09/2016 19:20 Full Code [id number]  Erlene Quan, PA-C ED   08/29/2016 00:39 08/29/2016 16:48 Full Code [id number]  Edwin Dada, MD Inpatient       Prognosis:   Unable to determine: guarded with acute ischemic CVA, acute respiratory failure, and high aspiration risk.   Discharge Planning:  To Be Determined  Care plan was discussed with  Son and DIL at bedside, Dr. Erlinda Hong  Thank you for allowing the Palliative Medicine Team to assist in the care of this patient.   Time In: 1130 Time  Out: 1210 Total Time 67mn Prolonged Time Billed  no      Greater than 50%  of this time was spent counseling and coordinating care related to the above assessment and plan.  MIhor Dow FNP-C Palliative Medicine Team  Phone: [phone]: [phone] Please contact Palliative Medicine Team phone at [phone] questions and concerns.

## 2017-08-26 NOTE — Progress Notes (Signed)
Patient NTS by RT at this time. Post CPT. Minimal secretions obtained. Vitals stable.

## 2017-08-26 NOTE — Progress Notes (Signed)
[name] DOB: 1924-05-28 MRN: [id number]   Date of Service  08/26/2017  HPI/Events of Note  Episode of hypoxia - most likely related to inability to cough up pulmonary secretions and +/- recurrent aspiration. Patient extubated earlier today with plan not to reintubate the patient should he fail. Sat now = 95% with Vest therapy and NT suctioning. Spoke with son, [name], who understands that the situation is not going in a good direction, however, he states that his dad is not ready to give up yet. I told the son that I expected that the patient was likely to continue to struggle with oral secretions, recurrent aspiration and difficulty mobilizing respiratory secretions.   eICU Interventions  Continue aggressive pulmonary toilet and NT suctioning for now.      Intervention Category Major Interventions: Hypoxemia - evaluation and management  Takuma Cifelli [place] 08/26/2017, 1:29 AM

## 2017-08-26 NOTE — Progress Notes (Addendum)
Pt. Received from 4N at [place]. Pt. Noted to be tachypneic w/ RR 40's-50's.  Nasal flaring noted.  Pt with pale skin but no cyanosis noted.  Pt with weak cough and rhonchi auscultated in upper and lower lobes with crackles bilaterally to bases.  Oxygen demands increased from 4L [place] to 6L [place] with oxygen saturations 91% increasing to 95%.  MD notified of hypoxic event.  RT at bedside to perform chest PT and give scheduled duoneb.  Pt. NT suctioned by respiratory therapy with production of thick white sputum.  Dr. Oletta Darter notified.  Per MD continue pulmonary toilet and NT suctioning at this time.

## 2017-08-26 NOTE — Progress Notes (Addendum)
Called by Charge RN to assess patient respiratory distress. Patient was just transferred from [facility] ICU.  Code Status was DNR.  Patient was on the CT vest when I arrived, tolerated the treatment well.  Lung sounds were diminished, rhonchi, + aspiration (has been aspirating, s/p stroke). BP is stable, HR is AF rate 110-120s, PRN Lopressor ordered if needed.   Patient was given a DUO NEB and oxygen is being administering 6L [place], sats improved immediately and maintained, oxygen weaned down to 4L. Patient was NTS and after that, RR improved, WOB of breathing also improved.  I did call [facility] MD and updated him on the status of patient. I felt that the patient needed some Morphine for dyspnea and discomfort. MD and I reviewed the chart, MD spoke with the patient's son who was at bedside as well. Patient's son did not want Morphine to be given right now.  Patient has a very weak cough, frequent oral secretions that he cannot safely cough up and expel.  I feel like this has been going on since patient got extubated yesterday and will continue, patient is already getting medications to help with secretion management. Patient is not a candidate for BIPAP.   Plan: -- Pulmonary Toileting and if needed NTS/deep suctioning.   Call Time 0043 End Time 115

## 2017-08-26 NOTE — Progress Notes (Signed)
PT Cancellation Note  Patient Details Name: Thomas Hanna MRN: [id number] DOB: 05/04/1924   Cancelled Treatment:    Reason Eval/Treat Not Completed: Patient at procedure or test/unavailable Pt off floor for test. Will follow up as time allows.   [name] 08/26/2017, 10:06 AM Wray Kearns, PT, DPT [phone]

## 2017-08-26 NOTE — Care Management Note (Signed)
Case Management Note  Patient Details  Name: Thomas Hanna MRN: [id number] Date of Birth: 06-30-1924  Subjective/Objective:                    Action/Plan: Plan is for patient to transfer back to ICU/SDU. CM following.  Expected Discharge Date:                  Expected Discharge Plan:     In-House Referral:     Discharge planning Services  CM Consult  Post Acute Care Choice:    Choice offered to:     DME Arranged:    DME Agency:     HH Arranged:    HH Agency:     Status of Service:  In process, will continue to follow  If discussed at Long Length of Stay Meetings, dates discussed:    Additional Comments:  Pollie Friar, RN 08/26/2017, 2:05 PM

## 2017-08-26 NOTE — Progress Notes (Signed)
Pharmacy Antibiotic Note  Thomas Hanna is a 83 y.o. male admitted on 08/18/2017 with a stroke. On day 6 Unasyn for aspiration pneumonia. Renal function is stable.  Plan: Continue Unasyn 3gm IV q8h - stop date set for 7 days total Pharmacy signing off - please re-consult if needed   Height: 6\' 2"  (188 cm) Weight: 202 lb 2.6 oz (91.7 kg) IBW/kg (Calculated) : 82.2  Temp (24hrs), Avg:97.8 F (36.6 C), Min:97.6 F (36.4 C), Max:98.1 F (36.7 C)  Recent Labs  Lab 08/22/17 0535 08/23/17 0614 08/24/17 0344 08/24/17 1058 08/25/17 0421 08/26/17 0820  WBC 11.1* 16.7* 16.8*  --  17.3* 21.7*  CREATININE 0.62 0.69 0.56*  --  0.60* 0.74  VANCOTROUGH  --   --   --  11*  --   --     Estimated Creatinine Clearance: 69.9 mL/min (by C-G formula based on SCr of 0.74 mg/dL).    Allergies  Allergen Reactions  . Sulfa Antibiotics Other (See Comments)    Unknown    Renold Genta, PharmD, BCPS Clinical Pharmacist Phone for today - [place] - [phone] 08/26/2017 1:43 PM

## 2017-08-26 NOTE — Care Management Important Message (Signed)
Important Message  Patient Details  Name: Thomas Hanna MRN: [id number] Date of Birth: 07-Nov-1923   Medicare Important Message Given:  Yes    Shimika Ames Montine Circle 08/26/2017, 12:37 PM

## 2017-08-26 NOTE — Progress Notes (Signed)
NEUROHOSPITALISTS STROKE TEAM - DAILY PROGRESS NOTE   SUBJECTIVE (INTERVAL HISTORY) Son is at the bedside. Extubated yesterday and transferred to 3 W. Episode of AFIB with RVR and Episode of Hypoxia noted overnight. Thomas Lacy MD consulted. Patient to be transferred back to ICU today for Cardizem drip.     OBJECTIVE Lab Results: CBC:  Recent Labs  Lab 08/24/17 0344 08/25/17 0421 08/26/17 0820  WBC 16.8* 17.3* 21.7*  HGB 10.9* 10.8* 12.0*  HCT 33.1* 34.1* 37.1*  MCV 91.7 92.7 91.4  PLT 236 257 285   BMP: Recent Labs  Lab 08/21/17 1712 08/22/17 0535 08/23/17 0614 08/24/17 0344 08/25/17 0421 08/26/17 0820  NA  --  140 141 143 145 145  K  --  3.3* 3.9 3.1* 3.4* 2.3*  CL  --  118* 111 112* 113* 104  CO2  --  16* 20* 20* 22 26  GLUCOSE  --  133* 145* 134* 123* 134*  BUN  --  20 23* 20 21* 19  CREATININE  --  0.62 0.69 0.56* 0.60* 0.74  CALCIUM  --  6.4* 8.2* 8.2* 8.0* 8.1*  MG 2.4 1.9 2.3 2.1 2.2 1.9  PHOS 2.0* 1.7*  --  1.9* 2.3* 3.1   PHYSICAL EXAM Temp:  [97.6 F (36.4 C)-98.4 F (36.9 C)] 98.4 F (36.9 C) (01/15 1438) Pulse Rate:  [58-134] 79 (01/15 1438) Resp:  [24-47] 26 (01/15 1438) BP: (135-166)/(74-107) 135/92 (01/15 1438) SpO2:  [90 %-99 %] 98 % (01/15 1438) General - Well nourished, well developed, in no apparent distress, Intubated and sedated HEENT-  Normocephalic,   Cardiovascular - Iregular rate and rhythm  Respiratory -  No wheezing. Abdomen - soft and non-tender, Extremities- 1+ edema NEURO:  Mental Status: Awake, mostly non-verbal but interactive with examiner, holding hand and making hand gestures. Following only very simple commands, Right side neglect Cranial Nerves: PERRL gaze preference to the left, slight hypertropia of both eyes unable to cross the midline.  No blink to threat from right site, right lower facial weakness. Motor: 0/5 right upper extremity, minimal withdrawal to noxious  stimulus in the right lower extremity, 4/5 left upper extremity, 3+ LLE. Tone: is decreased and bulk is normal DTR 1+ and no babinski Sensation, coordination and gait not tested.  IMAGING: I have personally reviewed the radiological images below and agree with the radiology interpretations.  Ct Head Wo Contrast Result Date: 08/19/2017 IMPRESSION: Loss of gray-white differentiation within the insula and frontal operculum compatible with evolving acute infarction. No additional area of infarction identified at this time. No intracranial hemorrhage or mass effect.  CTA Head/Neck and Cerebral Perfusion W Contrast Result Date: 08/18/2017 IMPRESSION: CTA neck: 1. Patent carotid and vertebral arteries of the neck. 2. Dense calcified plaque of bilateral carotid bifurcations with proximal ICA moderate 60-70% stenosis. 3. Moderate stenosis of right vertebral artery origin. CTA head: 1. Distal left M1 occlusion extending into left M2 superior division. Early reconstitution of left M2 inferior division via collateralization. Poor downstream collateralization and left superior MCA distribution. 2. Left paraclinoid ICA severe stenosis. 3. Right paraclinoid and terminal ICA occlusion or near occlusion. Contrast opacification of the right upper cervical ICA diminishes gradually to the terminal segment, severe stenosis was present on the prior study. Findings may represent chronic severe stenosis with poor antegrade flow or interval thrombosis. CT brain perfusion: 1. Core infarct volume of 71 cc. 2. Mismatch ischemic penumbra of 102 cc. 3. Infarct location left superior MCA distribution.   Portable Chest Xray  Result Date: 08/24/2017 IMPRESSION: Worsening bilateral airspace disease which could reflect edema or Infection  Portable Chest Xray Result Date: 08/22/2017 IMPRESSION: Stable hazy bilateral airspace disease  Portable Chest Xray Result Date: 08/19/2017 IMPRESSION: Endotracheal tube 4.8 cm from carina.  Aortic atherosclerosis. No acute pulmonary process identified.   Ct Head Code Stroke Wo Contrast Result Date: 08/18/2017 IMPRESSION: 1. No acute stroke, hemorrhage, or mass effect identified. 2. Density in left M2 superior division, possible thrombus. 3. ASPECTS is 10 4. Stable chronic microvascular ischemic changes and parenchymal volume loss of the brain.   Echocardiogram:                                               Study Conclusions - Left ventricle: Posterior basal hypokinesis. Wall thickness was   increased in a pattern of mild LVH. The estimated ejection   fraction was 55%. The study is not technically sufficient to   allow evaluation of LV diastolic function. - Mitral valve: Moderately calcified annulus. Moderately thickened,   moderately calcified leaflets . There was mild regurgitation. - Left atrium: The atrium was mildly dilated. - Atrial septum: No defect or patent foramen ovale was identified.  MRI BRAIN:  1. Patchy multifocal acute ischemic infarcts involving the bilateral cerebral hemispheres, with additional 1 cm left cerebellar infarct as above. Associated petechial hemorrhage at the left frontal operculum without hemorrhagic transformation. 2. Underlying mild chronic microvascular ischemic disease.    IMPRESSION: [name] is a 84 y.o. male with PMH  of CAD, prostate cancer, left thalamic stroke with right-sided weakness in 08/2016 admitted for right-sided weakness, left gaze and aphasia.  Status post TPA.  CT head and neck showed left M1 occlusion, bilateral ICA proximal 60-70% stenosis.  Right terminal ICA occlusion or near occlusion.  Had mechanical thrombectomy with TICI2b reperfusion.  Found to have A. fib with RVR, put on Cardizem IV.  MRI showed left cerebellum, left MCA, ACA and right MCA patchy moderate sized infarcts.  Stroke consistent with embolic stroke due to newly diagnosed A. fib.  EF 55%, LDL 87 and A1c 5.7.     Acute ischemic strokes in  multiple vascular distributions status post IV TPA and mechanical thrombectomy  Suspected Etiology: likely cardio embolic given new onset AFIB   Resultant Symptoms:  right-sided weakness, left-sided gaze preference and a aphasia. Stroke Risk Factors: hyperlipidemia and hypertension, AFIB Other Stroke Risk Factors: Advanced age, Hx stroke, CAD, Prostate Cancer, Hx of MI  PROCEDURES:  08/19/2017  Dr Estanislado Pandy S/P Lt common carotid arteriogram followed by revascularization of Lt MCA distal M1 and mid perisylvian M3 branch  with mechanical thrombolysis achieving a TICI 2 b reperfusion.  08/25/2017: Patient still intubated, off sedation, still on cardizem for afib RVR, will add metoprolol of HR control. on aspirin, and IVF.  CCM on board, on weaning trials, plan to extubate this am.  Still on antibiotics for leukocytosis and aspiration.   08/26/2017 ASSESSMENT:   Patient extubated overnight. AFIB with RVR and Episode of hypoxia overnight. To be transferred back to ICU for Cardizem drip. Awake, mostly non-verbal. Following some very basic commands. Moving LUE spontaneously and occasionally LLE. No spontaneous movement of Right side. +withdrawal to pain. Family at bedside, hopeful he will start to improve.  PLAN  08/26/2017: Continue ASA, for now Maintain strict B/P parameters Frequent neuro checks Telemetry monitoring PT/OT/SLP Consult  PM & Rehab Consult Case Management /MSW Ongoing aggressive stroke risk factor management Patient will be counseled to be compliant with his antithrombotic medications Patient will be counseled on Lifestyle modifications including, Diet, Exercise, and Stress Follow up with [facility] in 6 weeks  HX OF STROKES: Left thalamic stroke with mild residual right hemiparesis since January 2018  INTRACRANIAL Atherosclerosis &Stenosis: ASA started for now May consider DAPT, if appropriate at discharge  DYSPHAGIA: NPO until passes SLP swallow  evaluation Will need to address feeding issues with family in AM Aspiration Precautions in progress Robinul in progress  MEDICAL ISSUES: CCM following, Appreciate Assistance  Inability to protect airway in the postoperative setting Terminally extubated last night Some Hypoxia overnight O2 increased, respiratory following, Proventil & Duonebs & Pulmonary Toilet in progress Family in agreement that they will not re-intubate at this time  Aspiration Pneumonia Unasyn and Vanco in progress Will stop antibiotics after 7 day course, O2 increased, respiratory following, Proventil & Duonebs & Pulmonary Toilet in progress  Acute Diastolic CHF Cardiology following IV Lasix started yesterday, +12.5L +Diuresing, Foley in place   AFIB with RVR, NEW ONSET:  A. Fib with RVR overnight, Metoprolol IV given Cardiology to have patient transferred back to ICU for Cardizem drip Cardiology Consultation, Appreciate assistance Continue to [place] - for now Will need long-term anticoagulation once safe from a stroke standpoint, if appropriate Repeat CT in 2 weeks for consideration of starting anticoagulation if CT is stable.  Leukocytosis Remains afebrile Will obtain chest CXR and U/A Will continue to monitor closely Repeat labs in AM  Hypokalemia IV Replacement in progress Repeat labs in AM  Hypocalcemia Will monitor trend for now Repeat labs in AM  Mild anemia, Likely chronic at baseline  Hgb 12.0 / Hct 37.1 - trending up Repeat labs in AM  HYPERTENSION: Stable overnight PRN Labetalol Change blood pressure goal below 110 -180.  Norepinephrine drip Discontinued Long term BP goal normotensive. Home Meds: NONE  HYPERLIPIDEMIA:    Component Value Date/Time   CHOL 140 08/19/2017 0304   TRIG 86 08/19/2017 0304   HDL 36 (L) 08/19/2017 0304   CHOLHDL 3.9 08/19/2017 0304   VLDL 17 08/19/2017 0304   LDLCALC 87 08/19/2017 0304  Home Meds:  Lovaza LDL  goal < 70 Will continued Lovaza  and start Lipitor to 10 mg daily, if passes SLP evaluation Continue statin at discharge, if appropriate  R/O DIABETES: Lab Results  Component Value Date   HGBA1C 5.7 (H) 08/19/2017  HgbA1c goal < 7.0  Other Active Problems: Principal Problem:   Acute ischemic left MCA stroke (HCC) Active Problems:   Coronary artery disease due to lipid rich plaque   Normocytic anemia   Coronary artery disease involving coronary bypass graft of native heart without angina pectoris   Pressure injury of skin   Atrial fibrillation with RVR (HCC)   Bilateral carotid artery stenosis   Aspiration pneumonia (HCC)   Ileus (HCC)   Endotracheally intubated   Hypokalemia   Hypertension   Palliative care by specialist   Goals of care, counseling/discussion   Paroxysmal atrial fibrillation ([place])   At high risk for aspiration    Hospital day # 8 VTE prophylaxis: SCD's  Diet : Diet NPO time specified , will need to address NGT with family BM: Last documented 08/24/2017, Bowel meds in progress  FAMILY UPDATES: family at bedside  TEAM UPDATES: Rosalin Hawking, MD STATUS:  DNR, Palliative following, Appreciate Assistance. Will transition to comfort care  if he declines. Watchful waiting.   Prior Home Stroke Medications:  clopidogrel 75 mg daily  Discharge Stroke Meds:  Please discharge patient on TBD   Disposition: 01-Home or Self Care Therapy Recs:               PENDING Home Equipment:         PENDING Follow Up:  Follow-up Information    Garvin Fila, MD. Schedule an appointment as soon as possible for a visit in 6 week(s).   Specialties:  Neurology, Radiology Contact information: [address] [place] [address] [place] Not In -PCP Follow up in 1-2 weeks   Case Management aware of need     Mary Sella. ANP-C Stroke Neurology Team 08/26/2017 4:13 PM  ATTENDING NOTE: I have personally examined this patient, reviewed notes, independently viewed  imaging studies, participated in medical decision making and plan of care. I have made any additions or clarifications directly to the above note.   84 year old male with history of CAD, prostate cancer, left thalamic stroke with right-sided weakness in 08/2016 admitted for right-sided weakness, left gaze and aphasia.  Status post TPA.  CT head and neck showed left M1 occlusion, bilateral ICA proximal 60-70% stenosis.  Right terminal ICA occlusion or near occlusion.  Had mechanical thrombectomy with TICI2b reperfusion.  Found to have A. fib with RVR, put on Cardizem IV.  MRI showed left cerebellum, left MCA, ACA and right MCA patchy moderate sized infarcts.  Stroke consistent with embolic stroke due to newly diagnosed A. fib.  EF 55%, LDL 87 and A1c 5.7.    Patient extubated yesterday and transferred to floor. Off IV cardizem but not passed swallow yet so metoprolol po on hold. Pt developed afib RVR and hypoxia over night. Cardiology on board and Dr. Harrington Challenger recommended continue cardizam drip for rate control, and diuresis due to fluid overload and pulmonary edema. Will transfer back to ICU. Will hold off TF today for fluid overload, will do cortrak tomorrow for nutrition if not passing swallow.  Rosalin Hawking, MD PhD Stroke Neurology 08/25/2017 2:07 PM  This patient is critically ill and at significant risk of neurological worsening, death and care requires constant monitoring of vital signs, hemodynamics,respiratory and cardiac monitoring, extensive review of multiple databases, frequent neurological assessment, discussion with family, other specialists and medical decision making of high complexity. I had long discussion with son at bedside, updated pt current condition, treatment plan and potential prognosis. Discussed with Dr. Carson Myrtle. I spent 45 minutes of neurocritical care time  in the care of  this patient.    To contact Stroke Continuity provider, please refer to [email or web address] After hours, contact  General Neurology

## 2017-08-26 NOTE — Progress Notes (Signed)
OT Cancellation Note  Patient Details Name: Thomas Hanna MRN: [id number] DOB: 01/22/1924   Cancelled Treatment:    Reason Eval/Treat Not Completed: Medical issues which prohibited therapy.  Pt Transferring to ICU.  Pt with A-Fib and RVR.  Will reattempt.  Lajoy Vanamburg [place], OTR/L [phone]   Lucille Passy M 08/26/2017, 4:44 PM

## 2017-08-27 ENCOUNTER — Ambulatory Visit (HOSPITAL_COMMUNITY): Payer: Medicare Other

## 2017-08-27 ENCOUNTER — Inpatient Hospital Stay (HOSPITAL_COMMUNITY): Payer: Medicare Other

## 2017-08-27 DIAGNOSIS — Z4659 Encounter for fitting and adjustment of other gastrointestinal appliance and device: Secondary | ICD-10-CM

## 2017-08-27 DIAGNOSIS — R609 Edema, unspecified: Secondary | ICD-10-CM

## 2017-08-27 LAB — GLUCOSE, CAPILLARY
GLUCOSE-CAPILLARY: 115 mg/dL — AB (ref 65–99)
GLUCOSE-CAPILLARY: 126 mg/dL — AB (ref 65–99)
GLUCOSE-CAPILLARY: 127 mg/dL — AB (ref 65–99)
GLUCOSE-CAPILLARY: 129 mg/dL — AB (ref 65–99)
GLUCOSE-CAPILLARY: 130 mg/dL — AB (ref 65–99)
Glucose-Capillary: 114 mg/dL — ABNORMAL HIGH (ref 65–99)
Glucose-Capillary: 118 mg/dL — ABNORMAL HIGH (ref 65–99)

## 2017-08-27 LAB — CBC WITH DIFFERENTIAL/PLATELET
BASOS PCT: 0 %
Basophils Absolute: 0 10*3/uL (ref 0.0–0.1)
Eosinophils Absolute: 0 10*3/uL (ref 0.0–0.7)
Eosinophils Relative: 0 %
HEMATOCRIT: 38.4 % — AB (ref 39.0–52.0)
HEMOGLOBIN: 12 g/dL — AB (ref 13.0–17.0)
Lymphocytes Relative: 7 %
Lymphs Abs: 1.7 10*3/uL (ref 0.7–4.0)
MCH: 28.8 pg (ref 26.0–34.0)
MCHC: 31.3 g/dL (ref 30.0–36.0)
MCV: 92.1 fL (ref 78.0–100.0)
Monocytes Absolute: 2.1 10*3/uL — ABNORMAL HIGH (ref 0.1–1.0)
Monocytes Relative: 9 %
NEUTROS ABS: 20.4 10*3/uL — AB (ref 1.7–7.7)
NEUTROS PCT: 84 %
Platelets: 263 10*3/uL (ref 150–400)
RBC: 4.17 MIL/uL — AB (ref 4.22–5.81)
RDW: 14.1 % (ref 11.5–15.5)
WBC: 24.2 10*3/uL — AB (ref 4.0–10.5)

## 2017-08-27 LAB — COMPREHENSIVE METABOLIC PANEL
ALBUMIN: 2.7 g/dL — AB (ref 3.5–5.0)
ALK PHOS: 66 U/L (ref 38–126)
ALT: 52 U/L (ref 17–63)
ANION GAP: 16 — AB (ref 5–15)
AST: 39 U/L (ref 15–41)
BILIRUBIN TOTAL: 1.8 mg/dL — AB (ref 0.3–1.2)
BUN: 19 mg/dL (ref 6–20)
CALCIUM: 8.1 mg/dL — AB (ref 8.9–10.3)
CO2: 23 mmol/L (ref 22–32)
CREATININE: 0.69 mg/dL (ref 0.61–1.24)
Chloride: 107 mmol/L (ref 101–111)
GFR calc Af Amer: >60 mL/min (ref >60)
GFR calc non Af Amer: >60 mL/min (ref >60)
Glucose, Bld: 124 mg/dL — ABNORMAL HIGH (ref 65–99)
Potassium: 3.2 mmol/L — ABNORMAL LOW (ref 3.5–5.1)
Sodium: 146 mmol/L — ABNORMAL HIGH (ref 135–145)
TOTAL PROTEIN: 5.6 g/dL — AB (ref 6.5–8.1)

## 2017-08-27 LAB — MAGNESIUM: Magnesium: 1.9 mg/dL (ref 1.7–2.4)

## 2017-08-27 LAB — URINALYSIS, ROUTINE W REFLEX MICROSCOPIC
BILIRUBIN URINE: NEGATIVE
Bacteria, UA: NONE SEEN
GLUCOSE, UA: NEGATIVE mg/dL
Ketones, ur: 5 mg/dL — AB
LEUKOCYTES UA: NEGATIVE
Nitrite: NEGATIVE
PH: 7 (ref 5.0–8.0)
Protein, ur: 30 mg/dL — AB
SPECIFIC GRAVITY, URINE: 1.02 (ref 1.005–1.030)

## 2017-08-27 LAB — PHOSPHORUS: Phosphorus: 3 mg/dL (ref 2.5–4.6)

## 2017-08-27 MED ORDER — ACETYLCYSTEINE 20 % IN SOLN
3.0000 mL | Freq: Three times a day (TID) | RESPIRATORY_TRACT | Status: DC
  Administered 2017-08-27 – 2017-08-28 (×2): 3 mL via RESPIRATORY_TRACT
  Filled 2017-08-27 (×4): qty 4

## 2017-08-27 MED ORDER — POTASSIUM CHLORIDE 10 MEQ/100ML IV SOLN
10.0000 meq | INTRAVENOUS | Status: AC
  Administered 2017-08-27 (×4): 10 meq via INTRAVENOUS
  Filled 2017-08-27 (×4): qty 100

## 2017-08-27 MED ORDER — ASPIRIN 325 MG PO TABS
325.0000 mg | ORAL_TABLET | Freq: Every day | ORAL | Status: DC
  Administered 2017-08-28: 325 mg via ORAL
  Filled 2017-08-27: qty 1

## 2017-08-27 MED ORDER — ASPIRIN 300 MG RE SUPP
300.0000 mg | Freq: Every day | RECTAL | Status: DC
  Administered 2017-08-27: 300 mg via RECTAL
  Filled 2017-08-27: qty 1

## 2017-08-27 NOTE — Progress Notes (Signed)
Modified Barium Swallow Progress Note  Patient Details  Name: Thomas Hanna MRN: [id number] Date of Birth: 1923/09/07  Today's Date: 08/27/2017  Modified Barium Swallow completed.  Full report located under Chart Review in the Imaging Section.  Brief recommendations include the following:  Clinical Impression     Swallow Evaluation Recommendations       SLP Diet Recommendations: NPO;Ice chips PRN after oral care       Medication Administration: Via alternative means               Oral Care Recommendations: Oral care QID        Breelynn Bankert, [name] 08/27/2017,2:53 PM

## 2017-08-27 NOTE — Progress Notes (Signed)
Daily Progress Note   Patient Name: Thomas Hanna       Date: 08/27/2017 DOB: 1923/09/08  Age: 84 y.o. MRN#: [id number] Attending Physician: Rosalin Hawking, MD Primary Care Physician: System, Pcp Not In Admit Date: 08/18/2017  Reason for Consultation/Follow-up: Establishing goals of care  Subjective: Patient awake, alert, follows commands with LUE, and will nod yes/no to simple questions. Denies pain or discomfort.  GOC:  [name] and Leafy Ro (son and DIL) at bedside. They are glad he is being better monitored in ICU. They are glad his heart rate has improved. They remain hopeful that he will pass speech swallow evaluation. Going for modified barium at 1:30pm per nurse. Explained concern for continued aspiration risk and audible secretions.   I asked [name] if he has questions or concerns. He asks for a "miracle." Emotional and spiritual support provided.   Length of Stay: 9  Current Medications: Scheduled Meds:  . acetylcysteine  3 mL Nebulization TID  . aspirin  300 mg Rectal Daily   Or  . aspirin  325 mg Oral Daily  . chlorhexidine gluconate (MEDLINE KIT)  15 mL Mouth Rinse BID  . dorzolamide-timolol  1 drop Both Eyes BID  . furosemide  40 mg Intravenous Q12H  . glycopyrrolate  0.4 mg Intravenous Q4H  . ipratropium-albuterol  3 mL Nebulization Q6H  . latanoprost  1 drop Both Eyes QHS  . mouth rinse  15 mL Mouth Rinse BID  . pantoprazole sodium  40 mg Per Tube Daily    Continuous Infusions: . ampicillin-sulbactam (UNASYN) IV Stopped (08/27/17 0600)  . diltiazem (CARDIZEM) infusion 12.5 mg/hr (08/27/17 1100)    PRN Meds: acetaminophen **OR** acetaminophen (TYLENOL) oral liquid 160 mg/5 mL **OR** acetaminophen, albuterol, bisacodyl, labetalol, metoprolol tartrate, metoprolol  tartrate, ondansetron, phenol, senna-docusate  Physical Exam  Constitutional: He is cooperative. He appears ill.  HENT:  Head: Normocephalic and atraumatic.  Cardiovascular: An irregularly irregular rhythm present.  Afib  Pulmonary/Chest: No accessory muscle usage. No tachypnea. No respiratory distress. He has rhonchi.  Audible secretions. On 6L  Abdominal: Normal appearance. There is no tenderness.  Neurological: He is alert.  Alert, follows commands on left side. Nods head yes/no to simple questions. Attempting to speak during my visit.   Skin: Skin is warm and dry. There is pallor.  Psychiatric: He is noncommunicative.  Nursing note and vitals reviewed.          Vital Signs: BP 130/83   Pulse 89   Temp 98.5 F (36.9 C) (Axillary)   Resp (!) 27   Ht 6' 2.02" (1.88 m)   Wt 80.8 kg (178 lb 2.1 oz)   SpO2 98%   BMI 22.86 kg/m  SpO2: SpO2: 98 % O2 Device: O2 Device: High Flow Nasal Cannula O2 Flow Rate: O2 Flow Rate (L/min): 8 L/min  Intake/output summary:   Intake/Output Summary (Last 24 hours) at 08/27/2017 1335 Last data filed at 08/27/2017 1100 Gross per 24 hour  Intake 1394.7 ml  Output 4760 ml  Net -3365.3 ml   LBM: Last BM Date: 08/24/17 Baseline Weight: Weight: 82.9 kg (182 lb 12.2 oz) Most recent weight: Weight: 80.8 kg (178 lb 2.1 oz)       Palliative Assessment/Data:  PPS 30%   Flowsheet Rows     Most Recent Value  Intake Tab  Referral Department  Critical care  Unit at Time of Referral  ICU  Palliative Care Primary Diagnosis  Neurology  Date Notified  08/24/17  Palliative Care Type  New Palliative care  Reason for referral  Clarify Goals of Care  Date of Admission  08/18/17  Date first seen by Palliative Care  08/24/17  # of days Palliative referral response time  0 Day(s)  # of days IP prior to Palliative referral  6  Clinical Assessment  Palliative Performance Scale Score  30%  Pain Max last 24 hours  Not able to report  Pain Min Last 24  hours  Not able to report  Dyspnea Max Last 24 Hours  Not able to report  Dyspnea Min Last 24 hours  Not able to report  Nausea Max Last 24 Hours  Not able to report  Nausea Min Last 24 Hours  Not able to report  Anxiety Max Last 24 Hours  Not able to report  Anxiety Min Last 24 Hours  Not able to report  Other Max Last 24 Hours  Not able to report  Psychosocial & Spiritual Assessment  Palliative Care Outcomes  Patient/Family meeting held?  Yes  Who was at the meeting?  son and DIL  Patient/Family wishes: Interventions discontinued/not started   Lurline Idol, [facility]  Palliative Care follow-up planned  Yes, Facility      Patient Active Problem List   Diagnosis Date Noted  . At high risk for aspiration   . SOB (shortness of breath)   . Right hemiplegia ([place])   . Aphasia   . Goals of care, counseling/discussion   . Paroxysmal atrial fibrillation (HCC)   . Palliative care by specialist   . Hypokalemia 08/22/2017  . Hypertension 08/22/2017  . Aspiration pneumonia ([place]) 08/21/2017  . Ileus ([place]) 08/21/2017  . Endotracheally intubated   . Bilateral carotid artery stenosis 08/20/2017    Class: Chronic  . Pressure injury of skin 08/19/2017  . Atrial fibrillation with RVR ([place])   . Acute ischemic left MCA stroke ([place]) 08/18/2017  . Cervical paraspinal muscle spasm 02/18/2017  . MCI (mild cognitive impairment) 10/22/2016  . Orthostatic hypertension 09/13/2016  . Coronary artery disease involving coronary bypass graft of native heart without angina pectoris 09/13/2016  . Abnormal MRI of head 09/13/2016  . Orthostatic hypotension 09/03/2016  . CAD S/P percutaneous coronary angioplasty 09/03/2016  . Hx of CABG 09/03/2016  . Dyslipidemia 09/03/2016  . Coronary artery disease due to lipid rich plaque 08/29/2016  . Hyponatremia  08/29/2016  . Elevated LFTs 08/29/2016  . Normocytic anemia 08/29/2016  . Syncope 08/28/2016    Palliative Care Assessment & Plan   Patient Profile: 84 y.o. male   with past medical history of left thalamic stroke (January 2018), atrial fib, hypertension, hyperlipidemia admitted on 08/18/2017 with right-sided weakness, left-sided gaze preference, aphasia.  Last known well at 1900 on 08/18/2017.  He presented to the emergency department as a code stroke and was given IV TPA on 08/18/2016.  Additionally, he underwent thrombo-lysis on 08/19/2017.  He has been intubated and is in ICU. Extubated 1/14. Consult ordered for goals of care.  Assessment: Acute CVA-left MCA occlusion Acute respiratory failure Afib RVR Bilateral carotid artery stenosis Aspiration pneumonia CAD  Recommendations/Plan:  DNR. No re-intubation if respiratory status decompensates.   Transferred back to ICU per Dr. Erlinda Hong for diuresis and cardizem gtt. Family remains hopeful with improvement in cognitive status. They want to "give him a chance."   Ongoing SLP/PT/OT evaluations. High risk for aspiration. MBS today at 1:30pm.   PMT will continue to support patient/family through hospitalization. Will further discuss concern with feeding tomorrow.   Code Status:  DNR   Code Status Orders  (From admission, onward)        Start     Ordered   08/20/17 1411  Do not attempt resuscitation (DNR)  Continuous    Question Answer Comment  In the event of cardiac or respiratory ARREST Do not call a "code blue"   In the event of cardiac or respiratory ARREST Do not perform Intubation, CPR, defibrillation or ACLS   In the event of cardiac or respiratory ARREST Use medication by any route, position, wound care, and other measures to relive pain and suffering. May use oxygen, suction and manual treatment of airway obstruction as needed for comfort.      08/20/17 1410    Code Status History    Date Active Date Inactive Code Status Order ID Comments User Context   08/19/2017 02:18 08/20/2017 14:10 Full Code [id number]  Luanne Bras, MD Inpatient   08/18/2017 22:37 08/19/2017 02:18 Full Code [id number]  Amie Portland, MD ED   09/03/2016 16:05 09/09/2016 19:20 Full Code [id number]  Erlene Quan, PA-C ED   08/29/2016 00:39 08/29/2016 16:48 Full Code [id number]  Edwin Dada, MD Inpatient       Prognosis:   Unable to determine: guarded with acute ischemic CVA, acute respiratory failure, and high aspiration risk.   Discharge Planning:  To Be Determined  Care plan was discussed with  Son and DIL at bedside  Thank you for allowing the Palliative Medicine Team to assist in the care of this patient.   Time In: 1110 Time Out: 1125 Total Time 92mn Prolonged Time Billed  no      Greater than 50%  of this time was spent counseling and coordinating care related to the above assessment and plan.  MIhor Dow FNP-C Palliative Medicine Team  Phone: [phone]: [phone] Please contact Palliative Medicine Team phone at [phone] questions and concerns.

## 2017-08-27 NOTE — Progress Notes (Signed)
PT Cancellation Note  Patient Details Name: Thomas Hanna MRN: [id number] DOB: [birth year]   Cancelled Treatment:    Reason Eval/Treat Not Completed: Medical issues which prohibited therapy. Pt now in ICU and on cardizem drip. Pt remains to have irregular heart rhythm. RN and MD asked to hold today. PT to return as able, as appropriate.   Ramsay Bognar M Latria Mccarron 08/27/2017, 10:10 AM   Kittie Plater, PT, DPT Pager #: [phone] Office #: [phone]

## 2017-08-27 NOTE — Progress Notes (Signed)
NEUROHOSPITALISTS STROKE TEAM - DAILY PROGRESS NOTE   SUBJECTIVE (INTERVAL HISTORY) No family is at the bedside. Transfer back to neuro ICU yesterday for afib RVR and respiratory distress. Pt on cardizem IV now and HR better controlled. Pending speech to see if pt can take po. Still has copious secretions, CCM on board, will put on mucomyst neb. WBC elevated and still on unasyn. Will check UA and rule out DVT.      OBJECTIVE Lab Results: CBC:  Recent Labs  Lab 08/25/17 0421 08/26/17 0820 08/27/17 0306  WBC 17.3* 21.7* 24.2*  HGB 10.8* 12.0* 12.0*  HCT 34.1* 37.1* 38.4*  MCV 92.7 91.4 92.1  PLT 257 285 263   BMP: Recent Labs  Lab 08/22/17 0535  08/24/17 0344 08/25/17 0421 08/26/17 0820 08/26/17 2257 08/27/17 0306  NA 140   < > 143 145 145 146* 146*  K 3.3*   < > 3.1* 3.4* 2.3* 2.7* 3.2*  CL 118*   < > 112* 113* 104 104 107  CO2 16*   < > 20* 22 26 25 23   GLUCOSE 133*   < > 134* 123* 134* 115* 124*  BUN 20   < > 20 21* 19 19 19   CREATININE 0.62   < > 0.56* 0.60* 0.74 0.81 0.69  CALCIUM 6.4*   < > 8.2* 8.0* 8.1* 8.2* 8.1*  MG 1.9   < > 2.1 2.2 1.9 1.9 1.9  PHOS 1.7*  --  1.9* 2.3* 3.1  --  3.0   < > = values in this interval not displayed.   PHYSICAL EXAM Temp:  [97.4 F (36.3 C)-98.7 F (37.1 C)] 98.5 F (36.9 C) (01/16 1200) Pulse Rate:  [39-135] 89 (01/16 1100) Resp:  [24-40] 27 (01/16 1100) BP: (117-171)/(70-116) 130/83 (01/16 1100) SpO2:  [89 %-100 %] 98 % (01/16 1100) Weight:  [178 lb 2.1 oz (80.8 kg)-188 lb 7.9 oz (85.5 kg)] 178 lb 2.1 oz (80.8 kg) (01/16 0500) General - Well nourished, well developed, in no apparent distress, Intubated and sedated HEENT-  Normocephalic,   Cardiovascular - Iregular rate and rhythm  Respiratory -  No wheezing. Abdomen - soft and non-tender, Extremities- 1+ edema NEURO:  Mental Status: Awake, mostly non-verbal but interactive with examiner, holding hand and making hand  gestures. Following only very simple commands, Right side neglect Cranial Nerves: PERRL gaze preference to the left, slight hypertropia of both eyes unable to cross the midline.  No blink to threat from right site, right lower facial weakness. Motor: 0/5 right upper extremity, minimal withdrawal to noxious stimulus in the right lower extremity, 4/5 left upper extremity, 3+ LLE. Tone: is decreased and bulk is normal DTR 1+ and no babinski Sensation, coordination and gait not tested.  IMAGING: I have personally reviewed the radiological images below and agree with the radiology interpretations.  Ct Head Wo Contrast Result Date: 08/19/2017 IMPRESSION: Loss of gray-white differentiation within the insula and frontal operculum compatible with evolving acute infarction. No additional area of infarction identified at this time. No intracranial hemorrhage or mass effect.  CTA Head/Neck and Cerebral Perfusion W Contrast Result Date: 08/18/2017 IMPRESSION: CTA neck: 1. Patent carotid and vertebral arteries of the neck. 2. Dense calcified plaque of bilateral carotid bifurcations with proximal ICA moderate 60-70% stenosis. 3. Moderate stenosis of right vertebral artery origin. CTA head: 1. Distal left M1 occlusion extending into left M2 superior division. Early reconstitution of left M2 inferior division via collateralization. Poor downstream collateralization and left superior MCA  distribution. 2. Left paraclinoid ICA severe stenosis. 3. Right paraclinoid and terminal ICA occlusion or near occlusion. Contrast opacification of the right upper cervical ICA diminishes gradually to the terminal segment, severe stenosis was present on the prior study. Findings may represent chronic severe stenosis with poor antegrade flow or interval thrombosis. CT brain perfusion: 1. Core infarct volume of 71 cc. 2. Mismatch ischemic penumbra of 102 cc. 3. Infarct location left superior MCA distribution.   Portable Chest Xray Result  Date: 08/24/2017 IMPRESSION: Worsening bilateral airspace disease which could reflect edema or Infection  Portable Chest Xray Result Date: 08/22/2017 IMPRESSION: Stable hazy bilateral airspace disease  Portable Chest Xray Result Date: 08/19/2017 IMPRESSION: Endotracheal tube 4.8 cm from carina. Aortic atherosclerosis. No acute pulmonary process identified.   Ct Head Code Stroke Wo Contrast Result Date: 08/18/2017 IMPRESSION: 1. No acute stroke, hemorrhage, or mass effect identified. 2. Density in left M2 superior division, possible thrombus. 3. ASPECTS is 10 4. Stable chronic microvascular ischemic changes and parenchymal volume loss of the brain.   Echocardiogram:                                               Study Conclusions - Left ventricle: Posterior basal hypokinesis. Wall thickness was   increased in a pattern of mild LVH. The estimated ejection   fraction was 55%. The study is not technically sufficient to   allow evaluation of LV diastolic function. - Mitral valve: Moderately calcified annulus. Moderately thickened,   moderately calcified leaflets . There was mild regurgitation. - Left atrium: The atrium was mildly dilated. - Atrial septum: No defect or patent foramen ovale was identified.  MRI BRAIN:  1. Patchy multifocal acute ischemic infarcts involving the bilateral cerebral hemispheres, with additional 1 cm left cerebellar infarct as above. Associated petechial hemorrhage at the left frontal operculum without hemorrhagic transformation. 2. Underlying mild chronic microvascular ischemic disease.  LE venous doppler pending      IMPRESSION: [name] is a 84 y.o. male with PMH  of CAD, prostate cancer, left thalamic stroke with right-sided weakness in 08/2016 admitted for right-sided weakness, left gaze and aphasia.  Status post TPA.  CT head and neck showed left M1 occlusion, bilateral ICA proximal 60-70% stenosis.  Right terminal ICA occlusion or near  occlusion.  Had mechanical thrombectomy with TICI2b reperfusion.  Found to have A. fib with RVR, put on Cardizem IV.  MRI showed left cerebellum, left MCA, ACA and right MCA patchy moderate sized infarcts.  Stroke consistent with embolic stroke due to newly diagnosed A. fib.  EF 55%, LDL 87 and A1c 5.7.     Acute ischemic strokes in multiple vascular distributions status post IV TPA and mechanical thrombectomy  Suspected Etiology: likely cardio embolic given new onset AFIB   Resultant Symptoms:  right-sided weakness, left-sided gaze preference and a aphasia. Stroke Risk Factors: hyperlipidemia and hypertension, AFIB Other Stroke Risk Factors: Advanced age, Hx stroke, CAD, Prostate Cancer, Hx of MI  PROCEDURES:  08/19/2017  Dr Estanislado Pandy S/P Lt common carotid arteriogram followed by revascularization of Lt MCA distal M1 and mid perisylvian M3 branch  with mechanical thrombolysis achieving a TICI 2 b reperfusion.  08/25/2017: Patient still intubated, off sedation, still on cardizem for afib RVR, will add metoprolol of HR control. on aspirin, and IVF.  CCM on board, on weaning trials, plan to  extubate this am.  Still on antibiotics for leukocytosis and aspiration.   08/27/2017 ASSESSMENT:   Patient extubated overnight. AFIB with RVR and Episode of hypoxia overnight. To be transferred back to ICU for Cardizem drip. Awake, mostly non-verbal. Following some very basic commands. Moving LUE spontaneously and occasionally LLE. No spontaneous movement of Right side. +withdrawal to pain. Family at bedside, hopeful he will start to improve.  PLAN  08/27/2017: Continue ASA, for now Maintain strict B/P parameters Frequent neuro checks Telemetry monitoring PT/OT/SLP Consult PM & Rehab Consult Case Management /MSW Ongoing aggressive stroke risk factor management Patient will be counseled to be compliant with his antithrombotic medications Patient will be counseled on Lifestyle modifications including, Diet,  Exercise, and Stress Follow up with [facility] in 6 weeks  HX OF STROKES: Left thalamic stroke with mild residual right hemiparesis since January 2018  INTRACRANIAL Atherosclerosis &Stenosis: ASA started for now May consider DAPT, if appropriate at discharge  DYSPHAGIA: NPO until passes SLP swallow evaluation Will need to address feeding issues with family in AM Aspiration Precautions in progress [place]ul in progress  MEDICAL ISSUES: CCM following, Appreciate Assistance  Inability to protect airway in the postoperative setting Terminally extubated last night Some Hypoxia overnight O2 increased, respiratory following, Proventil & Duonebs & Pulmonary Toilet in progress Family in agreement that they will not re-intubate at this time  Aspiration Pneumonia Unasyn and Vanco in progress Will stop antibiotics after 7 day course, O2 increased, respiratory following, Proventil & Duonebs & Pulmonary Toilet in progress  Acute Diastolic CHF Cardiology following IV Lasix started yesterday, +12.5L +Diuresing, Foley in place   AFIB with RVR, NEW ONSET:  A. Fib with RVR overnight, Metoprolol IV given Cardiology to have patient transferred back to ICU for Cardizem drip Cardiology Consultation, Appreciate assistance Continue to HOLD [facility] - for now Will need long-term anticoagulation once safe from a stroke standpoint, if appropriate Repeat CT in 2 weeks for consideration of starting anticoagulation if CT is stable.  Leukocytosis Remains afebrile Will obtain chest CXR and U/A Will continue to monitor closely Repeat labs in AM  Hypokalemia IV Replacement in progress Repeat labs in AM  Hypocalcemia Will monitor trend for now Repeat labs in AM  Mild anemia, Likely chronic at baseline  Hgb 12.0 / Hct 37.1 - trending up Repeat labs in AM  HYPERTENSION: Stable overnight PRN Labetalol Change blood pressure goal below 110 -180.  Norepinephrine drip  Discontinued Long term BP goal normotensive. Home Meds: NONE  HYPERLIPIDEMIA:    Component Value Date/Time   CHOL 140 08/19/2017 0304   TRIG 86 08/19/2017 0304   HDL 36 (L) 08/19/2017 0304   CHOLHDL 3.9 08/19/2017 0304   VLDL 17 08/19/2017 0304   LDLCALC 87 08/19/2017 0304  Home Meds:  Lovaza LDL  goal < 70 Will continued Lovaza and start Lipitor to 10 mg daily, if passes SLP evaluation Continue statin at discharge, if appropriate  R/O DIABETES: Lab Results  Component Value Date   HGBA1C 5.7 (H) 08/19/2017  HgbA1c goal < 7.0  Other Active Problems: Principal Problem:   Acute ischemic left MCA stroke (HCC) Active Problems:   Coronary artery disease due to lipid rich plaque   Normocytic anemia   Coronary artery disease involving coronary bypass graft of native heart without angina pectoris   Pressure injury of skin   Atrial fibrillation with RVR ([place])   Bilateral carotid artery stenosis   Aspiration pneumonia ([place])   Ileus ([place])   Endotracheally intubated  Hypokalemia   Hypertension   Palliative care by specialist   Goals of care, counseling/discussion   Paroxysmal atrial fibrillation (HCC)   At high risk for aspiration   SOB (shortness of breath)   Right hemiplegia ([place])   [facility] day # 9 VTE prophylaxis: SCD's  Diet : Diet NPO time specified , will need to address NGT with family BM: Last documented 08/24/2017, Bowel meds in progress  FAMILY UPDATES: family at bedside  TEAM UPDATES: Rosalin Hawking, MD STATUS:  DNR, Palliative following, Appreciate Assistance. Will transition to comfort care if he declines. Watchful waiting.   Prior Home Stroke Medications:  clopidogrel 75 mg daily  Discharge Stroke Meds:  Please discharge patient on TBD   Disposition: 01-Home or Self Care Therapy Recs:               PENDING Home Equipment:         PENDING Follow Up:  Follow-up Information    Garvin Fila, MD. Schedule an appointment as soon as possible for  a visit in 6 week(s).   Specialties:  Neurology, Radiology Contact information: [address] [place] [address] [place] Not In -PCP Follow up in 1-2 weeks   Case Management aware of need     Mary Sella. ANP-C Stroke Neurology Team 08/27/2017 1:19 PM  ATTENDING NOTE: I have personally examined this patient, reviewed notes, independently viewed imaging studies, participated in medical decision making and plan of care. I have made any additions or clarifications directly to the above note.   84 year old male with history of CAD, prostate cancer, left thalamic stroke with right-sided weakness in 08/2016 admitted for right-sided weakness, left gaze and aphasia.  Status post TPA.  CT head and neck showed left M1 occlusion, bilateral ICA proximal 60-70% stenosis.  Right terminal ICA occlusion or near occlusion.  Had mechanical thrombectomy with TICI2b reperfusion.  Found to have A. fib with RVR, put on Cardizem IV.  MRI showed left cerebellum, left MCA, ACA and right MCA patchy moderate sized infarcts.  Stroke consistent with embolic stroke due to newly diagnosed A. fib.  EF 55%, LDL 87 and A1c 5.7.    Patient extubated on 08/25/17 and transferred to floor, but developed afib RVR and hypoxia over night. Transfer back to ICU, and restarted cardizem IV. Pending speech today and if not passing swallow, will do cortrak  for nutrition and medication. WBC still elevated, will check UA and rule out DVT. CCM on board.  Rosalin Hawking, MD PhD Stroke Neurology 08/25/2017 2:07 PM  This patient is critically ill and at significant risk of neurological worsening, death and care requires constant monitoring of vital signs, hemodynamics,respiratory and cardiac monitoring, extensive review of multiple databases, frequent neurological assessment, discussion with family, other specialists and medical decision making of high complexity. I had long discussion with son  at bedside, updated pt current condition, treatment plan and potential prognosis. Discussed with Dr. Carson Myrtle. I spent 40 minutes of neurocritical care time  in the care of  this patient.    To contact Stroke Continuity provider, please refer to [email or web address] After hours, contact General Neurology

## 2017-08-27 NOTE — Progress Notes (Signed)
Bilateral lower extremity venous duplex has been completed. Negative for obvious evidence of DVT.  08/27/17 3:35 PM Thomas Hanna RVT

## 2017-08-27 NOTE — Evaluation (Signed)
Clinical/Bedside Swallow Evaluation Patient Details  Name: Thomas Hanna MRN: [id number] Date of Birth: 06-26-1924  Today's Date: 08/27/2017 Time: SLP Start Time (ACUTE ONLY): 1035 SLP Stop Time (ACUTE ONLY): 1100 SLP Time Calculation (min) (ACUTE ONLY): 25 min  Past Medical History:  Past Medical History:  Diagnosis Date  . Coronary artery disease   . MI (myocardial infarction) ([place])   . Orthostatic hypotension 08/2016  . Prostate cancer ([place])   . Stroke [facility])    Past Surgical History:  Past Surgical History:  Procedure Laterality Date  . cardiac stents    . CARDIAC SURGERY    . CORONARY ARTERY BYPASS GRAFT    . IR ANGIO EXTRACRAN SEL COM CAROTID INNOMINATE UNI R MOD SED  08/19/2017  . IR ANGIO VERTEBRAL SEL SUBCLAVIAN INNOMINATE UNI R MOD SED  08/19/2017  . IR PERCUTANEOUS ART THROMBECTOMY/INFUSION INTRACRANIAL INC DIAG ANGIO  08/19/2017  . RADIOLOGY WITH ANESTHESIA N/A 08/18/2017   Procedure: RADIOLOGY WITH ANESTHESIA;  Surgeon: Luanne Bras, MD;  Location: [facility];  Service: Radiology;  Laterality: N/A;   HPI:  84 y.o.malewith past medical history of left thalamic stroke (January 2018), atrial fib, hypertension, hyperlipidemiaadmitted on1/7/2019with right-sided weakness, left-sided gaze preference, aphasia. He presented to the emergency department as a code stroke and was given IV TPA on 08/18/2016. Additionally, he underwent thrombo-lysis on 08/19/2017. He has been intubated and is in ICU.Palliative care consulted on 1/14 with resulting one way extubation. Pt has struggled with management of secretions overnight with episodes of hypoxia. Initial CXR clear, most recent shows Worsening bilateral airspace disease which could reflect edema or infection. No prior SLP evaluations documented.    Assessment / Plan / Recommendation Clinical Impression  Pt demonstrates severe language impairment, likely global aphasia with no accuracy in following one step verbal only commands or  responding accuretly to Y/N. Pt can occasionally follow gestures for basic functional tasks (use of spoon, washing face) when attentive. Pt sustains attention for 30 seconds maximally then closes eyes, left gaze preference noted. Could not elicit speech throughout exam. Did place hearing aid though no improvement in comprehension noted. Briefly initiated aphasia education with family. Recommend SLP follow acutely to facilitate langauge with SNF level f/u.  SLP Visit Diagnosis: Frontal lobe and executive function deficit Frontal lobe and executive function deficit following: Cerebral infarction    Aspiration Risk       Diet Recommendation          Other  Recommendations Oral Care Recommendations: Oral care QID   Follow up Recommendations Skilled Nursing facility      Frequency and Duration min 2x/week          Prognosis        Swallow Study   General HPI: 84 y.o.malewith past medical history of left thalamic stroke (January 2018), atrial fib, hypertension, hyperlipidemiaadmitted on1/7/2019with right-sided weakness, left-sided gaze preference, aphasia. He presented to the emergency department as a code stroke and was given IV TPA on 08/18/2016. Additionally, he underwent thrombo-lysis on 08/19/2017. He has been intubated and is in ICU.Palliative care consulted on 1/14 with resulting one way extubation. Pt has struggled with management of secretions overnight with episodes of hypoxia. Initial CXR clear, most recent shows Worsening bilateral airspace disease which could reflect edema or infection. No prior SLP evaluations documented.  Temperature Spikes Noted: No Oral Cavity - Dentition: Adequate natural dentition    Oral/Motor/Sensory Function Overall Oral Motor/Sensory Function: Within functional limits   Ice Chips     Thin Liquid  Nectar Thick     Honey Thick     Puree     Solid   GO           Thomas Baltimore, MA CCC-SLP [phone]  Thomas Hanna 08/27/2017,2:06 PM

## 2017-08-27 NOTE — Progress Notes (Signed)
OT Cancellation Note  Patient Details Name: Prateek Knipple MRN: [id number] DOB: 01/23/1924   Cancelled Treatment:    Reason Eval/Treat Not Completed: Medical issues which prohibited therapy. Pt now in ICU and on cardizem drip. Pt remains to have irregular heart rhythm. RN and MD asked to hold today.   Binnie Kand M.S., OTR/L Pager: [phone]  08/27/2017, 11:02 AM

## 2017-08-27 NOTE — Progress Notes (Signed)
  Speech Language Pathology Treatment: Dysphagia  Patient Details Name: Thomas Hanna MRN: [id number] DOB: [birth year] Today's Date: 08/27/2017 Time: 1040-1100 SLP Time Calculation (min) (ACUTE ONLY): 20 min  Assessment / Plan / Recommendation Clinical Impression  Pt demonstrates improved oral hygiene and improved management of pharyngeal secretions. He is awake and alert but requires max cueing and assist for intake. Trialed ice, thin, nectar and puree textures with intermittent oral holding, multiple swallows with suspected decreased laryngeal elevation and immediate and late coughing. Suspect penetration and aspiration events as well as pharyngeal residuals. Will proceed with objective assessment to potentially determine best diet texture.   HPI HPI: 84 y.o.malewith past medical history of left thalamic stroke (January 2018), atrial fib, hypertension, hyperlipidemiaadmitted on1/7/2019with right-sided weakness, left-sided gaze preference, aphasia. He presented to the emergency department as a code stroke and was given IV TPA on 08/18/2016. Additionally, he underwent thrombo-lysis on 08/19/2017. He has been intubated and is in ICU.Palliative care consulted on 1/14 with resulting one way extubation. Pt has struggled with management of secretions overnight with episodes of hypoxia. Initial CXR clear, most recent shows Worsening bilateral airspace disease which could reflect edema or infection. No prior SLP evaluations documented.       SLP Plan  MBS       Recommendations  Diet recommendations: NPO                Oral Care Recommendations: Oral care QID Follow up Recommendations: Skilled Nursing facility SLP Visit Diagnosis: Dysphagia, oropharyngeal phase (R13.12) Plan: MBS       GO                Yaritzel Stange, [name] 08/27/2017, 1:54 PM

## 2017-08-27 NOTE — Progress Notes (Signed)
Progress Note  Patient Name: Thomas Hanna Date of Encounter: 08/27/2017  Primary Cardiologist: Shelva Majestic, MD   Subjective  Awake  Nods some     Inpatient Medications    Scheduled Meds: . aspirin  325 mg Oral Daily  . chlorhexidine gluconate (MEDLINE KIT)  15 mL Mouth Rinse BID  . dorzolamide-timolol  1 drop Both Eyes BID  . furosemide  40 mg Intravenous Q12H  . glycopyrrolate  0.4 mg Intravenous Q4H  . ipratropium-albuterol  3 mL Nebulization Q6H  . latanoprost  1 drop Both Eyes QHS  . mouth rinse  15 mL Mouth Rinse BID  . metoprolol tartrate  25 mg Oral BID  . pantoprazole sodium  40 mg Per Tube Daily   Continuous Infusions: . ampicillin-sulbactam (UNASYN) IV Stopped (08/27/17 0600)  . diltiazem (CARDIZEM) infusion 12.5 mg/hr (08/27/17 1000)   PRN Meds: acetaminophen **OR** acetaminophen (TYLENOL) oral liquid 160 mg/5 mL **OR** acetaminophen, albuterol, bisacodyl, labetalol, metoprolol tartrate, metoprolol tartrate, ondansetron, phenol, senna-docusate   Vital Signs    Vitals:   08/27/17 0800 08/27/17 0830 08/27/17 0900 08/27/17 1000  BP: (!) 145/79  130/71 136/79  Pulse: (!) 122  (!) 132 71  Resp: (!) 33  (!) 25 (!) 30  Temp: (!) 97.4 F (36.3 C)     TempSrc: Axillary     SpO2: 97% 97% 93% 100%  Weight:      Height:        Intake/Output Summary (Last 24 hours) at 08/27/2017 1057 Last data filed at 08/27/2017 1000 Gross per 24 hour  Intake 1382.2 ml  Output 4685 ml  Net -3302.8 ml   Filed Weights   08/25/17 0451 08/26/17 1742 08/27/17 0500  Weight: 202 lb 2.6 oz (91.7 kg) 188 lb 7.9 oz (85.5 kg) 178 lb 2.1 oz (80.8 kg)    Telemetry    Atrial fib  80s to 100s Personally Reviewed  ECG     Physical Exam   GEN: Pt sitting uprigh Cardiac:  Irreg irreg  , no murmurs, rubs, or gallops.  Respiratory: Rhonchi GI: Soft, nontender, non-distended    MS: Tr to 1+  edema; No deformity. Neuro:  Deferred  Psych  Awake  Labs    Chemistry Recent Labs   Lab 08/26/17 0820 08/26/17 2257 08/27/17 0306  NA 145 146* 146*  K 2.3* 2.7* 3.2*  CL 104 104 107  CO2 _0 GLUCOSE 134* 115* 124*  BUN _1 CREATININE 0.74 0.81 0.69  CALCIUM 8.1* 8.2* 8.1*  PROT  --   --  5.6*  ALBUMIN  --   --  2.7*  AST  --   --  39  ALT  --   --  52  ALKPHOS  --   --  66  BILITOT  --   --  1.8*  GFRNONAA >60 >60 >60  GFRAA >60 >60 >60  ANIONGAP 15 17* 16*     Hematology Recent Labs  Lab 08/25/17 0421 08/26/17 0820 08/27/17 0306  WBC 17.3* 21.7* 24.2*  RBC 3.68* 4.06* 4.17*  HGB 10.8* 12.0* 12.0*  HCT 34.1* 37.1* 38.4*  MCV 92.7 91.4 92.1  MCH 29.3 29.6 28.8  MCHC 31.7 32.3 31.3  RDW 14.0 14.0 14.1  PLT 257 285 263    Cardiac EnzymesNo results for input(s): TROPONINI in the last 168 hours.  No results for input(s): TROPIPOC in the last 168 hours.   BNPNo results for input(s): BNP, PROBNP in the last  168 hours.   DDimer No results for input(s): DDIMER in the last 168 hours.   Radiology    Dg Chest Port 1 View  Result Date: 08/26/2017 CLINICAL DATA:  Dyspnea EXAM: PORTABLE CHEST 1 VIEW COMPARISON:  08/24/2017 chest radiograph. FINDINGS: Intact sternotomy wires. CABG clips overlie the mediastinum. Stable cardiomediastinal silhouette with top normal heart size and aortic atherosclerosis. No pneumothorax. Possible trace bilateral pleural effusions. Hazy opacities in left parahilar and bibasilar lungs, not appreciably changed. IMPRESSION: No appreciable change in hazy left parahilar and bibasilar lung opacities, which are nonspecific and could reflect asymmetric mild pulmonary edema, aspiration or pneumonia. Electronically Signed   By: Ilona Sorrel M.D.   On: 08/26/2017 23:47    Cardiac Studies     Patient Profile       Assessment & Plan    1  Atrial fib  Rates are better  I would keep on IV dilt until taking PO Well  Swallowing eval to be done  2  Acute diastolic CHF  LVEF 25%  Mild MR   Pt is clinically much improved  from 2  Days ago and yesterday  Diuresing  I would continue IV lasix for now prob through today as bid  Still with increased volume on exam  Replete KCL    3  CAD  Again, Iam not convinced of active angina  4  Neuro Deferred   5  Increased WBC  Still up  UA pending  Plan to pull foley    REcomm tx to unit that can provide above level of care . For questions or updates, please contact [place] Please consult [email or web address] for contact info under Cardiology/STEMI.      Signed, Dorris Carnes, MD  08/27/2017, 10:57 AM

## 2017-08-27 NOTE — Progress Notes (Signed)
PULMONARY / CRITICAL CARE MEDICINE   Name: Shriyan Arakawa MRN: [id number] DOB: [birth year]    ADMISSION DATE:  08/18/2017 CONSULTATION DATE: 08/19/2017  REFERRING MD: Dr. Leonie Man  CHIEF COMPLAINT: Stroke   SUBJECTIVE:  -Interim events: patient transferred back to ICU(4N) for diltiazem gtt. Remains extubated after "one-way" extubation. No respiratory distress. Awake, alert, but global aphasia. Social smile and seems to randomly nod his head and answer yes/no to questions. Needs hearing aides. Speech evaluation under way. On diltiazem gtt for rate control, as he is currently unable to take PO medications.   HISTORY OF PRESENT ILLNESS:   This 84 year old male is seen in consultation at the request of Dr. Leonie Man previous left thalamic stroke with mild residual right hemiparesis(January 2018) presented to [facility] emergency department on 1/7 with complaints of right-sided weakness, left-sided gaze preference, and aphasia.  Last known well at 1900 on 1/7.  He presented to the emergency department as a code stroke and was given IV tPA after noncontrast CT was negative for acute bleed.  Further cerebral imaging demonstrated left distal M1/proximal M2 occlusion.  He was taken to interventional radiology and underwent mechanical thrombolysis.  Post procedurally he was sent to the ICU for recovery and remained on the ventilator.  PCCM asked to consult.  Subjective:  Has required deep suctioning since extubation A fib + RVR, tachycardic last 18 hours  OBJECTIVE:  VITAL SIGNS: BP 136/79   Pulse 71   Temp (!) 97.4 F (36.3 C) (Axillary)   Resp (!) 30   Ht 6' 2.02" (1.88 m)   Wt 80.8 kg (178 lb 2.1 oz)   SpO2 100%   BMI 22.86 kg/m   VENTILATOR SETTINGS:    INTAKE / OUTPUT: I/O last 3 completed shifts: In: 1462.2 [I.V.:162.2; IV [id number]:[id number]] Out: 5075 [XYIAX:6553]  PHYSICAL EXAMINATION: General: chronically ill appearance, but no acute distress Neuro:  R is flaccid, L weak but moves,  marginally effective cough, aphasic, R [place] HEENT: some UA noise, secretions.  Cardiovascular: Irregular, tachycardic  Lungs:  Clear to auscultation anteriorly and posteriorly. No rales or rhonchi. Abdomen:  Soft, distended, NT and +BS Musculoskeletal:  No acute deformity or ROM limitation Skin:  Grossly intact  LABS:  BMET Recent Labs  Lab 08/26/17 0820 08/26/17 2257 08/27/17 0306  NA 145 146* 146*  K 2.3* 2.7* 3.2*  CL 104 104 107  CO2 26 25 23   BUN 19 19 19   CREATININE 0.74 0.81 0.69  GLUCOSE 134* 115* 124*    Electrolytes Recent Labs  Lab 08/25/17 0421 08/26/17 0820 08/26/17 2257 08/27/17 0306  CALCIUM 8.0* 8.1* 8.2* 8.1*  MG 2.2 1.9 1.9 1.9  PHOS 2.3* 3.1  --  3.0    CBC Recent Labs  Lab 08/25/17 0421 08/26/17 0820 08/27/17 0306  WBC 17.3* 21.7* 24.2*  HGB 10.8* 12.0* 12.0*  HCT 34.1* 37.1* 38.4*  PLT 257 285 263    Coag's No results for input(s): APTT, INR in the last 168 hours.  Sepsis Markers No results for input(s): LATICACIDVEN, PROCALCITON, O2SATVEN in the last 168 hours.  ABG Recent Labs  Lab 08/24/17 0424  PHART 7.465*  PCO2ART 31.1*  PO2ART 78.8*    Liver Enzymes Recent Labs  Lab 08/27/17 0306  AST 39  ALT 52  ALKPHOS 66  BILITOT 1.8*  ALBUMIN 2.7*    Cardiac Enzymes No results for input(s): TROPONINI, PROBNP in the last 168 hours.  Glucose Recent Labs  Lab 08/25/17 2043 08/26/17 0128 08/26/17 2027 08/27/17 0012 08/27/17  0355 08/27/17 0806  GLUCAP 111* 121* 123* 118* 114* 127*    Imaging Dg Chest Port 1 View  Result Date: 08/26/2017 CLINICAL DATA:  Dyspnea EXAM: PORTABLE CHEST 1 VIEW COMPARISON:  08/24/2017 chest radiograph. FINDINGS: Intact sternotomy wires. CABG clips overlie the mediastinum. Stable cardiomediastinal silhouette with top normal heart size and aortic atherosclerosis. No pneumothorax. Possible trace bilateral pleural effusions. Hazy opacities in left parahilar and bibasilar lungs, not  appreciably changed. IMPRESSION: No appreciable change in hazy left parahilar and bibasilar lung opacities, which are nonspecific and could reflect asymmetric mild pulmonary edema, aspiration or pneumonia. Electronically Signed   By: Ilona Sorrel M.D.   On: 08/26/2017 23:47    STUDIES:  CT head code stroke 1/7> no acute stroke, hemorrhage, or mass-effect identified.  Density in the left M2 superior division. CT Angie of head 1/7> distal left M1 occlusion extending into the left M2 superior division.  Bilateral paraclinoid ICA stenosis with near occlusion on the right.  CULTURES: Results for orders placed or performed during the hospital encounter of 08/18/17  MRSA PCR Screening     Status: None   Collection Time: 08/19/17  2:33 AM  Result Value Ref Range Status   MRSA by PCR NEGATIVE NEGATIVE Final    Comment:        The GeneXpert MRSA Assay (FDA approved for NASAL specimens only), is one component of a comprehensive MRSA colonization surveillance program. It is not intended to diagnose MRSA infection nor to guide or monitor treatment for MRSA infections.     ANTIBIOTICS: Perioperative cefazolin   SIGNIFICANT EVENTS: 1/7 presented with acute stroke, IV TPA given 1/8 taken to IR for mechanical thrombolysis.  Recovered in ICU on vent 1/14 one-way extubation  LINES/TUBES: Endotracheal tube 1/8>  DISCUSSION: 84 year old male presented as code stroke found to have Left MCA CVA. Taken to IR for revascularization. Post-op remained on vent. PCCM asked to see.   ASSESSMENT / PLAN:  PULMONARY A: ASPIRATION PNEUMONIA ENDOTRACHEAL INTUBATION for airway protection. Inability to protect airway in the postoperative setting and now since extubation due to CVA P: s/p "one-way" extubation. No reintubation > this would commit him to long term support. Stop Unasyn after 7 days Push pulm hygiene with chest vest, suctioning prn Titrate O2 for sat of 88-92%   CARDIOVASCULAR A:  Hypertension Atrial fibrillation with rapid ventricular response Coronary artery disease Internal carotid artery stenosis P: ICU hemodynamic monitoring Continue Cardizem gtt   INFECTIOUS A: ASPIRATION PNEUMONIA P: treated   NEUROLOGIC A:  Acute CVA Left MCA occlusion.  s/p IR revascularization 1/8 P:  Case discussed with Dr. Erlinda Hong   FAMILY  - Updates: updated family at bedside 1/15   Renee Pain, MD  08/27/2017, 10:31 AM [facility] [phone] or if no answer [phone]

## 2017-08-27 NOTE — Progress Notes (Signed)
Cortrak Tube Team Note:  Consult received to place a Cortrak feeding tube.   A 10 F Cortrak tube was placed in the left nare and secured with a nasal bridle at 75 cm. Per the Cortrak monitor reading the tube tip is gastric.   X-ray is required, abdominal x-ray has been ordered by the Cortrak team. Please confirm tube placement before using the Cortrak tube.   If the tube becomes dislodged please keep the tube and contact the Cortrak team at [email or web address] (password [email or web address]) for replacement.  If after hours and replacement cannot be delayed, place a NG tube and confirm placement with an abdominal x-ray.    Alanny Rivers MS, RD, LDN Pager #- [phone] After Hours Pager: [phone]   

## 2017-08-28 ENCOUNTER — Inpatient Hospital Stay (HOSPITAL_COMMUNITY): Payer: Medicare Other

## 2017-08-28 DIAGNOSIS — K117 Disturbances of salivary secretion: Secondary | ICD-10-CM

## 2017-08-28 DIAGNOSIS — Z515 Encounter for palliative care: Secondary | ICD-10-CM

## 2017-08-28 DIAGNOSIS — Z9189 Other specified personal risk factors, not elsewhere classified: Secondary | ICD-10-CM

## 2017-08-28 LAB — GLUCOSE, CAPILLARY
Glucose-Capillary: 122 mg/dL — ABNORMAL HIGH (ref 65–99)
Glucose-Capillary: 121 mg/dL — ABNORMAL HIGH (ref 65–99)
Glucose-Capillary: 137 mg/dL — ABNORMAL HIGH (ref 65–99)

## 2017-08-28 MED ORDER — METOPROLOL TARTRATE 5 MG/5ML IV SOLN
5.0000 mg | Freq: Four times a day (QID) | INTRAVENOUS | Status: DC
  Administered 2017-08-28 – 2017-08-30 (×6): 5 mg via INTRAVENOUS
  Filled 2017-08-28 (×8): qty 5

## 2017-08-28 MED ORDER — LORAZEPAM 2 MG/ML IJ SOLN: 1.0000 mg | INTRAMUSCULAR | Status: DC | PRN

## 2017-08-28 MED ORDER — GLYCOPYRROLATE 0.2 MG/ML IJ SOLN: 0.2000 mg | INTRAMUSCULAR | Status: DC | PRN

## 2017-08-28 MED ORDER — MORPHINE SULFATE (PF) 4 MG/ML IV SOLN
1.0000 mg | Freq: Four times a day (QID) | INTRAVENOUS | Status: DC
  Administered 2017-08-28 – 2017-08-29 (×3): 1 mg via INTRAVENOUS
  Filled 2017-08-28 (×3): qty 1

## 2017-08-28 MED ORDER — MORPHINE SULFATE (PF) 4 MG/ML IV SOLN
2.0000 mg | INTRAVENOUS | Status: DC | PRN
  Administered 2017-08-28: 2 mg via INTRAVENOUS
  Filled 2017-08-28: qty 1

## 2017-08-28 MED ORDER — SCOPOLAMINE 1 MG/3DAYS TD PT72
1.0000 | MEDICATED_PATCH | TRANSDERMAL | Status: DC
  Administered 2017-08-28: 1.5 mg via TRANSDERMAL
  Filled 2017-08-28: qty 1

## 2017-08-28 MED ORDER — MORPHINE SULFATE (PF) 4 MG/ML IV SOLN
2.0000 mg | INTRAVENOUS | Status: DC | PRN
  Administered 2017-08-28: 2 mg via INTRAVENOUS

## 2017-08-28 NOTE — Progress Notes (Signed)
NEUROHOSPITALISTS STROKE TEAM - DAILY PROGRESS NOTE   SUBJECTIVE (INTERVAL HISTORY) No family is at the bedside. Pt HR still high, maxed out cardizem, on lasix, however, worsening SOB with copious deep secretions and not able to clear by self. Failed swallow badly yesterday and had cortrak tube placed. On face mask this am, DVT yesterday negative. Due to poor prognosis, I discussed with son over the phone, he is thinking about palliative care but would like to discuss with pt and his brother. He is willing to have more discussion with palliative care today.      OBJECTIVE Lab Results: CBC:  Recent Labs  Lab 08/25/17 0421 08/26/17 0820 08/27/17 0306  WBC 17.3* 21.7* 24.2*  HGB 10.8* 12.0* 12.0*  HCT 34.1* 37.1* 38.4*  MCV 92.7 91.4 92.1  PLT 257 285 263   BMP: Recent Labs  Lab 08/22/17 0535  08/24/17 0344 08/25/17 0421 08/26/17 0820 08/26/17 2257 08/27/17 0306  NA 140   < > 143 145 145 146* 146*  K 3.3*   < > 3.1* 3.4* 2.3* 2.7* 3.2*  CL 118*   < > 112* 113* 104 104 107  CO2 16*   < > 20* 22 26 25 23   GLUCOSE 133*   < > 134* 123* 134* 115* 124*  BUN 20   < > 20 21* 19 19 19   CREATININE 0.62   < > 0.56* 0.60* 0.74 0.81 0.69  CALCIUM 6.4*   < > 8.2* 8.0* 8.1* 8.2* 8.1*  MG 1.9   < > 2.1 2.2 1.9 1.9 1.9  PHOS 1.7*  --  1.9* 2.3* 3.1  --  3.0   < > = values in this interval not displayed.   PHYSICAL EXAM Temp:  [97.6 F (36.4 C)-98.5 F (36.9 C)] 97.9 F (36.6 C) (01/17 0800) Pulse Rate:  [40-147] 133 (01/17 0900) Resp:  [21-52] 34 (01/17 0900) BP: (116-154)/(68-142) 139/86 (01/17 0900) SpO2:  [76 %-98 %] 96 % (01/17 0900) Weight:  [174 lb 13.2 oz (79.3 kg)] 174 lb 13.2 oz (79.3 kg) (01/17 0430) General - Well nourished, well developed, in no apparent distress, Intubated and sedated HEENT-  Normocephalic,   Cardiovascular - Iregular rate and rhythm  Respiratory -  No wheezing. Abdomen - soft and  non-tender, Extremities- 1+ edema NEURO:  Mental Status: Awake, mostly non-verbal but interactive with examiner, holding hand and making hand gestures. Following only very simple commands, Right side neglect Cranial Nerves: PERRL gaze preference to the left, slight hypertropia of both eyes unable to cross the midline.  No blink to threat from right site, right lower facial weakness. Motor: 0/5 right upper extremity, minimal withdrawal to noxious stimulus in the right lower extremity, 4/5 left upper extremity, 3+ LLE. Tone: is decreased and bulk is normal DTR 1+ and no babinski Sensation, coordination and gait not tested.  IMAGING: I have personally reviewed the radiological images below and agree with the radiology interpretations.  Ct Head Wo Contrast Result Date: 08/19/2017 IMPRESSION: Loss of gray-white differentiation within the insula and frontal operculum compatible with evolving acute infarction. No additional area of infarction identified at this time. No intracranial hemorrhage or mass effect.  CTA Head/Neck and Cerebral Perfusion W Contrast Result Date: 08/18/2017 IMPRESSION: CTA neck: 1. Patent carotid and vertebral arteries of the neck. 2. Dense calcified plaque of bilateral carotid bifurcations with proximal ICA moderate 60-70% stenosis. 3. Moderate stenosis of right vertebral artery origin. CTA head: 1. Distal left M1 occlusion extending into left M2  superior division. Early reconstitution of left M2 inferior division via collateralization. Poor downstream collateralization and left superior MCA distribution. 2. Left paraclinoid ICA severe stenosis. 3. Right paraclinoid and terminal ICA occlusion or near occlusion. Contrast opacification of the right upper cervical ICA diminishes gradually to the terminal segment, severe stenosis was present on the prior study. Findings may represent chronic severe stenosis with poor antegrade flow or interval thrombosis. CT brain perfusion: 1. Core  infarct volume of 71 cc. 2. Mismatch ischemic penumbra of 102 cc. 3. Infarct location left superior MCA distribution.   Portable Chest Xray Result Date: 08/24/2017 IMPRESSION: Worsening bilateral airspace disease which could reflect edema or Infection  Portable Chest Xray Result Date: 08/22/2017 IMPRESSION: Stable hazy bilateral airspace disease  Portable Chest Xray Result Date: 08/19/2017 IMPRESSION: Endotracheal tube 4.8 cm from carina. Aortic atherosclerosis. No acute pulmonary process identified.   Ct Head Code Stroke Wo Contrast Result Date: 08/18/2017 IMPRESSION: 1. No acute stroke, hemorrhage, or mass effect identified. 2. Density in left M2 superior division, possible thrombus. 3. ASPECTS is 10 4. Stable chronic microvascular ischemic changes and parenchymal volume loss of the brain.   Echocardiogram:                                               Study Conclusions - Left ventricle: Posterior basal hypokinesis. Wall thickness was   increased in a pattern of mild LVH. The estimated ejection   fraction was 55%. The study is not technically sufficient to   allow evaluation of LV diastolic function. - Mitral valve: Moderately calcified annulus. Moderately thickened,   moderately calcified leaflets . There was mild regurgitation. - Left atrium: The atrium was mildly dilated. - Atrial septum: No defect or patent foramen ovale was identified.  MRI BRAIN:  1. Patchy multifocal acute ischemic infarcts involving the bilateral cerebral hemispheres, with additional 1 cm left cerebellar infarct as above. Associated petechial hemorrhage at the left frontal operculum without hemorrhagic transformation. 2. Underlying mild chronic microvascular ischemic disease.  LE venous doppler - negative for DVT      IMPRESSION: Thomas Hanna is a 84 y.o. male with PMH  of CAD, prostate cancer, left thalamic stroke with right-sided weakness in 08/2016 admitted for right-sided weakness, left  gaze and aphasia.  Status post TPA.  CT head and neck showed left M1 occlusion, bilateral ICA proximal 60-70% stenosis.  Right terminal ICA occlusion or near occlusion.  Had mechanical thrombectomy with TICI2b reperfusion.  Found to have A. fib with RVR, put on Cardizem IV.  MRI showed left cerebellum, left MCA, ACA and right MCA patchy moderate sized infarcts.  Stroke consistent with embolic stroke due to newly diagnosed A. fib.  EF 55%, LDL 87 and A1c 5.7.     Acute ischemic strokes in multiple vascular distributions status post IV TPA and mechanical thrombectomy  Suspected Etiology: likely cardio embolic given new onset AFIB   Resultant Symptoms:  right-sided weakness, left-sided gaze preference and a aphasia. Stroke Risk Factors: hyperlipidemia and hypertension, AFIB Other Stroke Risk Factors: Advanced age, Hx stroke, CAD, Prostate Cancer, Hx of MI  PROCEDURES:  08/19/2017  Dr Estanislado Pandy S/P Lt common carotid arteriogram followed by revascularization of Lt MCA distal M1 and mid perisylvian M3 branch  with mechanical thrombolysis achieving a TICI 2 b reperfusion.  ASSESSMENT:   Patient extubated overnight. AFIB with RVR and  Episode of hypoxia overnight. To be transferred back to ICU for Cardizem drip. Awake, mostly non-verbal. Following some very basic commands. Moving LUE spontaneously and occasionally LLE. No spontaneous movement of Right side. +withdrawal to pain. Family at bedside, hopeful he will start to improve.  PLAN  Continue ASA, for now Frequent neuro checks Telemetry monitoring PT/OT/SLP Consult PM & Rehab Consult Case Management /MSW Ongoing aggressive stroke risk factor management Patient will be counseled to be compliant with his antithrombotic medications Patient will be counseled on Lifestyle modifications including, Diet, Exercise, and Stress Follow up with [facility] in 6 weeks  HX OF STROKES: Left thalamic stroke with mild residual right hemiparesis  since January 2018  INTRACRANIAL Atherosclerosis &Stenosis: ASA started for now  DYSPHAGIA: Failed swallow, had cortrak Aspiration Precautions in progress [place] in progress  MEDICAL ISSUES: CCM following, Appreciate Assistance  Inability to protect airway in the postoperative setting Some Hypoxia overnight, now on face mask SOB Family in agreement that they will not re-intubate at this time  Aspiration Pneumonia Unasyn and Vanco in progress Will stop antibiotics after 7 day course, O2 increased, respiratory following, Proventil & Duonebs & Pulmonary Toilet in progress  Acute Diastolic CHF Cardiology following IV Lasix started yesterday, +12.5L +Diuresing, Foley in place   AFIB with RVR, NEW ONSET:  A. Fib with RVR overnight, Metoprolol IV given Cardiology to have patient transferred back to ICU for Cardizem drip Cardiology Consultation, Appreciate assistance Continue to HOLD [facility] - for now Will need long-term anticoagulation once safe from a stroke standpoint, if appropriate  Leukocytosis Remains afebrile Will obtain chest CXR and U/A Will continue to monitor closely Repeat labs in AM  Hypokalemia IV Replacement in progress Repeat labs in AM  Hypocalcemia Will monitor trend for now Repeat labs in AM  Mild anemia, Likely chronic at baseline  Hgb 12.0 / Hct 37.1 - trending up Repeat labs in AM  HYPERTENSION: Stable overnight PRN Labetalol Change blood pressure goal below 110 -180.  Norepinephrine drip Discontinued Long term BP goal normotensive. Home Meds: NONE  HYPERLIPIDEMIA:    Component Value Date/Time   CHOL 140 08/19/2017 0304   TRIG 86 08/19/2017 0304   HDL 36 (L) 08/19/2017 0304   CHOLHDL 3.9 08/19/2017 0304   VLDL 17 08/19/2017 0304   LDLCALC 87 08/19/2017 0304  Home Meds:  Lovaza LDL  goal < 70 Will continued Lovaza and start Lipitor to 10 mg daily, if passes SLP evaluation Continue statin at discharge, if appropriate  R/O  DIABETES: Lab Results  Component Value Date   HGBA1C 5.7 (H) 08/19/2017  HgbA1c goal < 7.0  Other Active Problems: Principal Problem:   Acute ischemic left MCA stroke (HCC) Active Problems:   Coronary artery disease due to lipid rich plaque   Normocytic anemia   Coronary artery disease involving coronary bypass graft of native heart without angina pectoris   Pressure injury of skin   Atrial fibrillation with RVR (HCC)   Bilateral carotid artery stenosis   Aspiration pneumonia (HCC)   Ileus (HCC)   Endotracheally intubated   Hypokalemia   Hypertension   Palliative care by specialist   Goals of care, counseling/discussion   Paroxysmal atrial fibrillation ([place])   At high risk for aspiration   SOB (shortness of breath)   Right hemiplegia ([place])   [facility] day # 10 VTE prophylaxis: SCD's  Diet : Diet NPO time specified , will need to address NGT with family BM: Last documented  08/24/2017, Bowel meds in progress  FAMILY UPDATES: family at bedside  TEAM UPDATES: Rosalin Hawking, MD STATUS:  DNR, Palliative following, Appreciate Assistance. Will transition to comfort care if he declines. Watchful waiting.   Prior Home Stroke Medications:  clopidogrel 75 mg daily  Discharge Stroke Meds:  Please discharge patient on TBD   Disposition: 01-Home or Self Care Therapy Recs:               PENDING Home Equipment:         PENDING Follow Up:  Follow-up Information    Garvin Fila, MD. Schedule an appointment as soon as possible for a visit in 6 week(s).   Specialties:  Neurology, Radiology Contact information: [address] [place] [address] [place] Not In -PCP Follow up in 1-2 weeks   Case Management aware of need     Mary Sella. ANP-C Stroke Neurology Team 08/28/2017 10:13 AM  ATTENDING NOTE: I have personally examined this patient, reviewed notes, independently viewed imaging studies, participated in medical  decision making and plan of care. I have made any additions or clarifications directly to the above note.   84 year old male with history of CAD, prostate cancer, left thalamic stroke with right-sided weakness in 08/2016 admitted for right-sided weakness, left gaze and aphasia.  Status post TPA.  CT head and neck showed left M1 occlusion, bilateral ICA proximal 60-70% stenosis.  Right terminal ICA occlusion or near occlusion.  Had mechanical thrombectomy with TICI2b reperfusion.  Found to have A. fib with RVR, put on Cardizem IV.  MRI showed left cerebellum, left MCA, ACA and right MCA patchy moderate sized infarcts.  Stroke consistent with embolic stroke due to newly diagnosed A. fib.  EF 55%, LDL 87 and A1c 5.7.    Patient extubated on 08/25/17 and transferred to floor, but developed afib RVR and hypoxia over night. Transfer back to ICU, and restarted cardizem IV. Failed swallow and put on cortrak. However, overnight, worsening respiratory issue, on non-rebreather and now on face mask, SOB, tachypenia, tachycardia, maxed on Cardizem. Copious secretion. DVT negative. WBC significantly elevated, UA neg for UTI. Pt is DNR/DNI.  Due to poor prognosis, I discussed with son over the phone, he is thinking about palliative care but would like to discuss with pt and his brother. He is willing to have more discussion with palliative care today.  Rosalin Hawking, MD PhD Stroke Neurology 08/25/2017 2:07 PM  This patient is critically ill and at significant risk of neurological worsening, death and care requires constant monitoring of vital signs, hemodynamics,respiratory and cardiac monitoring, extensive review of multiple databases, frequent neurological assessment, discussion with family, other specialists and medical decision making of high complexity. I had long discussion with son over the phone, updated pt current condition, treatment plan and poor prognosis. He agrees to discuss with palliative care to  consider comfort care but would like to discuss with his brother and pt first. Discussed with Dr. Carson Myrtle. I spent 45 minutes of neurocritical care time  in the care of  this patient.    To contact Stroke Continuity provider, please refer to [email or web address] After hours, contact General Neurology

## 2017-08-28 NOTE — Progress Notes (Signed)
OT Cancellation Note  Patient Details Name: Thomas Hanna MRN: [id number] DOB: 09/09/1923   Cancelled Treatment:    Reason Eval/Treat Not Completed: Other (comment). Not approiprate at this time. Consultin gpalliative. If OT eval needed, please reorder. thanks  [place], OT/L  [phone] 08/28/2017 08/28/2017, 1:10 PM

## 2017-08-28 NOTE — Progress Notes (Addendum)
Daily Progress Note   Patient Name: Thomas Hanna       Date: 08/28/2017 DOB: Nov 05, 1923  Age: 84 y.o. MRN#: [id number] Attending Physician: Rosalin Hawking, MD Primary Care Physician: System, Pcp Not In Admit Date: 08/18/2017  Reason for Consultation/Follow-up: Establishing goals of care  Subjective: Patient wakes to voice but does not follow commands. Tachypnea RR 30-35 on NRB. Using accessory muscles. The patient denies pain for son, [name] at bedside.   GOC:  Spoke at length with [name] and DIL Leafy Ro) in conference room. Discussed course of hospitalization, diagnoses, and interventions. [name] states "we are headed in the wrong direction" and seems to be accepting poor prognosis. They do feel like he is worse today, with decreased mental status and drowsiness. They are glad he is not in any pain. I explained his high respiratory rate and copious secretions can be a sign of discomfort. Also higher oxygen requirements and elevated HR when nurse attempted NT suctioned.   [name] ask many questions about comfort care. Explained transition from aggressive medical interventions to comfort focused care with goal of comfort and dignity at EOL. I explained that this would be discontinuing interventions not aimed at comfort and focusing on symptom management to relief suffering. Educated on medications for symptom management. Educated on EOL expectations and likely prognosis of days.   [name] and Jal do not want him to suffer. They confirm that he would not want to live with heroic means such as trach or feeding tube. He has remained a healthy, active individual and they know continuing aggressive interventions will not give him a better quality of life. They are reassured by the fact that they "tried"  and "gave him a chance" after extubation. They also feel blessed to have a few good days with him with the tube out.   Phineas Semen, and I decided on transition to comfort measures. [name] requests we leave cardiac medications for now. I explained transition to 6N for comfort care, which they agree with. I did explain that I will discontinue cardizem gtt since he will no longer be on cardiac monitor. [name] and Leafy Ro would like feeding tube taken out. We discussed transitioning back to nasal cannula when he is more comfortable. Also discussed comfort feeds or ice chips, which has made his throat feel better. Explained that we will  treat dyspnea with prn morphine (or scheduled if he is needing more) instead of escalating care. [name] and [place] confirm understanding.  They share many stories of Mr. Paulla Fore. Spiritual family of [place]. Emotional and spiritual support provided.    Length of Stay: 10  Current Medications: Scheduled Meds:  . chlorhexidine gluconate (MEDLINE KIT)  15 mL Mouth Rinse BID  . furosemide  40 mg Intravenous Q12H  . glycopyrrolate  0.4 mg Intravenous Q4H  . mouth rinse  15 mL Mouth Rinse BID  . metoprolol tartrate  5 mg Intravenous QID  . scopolamine  1 patch Transdermal Q72H    Continuous Infusions:   PRN Meds: acetaminophen **OR** acetaminophen (TYLENOL) oral liquid 160 mg/5 mL **OR** acetaminophen, albuterol, bisacodyl, glycopyrrolate, labetalol, LORazepam, morphine injection, ondansetron, phenol  Physical Exam  Constitutional: He is easily aroused. He appears ill.  HENT:  Head: Normocephalic and atraumatic.  Cardiovascular: An irregularly irregular rhythm present.  Afib  Pulmonary/Chest: Accessory muscle usage present. Tachypnea noted. He has rhonchi.  Audible secretions. On NRB  Abdominal: Normal appearance. There is no tenderness.  Neurological: He is easily aroused.  Drowsy  Skin: Skin is warm and dry. There is pallor.  Psychiatric: He is  noncommunicative. He is inattentive.  Nursing note and vitals reviewed.          Vital Signs: BP (!) 138/102   Pulse 87   Temp 97.6 F (36.4 C) (Oral)   Resp (!) 37   Ht 6' 2.02" (1.88 m)   Wt 79.3 kg (174 lb 13.2 oz)   SpO2 100%   BMI 22.44 kg/m  SpO2: SpO2: 100 % O2 Device: O2 Device: NRB O2 Flow Rate: O2 Flow Rate (L/min): 10 L/min  Intake/output summary:   Intake/Output Summary (Last 24 hours) at 08/28/2017 1237 Last data filed at 08/28/2017 1200 Gross per 24 hour  Intake 603.3 ml  Output 675 ml  Net -71.7 ml   LBM: Last BM Date: 08/24/17 Baseline Weight: Weight: 82.9 kg (182 lb 12.2 oz) Most recent weight: Weight: 79.3 kg (174 lb 13.2 oz)       Palliative Assessment/Data:  PPS 10%   Flowsheet Rows     Most Recent Value  Intake Tab  Referral Department  Critical care  Unit at Time of Referral  ICU  Palliative Care Primary Diagnosis  Neurology  Date Notified  08/24/17  Palliative Care Type  New Palliative care  Reason for referral  Clarify Goals of Care  Date of Admission  08/18/17  Date first seen by Palliative Care  08/24/17  # of days Palliative referral response time  0 Day(s)  # of days IP prior to Palliative referral  6  Clinical Assessment  Palliative Performance Scale Score  30%  Pain Max last 24 hours  Not able to report  Pain Min Last 24 hours  Not able to report  Dyspnea Max Last 24 Hours  Not able to report  Dyspnea Min Last 24 hours  Not able to report  Nausea Max Last 24 Hours  Not able to report  Nausea Min Last 24 Hours  Not able to report  Anxiety Max Last 24 Hours  Not able to report  Anxiety Min Last 24 Hours  Not able to report  Other Max Last 24 Hours  Not able to report  Psychosocial & Spiritual Assessment  Palliative Care Outcomes  Patient/Family meeting held?  Yes  Who was at the meeting?  son and DIL  Patient/Family wishes: Interventions discontinued/not  started   Lurline Idol, [facility]  Palliative Care follow-up planned  Yes, Facility        Patient Active Problem List   Diagnosis Date Noted  . At high risk for aspiration   . SOB (shortness of breath)   . Right hemiplegia ([place])   . Aphasia   . Goals of care, counseling/discussion   . Paroxysmal atrial fibrillation (HCC)   . Palliative care by specialist   . Hypokalemia 08/22/2017  . Hypertension 08/22/2017  . Aspiration pneumonia ([place]) 08/21/2017  . Ileus ([place]) 08/21/2017  . Endotracheally intubated   . Bilateral carotid artery stenosis 08/20/2017    Class: Chronic  . Pressure injury of skin 08/19/2017  . Atrial fibrillation with RVR ([place])   . Acute ischemic left MCA stroke ([place]) 08/18/2017  . Cervical paraspinal muscle spasm 02/18/2017  . MCI (mild cognitive impairment) 10/22/2016  . Orthostatic hypertension 09/13/2016  . Coronary artery disease involving coronary bypass graft of native heart without angina pectoris 09/13/2016  . Abnormal MRI of head 09/13/2016  . Orthostatic hypotension 09/03/2016  . CAD S/P percutaneous coronary angioplasty 09/03/2016  . Hx of CABG 09/03/2016  . Dyslipidemia 09/03/2016  . Coronary artery disease due to lipid rich plaque 08/29/2016  . Hyponatremia 08/29/2016  . Elevated LFTs 08/29/2016  . Normocytic anemia 08/29/2016  . Syncope 08/28/2016    Palliative Care Assessment & Plan   Patient Profile: 84 y.o. male  with past medical history of left thalamic stroke (January 2018), atrial fib, hypertension, hyperlipidemia admitted on 08/18/2017 with right-sided weakness, left-sided gaze preference, aphasia.  Last known well at 1900 on 08/18/2017.  He presented to the emergency department as a code stroke and was given IV TPA on 08/18/2016.  Additionally, he underwent thrombo-lysis on 08/19/2017.  He has been intubated and is in ICU. Extubated 1/14. Consult ordered for goals of care.  Assessment: Acute CVA-left MCA occlusion Acute respiratory failure Afib RVR Bilateral carotid artery stenosis Aspiration  pneumonia CAD  Recommendations/Plan:  DNR/DNI  Transition to comfort measures. Discontinued medications/interventions not aimed at comfort. Family prefers to continue cardiac medications for now.   Remove cortrack. Comfort feeds or ice per patient/family request.  Symptom management  Scheduled Morphine 17m q6h   Morphine 2-412mIV q1h prn pain/dyspnea/tachypnea  Ativan 31m58mV q4h prn anxiety  Continue scheduled Robinul  Added scopolamine patch  May give Robinul 0.2mg531m q4h prn copious secretions  May consider starting morphine infusion if he is requiring frequent prn morphine.   Comfort screen. No escalation of care. Give prn morphine for dyspnea.   Transfer to 6N for comfort care.   PMT will continue to follow and support patient/family. Will further discuss hospice facility if appropriate.   Code Status:  DNR   Code Status Orders  (From admission, onward)        Start     Ordered   08/20/17 1411  Do not attempt resuscitation (DNR)  Continuous    Question Answer Comment  In the event of cardiac or respiratory ARREST Do not call a "code blue"   In the event of cardiac or respiratory ARREST Do not perform Intubation, CPR, defibrillation or ACLS   In the event of cardiac or respiratory ARREST Use medication by any route, position, wound care, and other measures to relive pain and suffering. May use oxygen, suction and manual treatment of airway obstruction as needed for comfort.      08/20/17 1410    Code Status History    Date  Active Date Inactive Code Status Order ID Comments User Context   08/19/2017 02:18 08/20/2017 14:10 Full Code [id number]  Luanne Bras, MD Inpatient   08/18/2017 22:37 08/19/2017 02:18 Full Code [id number]  Amie Portland, MD ED   09/03/2016 16:05 09/09/2016 19:20 Full Code [id number]  Erlene Quan, PA-C ED   08/29/2016 00:39 08/29/2016 16:48 Full Code [id number]  Edwin Dada, MD Inpatient       Prognosis:   < 2 weeks: likely  days  Discharge Planning:  To Be Determined: hospital death versus hospice facility if stable for transfer  Care plan was discussed with  Son, DIL, RN, Dr. Erlinda Hong, Dr Harrington Challenger  Thank you for allowing the Palliative Medicine Team to assist in the care of this patient.   Time In: 1125 Time Out: 1245 Total Time 48mn Prolonged Time Billed yes      Greater than 50%  of this time was spent counseling and coordinating care related to the above assessment and plan.  MIhor Dow FNP-C Palliative Medicine Team  Phone: [phone]: [phone] Please contact Palliative Medicine Team phone at [phone] questions and concerns.

## 2017-08-28 NOTE — Progress Notes (Signed)
PULMONARY / CRITICAL CARE MEDICINE   Name: Thomas Hanna MRN: [id number] DOB: 11/13/1923    ADMISSION DATE:  08/18/2017 CONSULTATION DATE: 08/19/2017  REFERRING MD: Dr. Leonie Man  CHIEF COMPLAINT: Stroke   SUBJECTIVE:  -Return to ICU for atrial fib with slow ventricular response.  He is a full DNR.  Most likely will experience respiratory distress due to inability to protect his airway.   HISTORY OF PRESENT ILLNESS:   This 84 year old male is seen in consultation at the request of Dr. Leonie Man previous left thalamic stroke with mild residual right hemiparesis(January 2018) presented to [facility] emergency department on 1/7 with complaints of right-sided weakness, left-sided gaze preference, and aphasia.  Last known well at 1900 on 1/7.  He presented to the emergency department as a code stroke and was given IV tPA after noncontrast CT was negative for acute bleed.  Further cerebral imaging demonstrated left distal M1/proximal M2 occlusion.  He was taken to interventional radiology and underwent mechanical thrombolysis.  Post procedurally he was sent to the ICU for recovery and remained on the ventilator.  PCCM asked to consult.  Subjective:  Requires frequent NTS suctioning for inability to clear oral secretions.  OBJECTIVE:  VITAL SIGNS: BP 137/90   Pulse 100   Temp 97.9 F (36.6 C) (Oral)   Resp (!) 34   Ht 6' 2.02" (1.88 m)   Wt 79.3 kg (174 lb 13.2 oz)   SpO2 95%   BMI 22.44 kg/m   VENTILATOR SETTINGS:    INTAKE / OUTPUT: I/O last 3 completed shifts: In: 1269.4 [I.V.:469.4; IV [id number]:580] Out: 9983 [Urine:3485]  PHYSICAL EXAMINATION: General: Frail thin elderly white male HEENT: Copious oral secretions PSY: As per stroke Neuro: Left arm but no follows commands CV: Heart sounds are regular regular with atrial fibrillation with a ventricular rate of 86 PULM: Coarse rhonchi bilaterally, requiring NTS suctioning every 2-3 hours copious oral secretions JA:SNKN,  non-tender, bsx4 active  Extremities: warm/dry, plus edema  Skin: no rashes or lesions\  LABS:  BMET Recent Labs  Lab 08/26/17 0820 08/26/17 2257 08/27/17 0306  NA 145 146* 146*  K 2.3* 2.7* 3.2*  CL 104 104 107  CO2 26 25 23   BUN 19 19 19   CREATININE 0.74 0.81 0.69  GLUCOSE 134* 115* 124*    Electrolytes Recent Labs  Lab 08/25/17 0421 08/26/17 0820 08/26/17 2257 08/27/17 0306  CALCIUM 8.0* 8.1* 8.2* 8.1*  MG 2.2 1.9 1.9 1.9  PHOS 2.3* 3.1  --  3.0    CBC Recent Labs  Lab 08/25/17 0421 08/26/17 0820 08/27/17 0306  WBC 17.3* 21.7* 24.2*  HGB 10.8* 12.0* 12.0*  HCT 34.1* 37.1* 38.4*  PLT 257 285 263    Coag's No results for input(s): APTT, INR in the last 168 hours.  Sepsis Markers No results for input(s): LATICACIDVEN, PROCALCITON, O2SATVEN in the last 168 hours.  ABG Recent Labs  Lab 08/24/17 0424  PHART 7.465*  PCO2ART 31.1*  PO2ART 78.8*    Liver Enzymes Recent Labs  Lab 08/27/17 0306  AST 39  ALT 52  ALKPHOS 66  BILITOT 1.8*  ALBUMIN 2.7*    Cardiac Enzymes No results for input(s): TROPONINI, PROBNP in the last 168 hours.  Glucose Recent Labs  Lab 08/27/17 1140 08/27/17 1608 08/27/17 1956 08/27/17 2347 08/28/17 0338 08/28/17 0751  GLUCAP 115* 126* 130* 129* 122* 121*    Imaging Dg Abd Portable 1v  Result Date: 08/27/2017 CLINICAL DATA:  Feeding tube placement. EXAM: PORTABLE ABDOMEN - 1  VIEW COMPARISON:  08/21/2017 FINDINGS: Removal of nasogastric tube. Feeding tube terminates at the distal stomach. Cardiomegaly. Nonobstructive bowel-gas pattern. Small amount of contrast identified about the proximal stomach or transverse colon. Median sternotomy. Left lower lobe atelectasis. IMPRESSION: Feeding tube terminating at the distal stomach. Electronically Signed   By: Abigail Miyamoto M.D.   On: 08/27/2017 15:46   Dg Swallowing Func-speech Pathology  Result Date: 08/27/2017 Objective Swallowing Evaluation: Type of Study:  MBS-Modified Barium Swallow Study  Patient Details Name: Thomas Hanna MRN: [id number] Date of Birth: [birth year] Today's Date: 08/27/2017 Time: SLP Start Time (ACUTE ONLY): 1410 -SLP Stop Time (ACUTE ONLY): 1436 SLP Time Calculation (min) (ACUTE ONLY): 26 min Past Medical History: Past Medical History: Diagnosis Date . Coronary artery disease  . MI (myocardial infarction) ([place])  . Orthostatic hypotension 08/2016 . Prostate cancer ([place])  . Stroke [facility])  Past Surgical History: Past Surgical History: Procedure Laterality Date . cardiac stents   . CARDIAC SURGERY   . CORONARY ARTERY BYPASS GRAFT   . IR ANGIO EXTRACRAN SEL COM CAROTID INNOMINATE UNI R MOD SED  08/19/2017 . IR ANGIO VERTEBRAL SEL SUBCLAVIAN INNOMINATE UNI R MOD SED  08/19/2017 . IR PERCUTANEOUS ART THROMBECTOMY/INFUSION INTRACRANIAL INC DIAG ANGIO  08/19/2017 . RADIOLOGY WITH ANESTHESIA N/A 08/18/2017  Procedure: RADIOLOGY WITH ANESTHESIA;  Surgeon: Luanne Bras, MD;  Location: [facility];  Service: Radiology;  Laterality: N/A; HPI: 84 y.o.malewith past medical history of left thalamic stroke (January 2018), atrial fib, hypertension, hyperlipidemiaadmitted on1/7/2019with right-sided weakness, left-sided gaze preference, aphasia. He presented to the emergency department as a code stroke and was given IV TPA on 08/18/2016. Additionally, he underwent thrombo-lysis on 08/19/2017. He has been intubated and is in ICU.Palliative care consulted on 1/14 with resulting one way extubation. Pt has struggled with management of secretions overnight with episodes of hypoxia. Initial CXR clear, most recent shows Worsening bilateral airspace disease which could reflect edema or infection. No prior SLP evaluations documented.  No Data Recorded Assessment / Plan / Recommendation CHL IP CLINICAL IMPRESSIONS 08/27/2017 Clinical Impression Pt demonstrates profound dysphagia with oral and neuromuscular and sensory oral impairment. Pt is globally aphasic and cannot follow commands.  Any trials of small sized textrues (solid and liquids) resultin prolonged lingual pumping and absent bolus formation despite use of verbal visual and tactiel cues. Swallow only elicited by increasing bolus size or allowing self feeding, which resulted in spillage to pharynx and aspiration before the swallow with minimal sensation. Cough, once elicited is late and weak, not effective in ejecting large quantity of aspirate. When swallow is triggered there is decreased laryngeal elevation, hyoid excursion and pharyngeal contraction with severe residuals. Cannot recommend diet unless full comfort measures are initiated. Will continue therapeutic interventions at bedside with education to family. Hopeful for improvement in strength of reflexive cough with more time after extubation, but prognosis poor for return to adequate PO intake.  SLP Visit Diagnosis Dysphagia, oropharyngeal phase (R13.12) Attention and concentration deficit following -- Frontal lobe and executive function deficit following Cerebral infarction Impact on safety and function --   CHL IP TREATMENT RECOMMENDATION 08/26/2017 Treatment Recommendations Therapy as outlined in treatment plan below   No flowsheet data found. CHL IP DIET RECOMMENDATION 08/27/2017 SLP Diet Recommendations NPO;Ice chips PRN after oral care Liquid Administration via -- Medication Administration Via alternative means Compensations -- Postural Changes --   CHL IP OTHER RECOMMENDATIONS 08/27/2017 Recommended Consults -- Oral Care Recommendations Oral care QID Other Recommendations --   CHL IP FOLLOW UP RECOMMENDATIONS  08/27/2017 Follow up Recommendations Skilled Nursing facility   [facility] IP FREQUENCY AND DURATION 08/27/2017 Speech Therapy Frequency (ACUTE ONLY) min 2x/week Treatment Duration 2 weeks      CHL IP ORAL PHASE 08/27/2017 Oral Phase Impaired Oral - Pudding Teaspoon -- Oral - Pudding Cup -- Oral - Honey Teaspoon Weak lingual manipulation;Lingual pumping;Reduced posterior  propulsion;Delayed oral transit;Decreased bolus cohesion;Holding of bolus Oral - Honey Cup -- Oral - Nectar Teaspoon Weak lingual manipulation;Lingual pumping;Reduced posterior propulsion;Delayed oral transit;Decreased bolus cohesion;Holding of bolus Oral - Nectar Cup Weak lingual manipulation;Lingual pumping;Reduced posterior propulsion;Delayed oral transit;Decreased bolus cohesion;Holding of bolus Oral - Nectar Straw -- Oral - Thin Teaspoon -- Oral - Thin Cup -- Oral - Thin Straw -- Oral - Puree Weak lingual manipulation;Lingual pumping;Reduced posterior propulsion;Delayed oral transit;Decreased bolus cohesion;Holding of bolus Oral - Mech Soft -- Oral - Regular -- Oral - Multi-Consistency -- Oral - Pill -- Oral Phase - Comment --  CHL IP PHARYNGEAL PHASE 08/27/2017 Pharyngeal Phase Impaired Pharyngeal- Pudding Teaspoon -- Pharyngeal -- Pharyngeal- Pudding Cup -- Pharyngeal -- Pharyngeal- Honey Teaspoon Delayed swallow initiation-pyriform sinuses;Reduced pharyngeal peristalsis;Reduced epiglottic inversion;Reduced anterior laryngeal mobility;Reduced laryngeal elevation;Reduced airway/laryngeal closure;Reduced tongue base retraction;Penetration/Aspiration before swallow;Pharyngeal residue - valleculae;Pharyngeal residue - pyriform;Significant aspiration (Amount) Pharyngeal Material enters airway, passes BELOW cords and not ejected out despite cough attempt by patient Pharyngeal- Honey Cup -- Pharyngeal -- Pharyngeal- Nectar Teaspoon Delayed swallow initiation-pyriform sinuses;Reduced pharyngeal peristalsis;Reduced epiglottic inversion;Reduced anterior laryngeal mobility;Reduced laryngeal elevation;Reduced airway/laryngeal closure;Reduced tongue base retraction;Penetration/Aspiration before swallow;Pharyngeal residue - valleculae;Pharyngeal residue - pyriform;Significant aspiration (Amount) Pharyngeal Material enters airway, passes BELOW cords and not ejected out despite cough attempt by patient;Material enters airway,  passes BELOW cords without attempt by patient to eject out (silent aspiration) Pharyngeal- Nectar Cup Delayed swallow initiation-pyriform sinuses;Reduced pharyngeal peristalsis;Reduced epiglottic inversion;Reduced anterior laryngeal mobility;Reduced laryngeal elevation;Reduced airway/laryngeal closure;Reduced tongue base retraction;Penetration/Aspiration before swallow;Pharyngeal residue - valleculae;Pharyngeal residue - pyriform;Significant aspiration (Amount) Pharyngeal Material enters airway, passes BELOW cords without attempt by patient to eject out (silent aspiration);Material enters airway, passes BELOW cords and not ejected out despite cough attempt by patient Pharyngeal- Nectar Straw -- Pharyngeal -- Pharyngeal- Thin Teaspoon -- Pharyngeal -- Pharyngeal- Thin Cup -- Pharyngeal -- Pharyngeal- Thin Straw -- Pharyngeal -- Pharyngeal- Puree Other (Comment) Pharyngeal -- Pharyngeal- Mechanical Soft -- Pharyngeal -- Pharyngeal- Regular -- Pharyngeal -- Pharyngeal- Multi-consistency -- Pharyngeal -- Pharyngeal- Pill -- Pharyngeal -- Pharyngeal Comment --  No flowsheet data found. No flowsheet data found. [name], [place] CCC-SLP [phone] [place], [name] 08/27/2017, 2:55 PM               STUDIES:  CT head code stroke 1/7> no acute stroke, hemorrhage, or mass-effect identified.  Density in the left M2 superior division. CT Angie of head 1/7> distal left M1 occlusion extending into the left M2 superior division.  Bilateral paraclinoid ICA stenosis with near occlusion on the right.  CULTURES: Results for orders placed or performed during the hospital encounter of 08/18/17  MRSA PCR Screening     Status: None   Collection Time: 08/19/17  2:33 AM  Result Value Ref Range Status   MRSA by PCR NEGATIVE NEGATIVE Final    Comment:        The GeneXpert MRSA Assay (FDA approved for NASAL specimens only), is one component of a comprehensive MRSA colonization surveillance program. It is not intended  to diagnose MRSA infection nor to guide or monitor treatment for MRSA infections.     ANTIBIOTICS: Perioperative cefazolin   SIGNIFICANT EVENTS:  1/7 presented with acute stroke, IV TPA given 1/8 taken to IR for mechanical thrombolysis.  Recovered in ICU on vent 1/14 one-way extubation 08/27/2017 return to the ICU for atrial fibrillation ventricular response  LINES/TUBES: Endotracheal tube 1/8>  08/25/2016  DISCUSSION: 84 year old male presented as code stroke found to have Left MCA CVA. Taken to IR for revascularization. Post-op remained on vent. PCCM asked to see.   ASSESSMENT / PLAN:  PULMONARY A: ASPIRATION PNEUMONIA ENDOTRACHEAL INTUBATION for airway protection. Inability to protect airway in the postoperative setting and now since extubation due to CVA P: s/p "one-way" extubation. No reintubation > this would commit him to long term support. Stop Unasyn after 7 days, 08/28/2017 would be 7 days Status post extubation with rattling cough and inability to clear secretions.  He will most likely continue to aspirate and and there is significant chance of respiratory failure in the near future. He is listed as a full DNR with no trach or PEG therefore is little for pulmonary critical care to offer  of them pulmonary toilet and NTS suctioning  CARDIOVASCULAR A: Hypertension Atrial fibrillation with rapid ventricular response Coronary artery disease Internal carotid artery stenosis P: ICU hemodynamic monitoring Continue Cardizem gtt Return to the intensive care unit for atrial fib slow ventricular response   INFECTIOUS A: ASPIRATION PNEUMONIA P: treated with Unasyn currently on day 7/7 no positive culture data   NEUROLOGIC A:  Acute CVA Left MCA occlusion.  s/p IR revascularization 1/8 P:  Case discussed with Dr. Erlinda Hong   FAMILY  - Updates: updated family at bedside 1/15.. 08/28/2017 no family at bedside.  PCCM will sign off 08/28/2017 as he is DNR.   Richardson Landry Zoua Caporaso  ACNP Maryanna Shape PCCM Pager [phone] till 1 pm If no answer page [phone] 08/28/2017, 8:57 AM

## 2017-08-28 NOTE — Progress Notes (Signed)
Progress Note  Patient Name: Thomas Hanna Date of Encounter: 08/28/2017  Primary Cardiologist: Shelva Majestic, MD   Subjective   Pt on NRB   Not respoonding  Inpatient Medications    Scheduled Meds: . acetylcysteine  3 mL Nebulization TID  . aspirin  300 mg Rectal Daily   Or  . aspirin  325 mg Oral Daily  . chlorhexidine gluconate (MEDLINE KIT)  15 mL Mouth Rinse BID  . dorzolamide-timolol  1 drop Both Eyes BID  . furosemide  40 mg Intravenous Q12H  . glycopyrrolate  0.4 mg Intravenous Q4H  . ipratropium-albuterol  3 mL Nebulization Q6H  . latanoprost  1 drop Both Eyes QHS  . mouth rinse  15 mL Mouth Rinse BID  . pantoprazole sodium  40 mg Per Tube Daily   Continuous Infusions: . diltiazem (CARDIZEM) infusion 15 mg/hr (08/28/17 0900)   PRN Meds: acetaminophen **OR** acetaminophen (TYLENOL) oral liquid 160 mg/5 mL **OR** acetaminophen, albuterol, bisacodyl, labetalol, metoprolol tartrate, metoprolol tartrate, ondansetron, phenol, senna-docusate   Vital Signs    Vitals:   08/28/17 0700 08/28/17 0800 08/28/17 0851 08/28/17 0900  BP: 137/90 138/87  139/86  Pulse: 100 (!) 112  (!) 133  Resp: (!) 34 (!) 27  (!) 34  Temp:  97.9 F (36.6 C)    TempSrc:  Oral    SpO2: 96% 96% 95% 96%  Weight:      Height:        Intake/Output Summary (Last 24 hours) at 08/28/2017 1009 Last data filed at 08/28/2017 0900 Gross per 24 hour  Intake 540.8 ml  Output 750 ml  Net -209.2 ml   Filed Weights   08/26/17 1742 08/27/17 0500 08/28/17 0430  Weight: 188 lb 7.9 oz (85.5 kg) 178 lb 2.1 oz (80.8 kg) 174 lb 13.2 oz (79.3 kg)    Telemetry    Atrial fib  90s  To 120  Personally Reviewed  ECG     Physical Exam   GEN: Pt sitting uprigh Cardiac:  Irreg irreg  , no murmurs, rubs, or gallops.  Respiratory: Rhonchi GI: Soft, nontender, non-distended    MS: Tr to 1+  edema; No deformity. Neuro:  Deferred   Labs    Chemistry Recent Labs  Lab 08/26/17 0820 08/26/17 2257  08/27/17 0306  NA 145 146* 146*  K 2.3* 2.7* 3.2*  CL 104 104 107  CO2 26 25 23   GLUCOSE 134* 115* 124*  BUN 19 19 19   CREATININE 0.74 0.81 0.69  CALCIUM 8.1* 8.2* 8.1*  PROT  --   --  5.6*  ALBUMIN  --   --  2.7*  AST  --   --  39  ALT  --   --  52  ALKPHOS  --   --  66  BILITOT  --   --  1.8*  GFRNONAA >60 >60 >60  GFRAA >60 >60 >60  ANIONGAP 15 17* 16*     Hematology Recent Labs  Lab 08/25/17 0421 08/26/17 0820 08/27/17 0306  WBC 17.3* 21.7* 24.2*  RBC 3.68* 4.06* 4.17*  HGB 10.8* 12.0* 12.0*  HCT 34.1* 37.1* 38.4*  MCV 92.7 91.4 92.1  MCH 29.3 29.6 28.8  MCHC 31.7 32.3 31.3  RDW 14.0 14.0 14.1  PLT 257 285 263    Cardiac EnzymesNo results for input(s): TROPONINI in the last 168 hours.  No results for input(s): TROPIPOC in the last 168 hours.   BNPNo results for input(s): BNP, PROBNP in the last  168 hours.   DDimer No results for input(s): DDIMER in the last 168 hours.   Radiology    Dg Chest Port 1 View  Result Date: 08/26/2017 CLINICAL DATA:  Dyspnea EXAM: PORTABLE CHEST 1 VIEW COMPARISON:  08/24/2017 chest radiograph. FINDINGS: Intact sternotomy wires. CABG clips overlie the mediastinum. Stable cardiomediastinal silhouette with top normal heart size and aortic atherosclerosis. No pneumothorax. Possible trace bilateral pleural effusions. Hazy opacities in left parahilar and bibasilar lungs, not appreciably changed. IMPRESSION: No appreciable change in hazy left parahilar and bibasilar lung opacities, which are nonspecific and could reflect asymmetric mild pulmonary edema, aspiration or pneumonia. Electronically Signed   By: Ilona Sorrel M.D.   On: 08/26/2017 23:47   Dg Abd Portable 1v  Result Date: 08/27/2017 CLINICAL DATA:  Feeding tube placement. EXAM: PORTABLE ABDOMEN - 1 VIEW COMPARISON:  08/21/2017 FINDINGS: Removal of nasogastric tube. Feeding tube terminates at the distal stomach. Cardiomegaly. Nonobstructive bowel-gas pattern. Small amount of contrast  identified about the proximal stomach or transverse colon. Median sternotomy. Left lower lobe atelectasis. IMPRESSION: Feeding tube terminating at the distal stomach. Electronically Signed   By: Abigail Miyamoto M.D.   On: 08/27/2017 15:46   Dg Swallowing Func-speech Pathology  Result Date: 08/27/2017 Objective Swallowing Evaluation: Type of Study: MBS-Modified Barium Swallow Study  Patient Details Name: Thomas Hanna MRN: [id number] Date of Birth: 1923/09/14 Today's Date: 08/27/2017 Time: SLP Start Time (ACUTE ONLY): 1410 -SLP Stop Time (ACUTE ONLY): 1436 SLP Time Calculation (min) (ACUTE ONLY): 26 min Past Medical History: Past Medical History: Diagnosis Date . Coronary artery disease  . MI (myocardial infarction) ([place])  . Orthostatic hypotension 08/2016 . Prostate cancer ([place])  . Stroke [facility])  Past Surgical History: Past Surgical History: Procedure Laterality Date . cardiac stents   . CARDIAC SURGERY   . CORONARY ARTERY BYPASS GRAFT   . IR ANGIO EXTRACRAN SEL COM CAROTID INNOMINATE UNI R MOD SED  08/19/2017 . IR ANGIO VERTEBRAL SEL SUBCLAVIAN INNOMINATE UNI R MOD SED  08/19/2017 . IR PERCUTANEOUS ART THROMBECTOMY/INFUSION INTRACRANIAL INC DIAG ANGIO  08/19/2017 . RADIOLOGY WITH ANESTHESIA N/A 08/18/2017  Procedure: RADIOLOGY WITH ANESTHESIA;  Surgeon: Luanne Bras, MD;  Location: [place];  Service: Radiology;  Laterality: N/A; HPI: 84 y.o.malewith past medical history of left thalamic stroke (January 2018), atrial fib, hypertension, hyperlipidemiaadmitted on1/7/2019with right-sided weakness, left-sided gaze preference, aphasia. He presented to the emergency department as a code stroke and was given IV TPA on 08/18/2016. Additionally, he underwent thrombo-lysis on 08/19/2017. He has been intubated and is in ICU.Palliative care consulted on 1/14 with resulting one way extubation. Pt has struggled with management of secretions overnight with episodes of hypoxia. Initial CXR clear, most recent shows Worsening  bilateral airspace disease which could reflect edema or infection. No prior SLP evaluations documented.  No Data Recorded Assessment / Plan / Recommendation CHL IP CLINICAL IMPRESSIONS 08/27/2017 Clinical Impression Pt demonstrates profound dysphagia with oral and neuromuscular and sensory oral impairment. Pt is globally aphasic and cannot follow commands. Any trials of small sized textrues (solid and liquids) resultin prolonged lingual pumping and absent bolus formation despite use of verbal visual and tactiel cues. Swallow only elicited by increasing bolus size or allowing self feeding, which resulted in spillage to pharynx and aspiration before the swallow with minimal sensation. Cough, once elicited is late and weak, not effective in ejecting large quantity of aspirate. When swallow is triggered there is decreased laryngeal elevation, hyoid excursion and pharyngeal contraction with severe residuals. Cannot recommend diet unless  full comfort measures are initiated. Will continue therapeutic interventions at bedside with education to family. Hopeful for improvement in strength of reflexive cough with more time after extubation, but prognosis poor for return to adequate PO intake.  SLP Visit Diagnosis Dysphagia, oropharyngeal phase (R13.12) Attention and concentration deficit following -- Frontal lobe and executive function deficit following Cerebral infarction Impact on safety and function --   CHL IP TREATMENT RECOMMENDATION 08/26/2017 Treatment Recommendations Therapy as outlined in treatment plan below   No flowsheet data found. CHL IP DIET RECOMMENDATION 08/27/2017 SLP Diet Recommendations NPO;Ice chips PRN after oral care Liquid Administration via -- Medication Administration Via alternative means Compensations -- Postural Changes --   CHL IP OTHER RECOMMENDATIONS 08/27/2017 Recommended Consults -- Oral Care Recommendations Oral care QID Other Recommendations --   CHL IP FOLLOW UP RECOMMENDATIONS 08/27/2017 Follow  up Recommendations Skilled Nursing facility   [facility] IP FREQUENCY AND DURATION 08/27/2017 Speech Therapy Frequency (ACUTE ONLY) min 2x/week Treatment Duration 2 weeks      CHL IP ORAL PHASE 08/27/2017 Oral Phase Impaired Oral - Pudding Teaspoon -- Oral - Pudding Cup -- Oral - Honey Teaspoon Weak lingual manipulation;Lingual pumping;Reduced posterior propulsion;Delayed oral transit;Decreased bolus cohesion;Holding of bolus Oral - Honey Cup -- Oral - Nectar Teaspoon Weak lingual manipulation;Lingual pumping;Reduced posterior propulsion;Delayed oral transit;Decreased bolus cohesion;Holding of bolus Oral - Nectar Cup Weak lingual manipulation;Lingual pumping;Reduced posterior propulsion;Delayed oral transit;Decreased bolus cohesion;Holding of bolus Oral - Nectar Straw -- Oral - Thin Teaspoon -- Oral - Thin Cup -- Oral - Thin Straw -- Oral - Puree Weak lingual manipulation;Lingual pumping;Reduced posterior propulsion;Delayed oral transit;Decreased bolus cohesion;Holding of bolus Oral - Mech Soft -- Oral - Regular -- Oral - Multi-Consistency -- Oral - Pill -- Oral Phase - Comment --  CHL IP PHARYNGEAL PHASE 08/27/2017 Pharyngeal Phase Impaired Pharyngeal- Pudding Teaspoon -- Pharyngeal -- Pharyngeal- Pudding Cup -- Pharyngeal -- Pharyngeal- Honey Teaspoon Delayed swallow initiation-pyriform sinuses;Reduced pharyngeal peristalsis;Reduced epiglottic inversion;Reduced anterior laryngeal mobility;Reduced laryngeal elevation;Reduced airway/laryngeal closure;Reduced tongue base retraction;Penetration/Aspiration before swallow;Pharyngeal residue - valleculae;Pharyngeal residue - pyriform;Significant aspiration (Amount) Pharyngeal Material enters airway, passes BELOW cords and not ejected out despite cough attempt by patient Pharyngeal- Honey Cup -- Pharyngeal -- Pharyngeal- Nectar Teaspoon Delayed swallow initiation-pyriform sinuses;Reduced pharyngeal peristalsis;Reduced epiglottic inversion;Reduced anterior laryngeal mobility;Reduced  laryngeal elevation;Reduced airway/laryngeal closure;Reduced tongue base retraction;Penetration/Aspiration before swallow;Pharyngeal residue - valleculae;Pharyngeal residue - pyriform;Significant aspiration (Amount) Pharyngeal Material enters airway, passes BELOW cords and not ejected out despite cough attempt by patient;Material enters airway, passes BELOW cords without attempt by patient to eject out (silent aspiration) Pharyngeal- Nectar Cup Delayed swallow initiation-pyriform sinuses;Reduced pharyngeal peristalsis;Reduced epiglottic inversion;Reduced anterior laryngeal mobility;Reduced laryngeal elevation;Reduced airway/laryngeal closure;Reduced tongue base retraction;Penetration/Aspiration before swallow;Pharyngeal residue - valleculae;Pharyngeal residue - pyriform;Significant aspiration (Amount) Pharyngeal Material enters airway, passes BELOW cords without attempt by patient to eject out (silent aspiration);Material enters airway, passes BELOW cords and not ejected out despite cough attempt by patient Pharyngeal- Nectar Straw -- Pharyngeal -- Pharyngeal- Thin Teaspoon -- Pharyngeal -- Pharyngeal- Thin Cup -- Pharyngeal -- Pharyngeal- Thin Straw -- Pharyngeal -- Pharyngeal- Puree Other (Comment) Pharyngeal -- Pharyngeal- Mechanical Soft -- Pharyngeal -- Pharyngeal- Regular -- Pharyngeal -- Pharyngeal- Multi-consistency -- Pharyngeal -- Pharyngeal- Pill -- Pharyngeal -- Pharyngeal Comment --  No flowsheet data found. No flowsheet data found. [name], [place] [phone] Lynann Beaver 08/27/2017, 2:55 PM               Cardiac Studies     Patient Profile  Assessment & Plan    Pt's clinical status  declined overnight  Failed swallow study yesterday with some aspiration. Now not alert/awake  Lungs with diffuse rhonchi Palliative care has also seen pt   Plan for comfort measures Agree with plans   Will be available as needed.      For questions or updates, please contact  [place] Please consult [email or web address] for contact info under Cardiology/STEMI.      Signed, Dorris Carnes, MD

## 2017-08-28 NOTE — Progress Notes (Signed)
SLP Cancellation Note  Patient Details Name: Thomas Hanna MRN: [id number] DOB: 1924/05/15   Cancelled treatment:       Reason Eval/Treat Not Completed: Medical issues which prohibited therapy. Pts respiratory function is declining. If family has questions for SLP I can come by later.   [name], [place] CCC-SLP [phone]  Lynann Beaver 08/28/2017, 10:35 AM

## 2017-08-28 NOTE — Progress Notes (Signed)
Met with son and daughter-in-law. Transition to comfort measures. Updated Dr. Erlinda Hong and RN. Full note to follow.   NO CHARGE  Ihor Dow, FNP-C Palliative Medicine Team  Phone: [phone] Fax: [phone]

## 2017-08-28 NOTE — Progress Notes (Signed)
OT Cancellation Note  Patient Details Name: Saiquan Hands MRN: [id number] DOB: 02-12-1924   Cancelled Treatment:    Reason Eval/Treat Not Completed: Other (comment). Discussed with nursing. Nursing to discuss with palliative medicine and Dr. Erlinda Hong to help determine if pt is appropriate to be seen today. Will await call from nursing before we proceed.  [facility], OT/L  [phone] 08/28/2017  Diella Gillingham,HILLARY 08/28/2017, 9:03 AM

## 2017-08-28 NOTE — Progress Notes (Signed)
PT Cancellation Note/ Discharge  Patient Details Name: Thomas Hanna MRN: [id number] DOB: 12-15-1923   Cancelled Treatment:    Reason Eval/Treat Not Completed: Medical issues which prohibited therapy(pt with continued HR and secretion management issues and per Dr.Xu no longer appropriate for therapy at this time. will sign off. Please reorder as appropriate)   [name] [name] 08/28/2017, 11:46 AM  Elwyn Reach, [place]

## 2017-08-29 DIAGNOSIS — Z931 Gastrostomy status: Secondary | ICD-10-CM

## 2017-08-29 MED ORDER — SODIUM CHLORIDE 0.9 % IV SOLN
1.0000 mg/h | INTRAVENOUS | Status: DC
  Administered 2017-08-29: 1 mg/h via INTRAVENOUS
  Filled 2017-08-29: qty 10

## 2017-08-29 MED ORDER — MORPHINE SULFATE (PF) 4 MG/ML IV SOLN
2.0000 mg | INTRAVENOUS | Status: DC
Start: 2017-08-29 — End: 2017-08-29
  Filled 2017-08-29: qty 1

## 2017-08-29 MED ORDER — MORPHINE BOLUS VIA INFUSION
2.0000 mg | INTRAVENOUS | Status: DC | PRN
  Administered 2017-08-29: 2 mg via INTRAVENOUS
  Filled 2017-08-29: qty 2

## 2017-08-29 NOTE — Progress Notes (Addendum)
NEUROHOSPITALISTS STROKE TEAM - DAILY PROGRESS NOTE   SUBJECTIVE (INTERVAL HISTORY) Son and granddaughter are at the bedside. Pt still on SCDs and non-rebreahter. Son stated that he put the SCDs on for DVT prophylaxis. I told him that we need to keep comfort. Will take SCDs off. Discussed with palliative care Sarah, will put on morphine drip and off non-rebreather. Residential hospice initially will admit him this pm but now postponed to tomorrow morning. He already has a bed in residential hospice.      OBJECTIVE Lab Results: CBC:  Recent Labs  Lab 08/25/17 0421 08/26/17 0820 08/27/17 0306  WBC 17.3* 21.7* 24.2*  HGB 10.8* 12.0* 12.0*  HCT 34.1* 37.1* 38.4*  MCV 92.7 91.4 92.1  PLT 257 285 263   BMP: Recent Labs  Lab 08/24/17 0344 08/25/17 0421 08/26/17 0820 08/26/17 2257 08/27/17 0306  NA 143 145 145 146* 146*  K 3.1* 3.4* 2.3* 2.7* 3.2*  CL 112* 113* 104 104 107  CO2 20* 22 26 25 23   GLUCOSE 134* 123* 134* 115* 124*  BUN 20 21* 19 19 19   CREATININE 0.56* 0.60* 0.74 0.81 0.69  CALCIUM 8.2* 8.0* 8.1* 8.2* 8.1*  MG 2.1 2.2 1.9 1.9 1.9  PHOS 1.9* 2.3* 3.1  --  3.0   PHYSICAL EXAM Temp:  [97.6 F (36.4 C)-98.9 F (37.2 C)] 98.9 F (37.2 C) (01/17 2206) Pulse Rate:  [87-133] 119 (01/17 2206) Resp:  [27-38] 31 (01/17 2000) BP: (115-139)/(68-102) 122/84 (01/17 2206) SpO2:  [94 %-100 %] 95 % (01/17 2000) Weight:  [182 lb 12.2 oz (82.9 kg)] 182 lb 12.2 oz (82.9 kg) (01/17 2206) General - Well nourished, well developed, in no apparent distress, Intubated and sedated HEENT-  Normocephalic,   Cardiovascular - Iregular rate and rhythm  Respiratory -  No wheezing. Abdomen - soft and non-tender, Extremities- 1+ edema NEURO:  Mental Status: Awake, mostly non-verbal but interactive with examiner, holding hand and making hand gestures. Following only very simple commands, Right side neglect Cranial Nerves: PERRL gaze  preference to the left, slight hypertropia of both eyes unable to cross the midline.  No blink to threat from right site, right lower facial weakness. Motor: 0/5 right upper extremity, minimal withdrawal to noxious stimulus in the right lower extremity, 4/5 left upper extremity, 3+ LLE. Tone: is decreased and bulk is normal DTR 1+ and no babinski Sensation, coordination and gait not tested.  IMAGING: I have personally reviewed the radiological images below and agree with the radiology interpretations.  Ct Head Wo Contrast Result Date: 08/19/2017 IMPRESSION: Loss of gray-white differentiation within the insula and frontal operculum compatible with evolving acute infarction. No additional area of infarction identified at this time. No intracranial hemorrhage or mass effect.  CTA Head/Neck and Cerebral Perfusion W Contrast Result Date: 08/18/2017 IMPRESSION: CTA neck: 1. Patent carotid and vertebral arteries of the neck. 2. Dense calcified plaque of bilateral carotid bifurcations with proximal ICA moderate 60-70% stenosis. 3. Moderate stenosis of right vertebral artery origin. CTA head: 1. Distal left M1 occlusion extending into left M2 superior division. Early reconstitution of left M2 inferior division via collateralization. Poor downstream collateralization and left superior MCA distribution. 2. Left paraclinoid ICA severe stenosis. 3. Right paraclinoid and terminal ICA occlusion or near occlusion. Contrast opacification of the right upper cervical ICA diminishes gradually to the terminal segment, severe stenosis was present on the prior study. Findings may represent chronic severe stenosis with poor antegrade flow or interval thrombosis. CT brain perfusion: 1. Core  infarct volume of 71 cc. 2. Mismatch ischemic penumbra of 102 cc. 3. Infarct location left superior MCA distribution.   Portable Chest Xray Result Date: 08/24/2017 IMPRESSION: Worsening bilateral airspace disease which could reflect edema  or Infection  Portable Chest Xray Result Date: 08/22/2017 IMPRESSION: Stable hazy bilateral airspace disease  Portable Chest Xray Result Date: 08/19/2017 IMPRESSION: Endotracheal tube 4.8 cm from carina. Aortic atherosclerosis. No acute pulmonary process identified.   Ct Head Code Stroke Wo Contrast Result Date: 08/18/2017 IMPRESSION: 1. No acute stroke, hemorrhage, or mass effect identified. 2. Density in left M2 superior division, possible thrombus. 3. ASPECTS is 10 4. Stable chronic microvascular ischemic changes and parenchymal volume loss of the brain.   Echocardiogram:                                               Study Conclusions - Left ventricle: Posterior basal hypokinesis. Wall thickness was   increased in a pattern of mild LVH. The estimated ejection   fraction was 55%. The study is not technically sufficient to   allow evaluation of LV diastolic function. - Mitral valve: Moderately calcified annulus. Moderately thickened,   moderately calcified leaflets . There was mild regurgitation. - Left atrium: The atrium was mildly dilated. - Atrial septum: No defect or patent foramen ovale was identified.  MRI BRAIN:  1. Patchy multifocal acute ischemic infarcts involving the bilateral cerebral hemispheres, with additional 1 cm left cerebellar infarct as above. Associated petechial hemorrhage at the left frontal operculum without hemorrhagic transformation. 2. Underlying mild chronic microvascular ischemic disease.  LE venous doppler - negative for DVT      IMPRESSION: [name] is a 84 y.o. male with PMH  of CAD, prostate cancer, left thalamic stroke with right-sided weakness in 08/2016 admitted for right-sided weakness, left gaze and aphasia.  Status post TPA.  CT head and neck showed left M1 occlusion, bilateral ICA proximal 60-70% stenosis.  Right terminal ICA occlusion or near occlusion.  Had mechanical thrombectomy with TICI2b reperfusion.  Found to have A. fib  with RVR, put on Cardizem IV.  MRI showed left cerebellum, left MCA, ACA and right MCA patchy moderate sized infarcts.  Stroke consistent with embolic stroke due to newly diagnosed A. fib.  EF 55%, LDL 87 and A1c 5.7.     Acute ischemic strokes in multiple vascular distributions status post IV TPA and mechanical thrombectomy  Suspected Etiology: likely cardio embolic given new onset AFIB   Resultant Symptoms:  right-sided weakness, left-sided gaze preference and a aphasia. Stroke Risk Factors: hyperlipidemia and hypertension, AFIB Other Stroke Risk Factors: Advanced age, Hx stroke, CAD, Prostate Cancer, Hx of MI  PROCEDURES:  08/19/2017  Dr Estanislado Pandy S/P Lt common carotid arteriogram followed by revascularization of Lt MCA distal M1 and mid perisylvian M3 branch  with mechanical thrombolysis achieving a TICI 2 b reperfusion.  ASSESSMENT:   Patient extubated overnight. AFIB with RVR and Episode of hypoxia overnight. To be transferred back to ICU for Cardizem drip. Awake, mostly non-verbal. Following some very basic commands. Moving LUE spontaneously and occasionally LLE. No spontaneous movement of Right side. +withdrawal to pain. Family at bedside, hopeful he will start to improve.  PLAN  Continue ASA, for now Frequent neuro checks Telemetry monitoring PT/OT/SLP Consult PM & Rehab Consult Case Management /MSW Ongoing aggressive stroke risk factor management Patient will be  counseled to be compliant with his antithrombotic medications Patient will be counseled on Lifestyle modifications including, Diet, Exercise, and Stress Follow up with [facility] in 6 weeks  HX OF STROKES: Left thalamic stroke with mild residual right hemiparesis since January 2018  INTRACRANIAL Atherosclerosis &Stenosis: ASA started for now  DYSPHAGIA: Failed swallow, had cortrak Aspiration Precautions in progress Robinul in progress  MEDICAL ISSUES: CCM following, Appreciate  Assistance  Inability to protect airway in the postoperative setting Some Hypoxia overnight, now on face mask SOB Family in agreement that they will not re-intubate at this time  Aspiration Pneumonia Unasyn and Vanco in progress Will stop antibiotics after 7 day course, O2 increased, respiratory following, Proventil & Duonebs & Pulmonary Toilet in progress  Acute Diastolic CHF Cardiology following IV Lasix started yesterday, +12.5L +Diuresing, Foley in place   AFIB with RVR, NEW ONSET:  A. Fib with RVR overnight, Metoprolol IV given Cardiology to have patient transferred back to ICU for Cardizem drip Cardiology Consultation, Appreciate assistance Continue to HOLD [facility] - for now Will need long-term anticoagulation once safe from a stroke standpoint, if appropriate  Leukocytosis Remains afebrile Will obtain chest CXR and U/A Will continue to monitor closely Repeat labs in AM  Hypokalemia IV Replacement in progress Repeat labs in AM  Hypocalcemia Will monitor trend for now Repeat labs in AM  Mild anemia, Likely chronic at baseline  Hgb 12.0 / Hct 37.1 - trending up Repeat labs in AM  HYPERTENSION: Stable overnight PRN Labetalol Change blood pressure goal below 110 -180.  Norepinephrine drip Discontinued Long term BP goal normotensive. Home Meds: NONE  HYPERLIPIDEMIA:    Component Value Date/Time   CHOL 140 08/19/2017 0304   TRIG 86 08/19/2017 0304   HDL 36 (L) 08/19/2017 0304   CHOLHDL 3.9 08/19/2017 0304   VLDL 17 08/19/2017 0304   LDLCALC 87 08/19/2017 0304  Home Meds:  Lovaza LDL  goal < 70 Will continued Lovaza and start Lipitor to 10 mg daily, if passes SLP evaluation Continue statin at discharge, if appropriate  R/O DIABETES: Lab Results  Component Value Date   HGBA1C 5.7 (H) 08/19/2017  HgbA1c goal < 7.0  Other Active Problems: Principal Problem:   Acute ischemic left MCA stroke (HCC) Active Problems:   Coronary artery disease due to lipid  rich plaque   Normocytic anemia   Coronary artery disease involving coronary bypass graft of native heart without angina pectoris   Pressure injury of skin   Atrial fibrillation with RVR (HCC)   Bilateral carotid artery stenosis   Aspiration pneumonia (HCC)   Ileus (HCC)   Endotracheally intubated   Hypokalemia   Hypertension   Palliative care by specialist   Goals of care, counseling/discussion   Paroxysmal atrial fibrillation ([place])   At high risk for aspiration   Shortness of breath   Right hemiplegia (HCC)   Aphasia   Increased oropharyngeal secretions   Terminal care    Hospital day # 11 VTE prophylaxis: SCD's  Diet : No diet orders on file , will need to address NGT with family BM: Last documented 08/24/2017, Bowel meds in progress  FAMILY UPDATES: family at bedside  TEAM UPDATES: Rosalin Hawking, MD STATUS:  DNR, Palliative following, Appreciate Assistance. Will transition to comfort care if he declines. Watchful waiting.   Prior Home Stroke Medications:  clopidogrel 75 mg daily  Discharge Stroke Meds:  Please discharge patient on TBD   Disposition: 01-Home or Self Care Therapy Recs:  PENDING Home Equipment:         PENDING Follow Up:  Follow-up Information    Garvin Fila, MD. Schedule an appointment as soon as possible for a visit in 6 week(s).   Specialties:  Neurology, Radiology Contact information: [address] [place] [address] [place] Not In -PCP Follow up in 1-2 weeks   Case Management aware of need      ATTENDING NOTE: I have personally examined this patient, reviewed notes, independently viewed imaging studies, participated in medical decision making and plan of care. I have made any additions or clarifications directly to the above note.   84 year old male with history of CAD, prostate cancer, left thalamic stroke with right-sided weakness in 08/2016 admitted for right-sided weakness,  left gaze and aphasia.  Status post TPA.  CT head and neck showed left M1 occlusion, bilateral ICA proximal 60-70% stenosis.  Right terminal ICA occlusion or near occlusion.  Had mechanical thrombectomy with TICI2b reperfusion.  Found to have A. fib with RVR, put on Cardizem IV.  MRI showed left cerebellum, left MCA, ACA and right MCA patchy moderate sized infarcts.  Stroke consistent with embolic stroke due to newly diagnosed A. fib.  EF 55%, LDL 87 and A1c 5.7.    Patient extubated on 08/25/17 and transferred to floor, but developed afib RVR and hypoxia over night. Transfer back to ICU, and restarted cardizem IV. Failed swallow and put on cortrak. However,worsening respiratory issue, put on non-rebreather and then face mask, SOB, tachypenia, tachycardia, maxed on Cardizem. Copious secretion. DVT negative. WBC significantly elevated, UA neg for UTI. Pt is DNR/DNI. Due to poor prognosis, discussed with son and palliative care and now pt in comfort care. Residential hospice can not take pt today but will be ready for him tomorrow morning. Will put him on morphine drip.  Rosalin Hawking, MD PhD Stroke Neurology 08/29/2017 3:17 PM  To contact Stroke Continuity provider, please refer to [email or web address] After hours, contact General Neurology

## 2017-08-29 NOTE — Progress Notes (Addendum)
Daily Progress Note   Patient Name: Thomas Hanna       Date: 08/29/2017 DOB: 1924-03-20  Age: 84 y.o. MRN#: [id number] Attending Physician: Rosalin Hawking, MD Primary Care Physician: System, Pcp Not In Admit Date: 08/18/2017  Reason for Consultation/Follow-up: Establishing goals of care, Non pain symptom management, Pain control, Psychosocial/spiritual support and Terminal Care  Subjective: Chart reviewed. Pt seen. No family at bedside.Pt opens his eyes to voice. Can shake his head yes/nio. RR 30/min. Endorsing feeling SHOB as well as pain. Wearing NRB  Length of Stay: 11  Current Medications: Scheduled Meds:  . furosemide  40 mg Intravenous Q12H  . glycopyrrolate  0.4 mg Intravenous Q4H  . metoprolol tartrate  5 mg Intravenous QID  .  morphine injection  2 mg Intravenous Q4H  . scopolamine  1 patch Transdermal Q72H    Continuous Infusions:   PRN Meds: acetaminophen **OR** acetaminophen (TYLENOL) oral liquid 160 mg/5 mL **OR** acetaminophen, bisacodyl, glycopyrrolate, labetalol, LORazepam, morphine injection, ondansetron  Physical Exam  Constitutional:  Acutely ill appearing elderly man. Opens eyes to voice. Shakes his head yes/ to [facility] as well as pain  HENT:  Head: Normocephalic and atraumatic.  Neck: Normal range of motion.  Cardiovascular:  Tachy, irrg  Pulmonary/Chest:  Increased work of breathing. Shakes his head "yes" to subjectively feeling SHOB  Genitourinary:  Genitourinary Comments: foley  Musculoskeletal: Normal range of motion.  Neurological:  Opens his eyes to voice  Skin: Skin is warm and dry.  No mottling  Psychiatric:  Affect anxious  Nursing note and vitals reviewed.           Vital Signs: BP 122/84 (BP Location: Right Arm)   Pulse (!) 119   Temp  98.9 F (37.2 C) (Oral)   Resp (!) 31   Ht 5\' 11"  (1.803 m)   Wt 82.9 kg (182 lb 12.2 oz)   SpO2 95%   BMI 25.49 kg/m  SpO2: SpO2: 95 % O2 Device: O2 Device: NRB O2 Flow Rate: O2 Flow Rate (L/min): 10 L/min  Intake/output summary:   Intake/Output Summary (Last 24 hours) at 08/29/2017 1011 Last data filed at 08/28/2017 1800 Gross per 24 hour  Intake 101 ml  Output 1000 ml  Net -899 ml   LBM: Last BM Date: 08/24/17 Baseline Weight: Weight: 82.9 kg (182 lb  12.2 oz) Most recent weight: Weight: 82.9 kg (182 lb 12.2 oz)       Palliative Assessment/Data:    Flowsheet Rows     Most Recent Value  Intake Tab  Referral Department  Critical care  Unit at Time of Referral  ICU  Palliative Care Primary Diagnosis  Neurology  Date Notified  08/24/17  Palliative Care Type  New Palliative care  Reason for referral  Clarify Goals of Care  Date of Admission  08/18/17  Date first seen by Palliative Care  08/24/17  # of days Palliative referral response time  0 Day(s)  # of days IP prior to Palliative referral  6  Clinical Assessment  Palliative Performance Scale Score  30%  Pain Max last 24 hours  Not able to report  Pain Min Last 24 hours  Not able to report  Dyspnea Max Last 24 Hours  Not able to report  Dyspnea Min Last 24 hours  Not able to report  Nausea Max Last 24 Hours  Not able to report  Nausea Min Last 24 Hours  Not able to report  Anxiety Max Last 24 Hours  Not able to report  Anxiety Min Last 24 Hours  Not able to report  Other Max Last 24 Hours  Not able to report  Psychosocial & Spiritual Assessment  Palliative Care Outcomes  Patient/Family meeting held?  Yes  Who was at the meeting?  son and DIL  Patient/Family wishes: Interventions discontinued/not started   Lurline Idol, [facility]  Palliative Care follow-up planned  Yes, Facility      Patient Active Problem List   Diagnosis Date Noted  . Increased oropharyngeal secretions   . Terminal care   . At high risk for  aspiration   . Shortness of breath   . Right hemiplegia ([place])   . Aphasia   . Goals of care, counseling/discussion   . Paroxysmal atrial fibrillation (HCC)   . Palliative care by specialist   . Hypokalemia 08/22/2017  . Hypertension 08/22/2017  . Aspiration pneumonia ([place]) 08/21/2017  . Ileus ([place]) 08/21/2017  . Endotracheally intubated   . Bilateral carotid artery stenosis 08/20/2017    Class: Chronic  . Pressure injury of skin 08/19/2017  . Atrial fibrillation with RVR ([place])   . Acute ischemic left MCA stroke ([place]) 08/18/2017  . Cervical paraspinal muscle spasm 02/18/2017  . MCI (mild cognitive impairment) 10/22/2016  . Orthostatic hypertension 09/13/2016  . Coronary artery disease involving coronary bypass graft of native heart without angina pectoris 09/13/2016  . Abnormal MRI of head 09/13/2016  . Orthostatic hypotension 09/03/2016  . CAD S/P percutaneous coronary angioplasty 09/03/2016  . Hx of CABG 09/03/2016  . Dyslipidemia 09/03/2016  . Coronary artery disease due to lipid rich plaque 08/29/2016  . Hyponatremia 08/29/2016  . Elevated LFTs 08/29/2016  . Normocytic anemia 08/29/2016  . Syncope 08/28/2016    Palliative Care Assessment & Plan   Patient Profile: 84 y.o.malewith past medical history of left thalamic stroke (January 2018), atrial fib, hypertension, hyperlipidemiaadmitted on1/7/2019with right-sided weakness, left-sided gaze preference, aphasia. Last known well at 1900 on 08/18/2017. He presented to the emergency department as a code stroke and was given IV TPA on 08/18/2016. Additionally, he underwent thrombo-lysis on 08/19/2017. He has been intubated and is in ICU. Extubated 1/14.  Consult ordered for goals of care. Now goals are primarily focused on comfort   Recommendations/Plan:  Dyspnea: Needs improvement.Increase scheduled MS04 in response to tachypnea and pt endorsing feeling SHOB to MS04  2mg  q4 ATC. Monitor for need for continuous  infusion  Secretions: Improving. Cont with sch robinul 0.4 q4 ATC as well as scop TD  Anxiety: No PRN's ativan given overnight. Cont to monitor for need for sch dosing  Goals of Care and Additional Recommendations:  Limitations on Scope of Treatment: Avoid Hospitalization, Minimize Medications, Initiate Comfort Feeding, No Artificial Feeding, No Blood Transfusions, No Chemotherapy, No Diagnostics, No Glucose Monitoring, No Hemodialysis, No IV Antibiotics, No Lab Draws, No Radiation, No Surgical Procedures and No Tracheostomy  Code Status:    Code Status Orders  (From admission, onward)        Start     Ordered   08/20/17 1411  Do not attempt resuscitation (DNR)  Continuous    Question Answer Comment  In the event of cardiac or respiratory ARREST Do not call a "code blue"   In the event of cardiac or respiratory ARREST Do not perform Intubation, CPR, defibrillation or ACLS   In the event of cardiac or respiratory ARREST Use medication by any route, position, wound care, and other measures to relive pain and suffering. May use oxygen, suction and manual treatment of airway obstruction as needed for comfort.      08/20/17 1410    Code Status History    Date Active Date Inactive Code Status Order ID Comments User Context   08/19/2017 02:18 08/20/2017 14:10 Full Code [id number]  Luanne Bras, MD Inpatient   08/18/2017 22:37 08/19/2017 02:18 Full Code [id number]  Amie Portland, MD ED   09/03/2016 16:05 09/09/2016 19:20 Full Code [id number]  Erlene Quan, PA-C ED   08/29/2016 00:39 08/29/2016 16:48 Full Code [id number]  Edwin Dada, MD Inpatient       Prognosis:   < 2 weeks in the setting of acute CVA, dysphagia. High risk for an acute event such as aspirative, reoccurrence CVA  Discharge Planning:  To Be Determined   Addendum 1500: Spoke to son, [name]. Wants residential hospice. Offerd choice per medicare guidelines. Want [facility]. [facility] hospital liaison, Webb Silversmith, who is coming by this afternoon and hopefully transferring this afternoon. Notified Dr. Erlinda Hong of plan as well as need for DC summary. SW order placed.    Thank you for allowing the Palliative Medicine Team to assist in the care of this patient.   Time In: 0930 Time Out: 0955 Total Time 25 min Prolonged Time Billed  no       Greater than 50%  of this time was spent counseling and coordinating care related to the above assessment and plan.  Dory Horn, NP  Please contact Palliative Medicine Team phone at [phone] for questions and concerns.

## 2017-08-29 NOTE — Social Work (Signed)
CSW acknowledging consult for residential hospice placement at [facility]. CSW aware pt has bed but will transfer tomorrow.   Medical necessity form, facesheet left on chart, Melanie from PMT will place DNR on chart.   Handoff left for weekend CSW.   Social Work continuing to follow to support transfer to residential hospice.   [place], [place] Work [phone]

## 2017-08-29 NOTE — Progress Notes (Signed)
Nutrition Brief Note  Chart reviewed. Pt now transitioning to comfort care.  No further nutrition interventions warranted at this time.  Please re-consult as needed.   Rogelio Waynick A. Tayler Heiden, RD, LDN, CDE Pager: [phone] After hours Pager: [phone]  

## 2017-08-30 DIAGNOSIS — I6300 Cerebral infarction due to thrombosis of unspecified precerebral artery: Secondary | ICD-10-CM

## 2017-08-30 MED ORDER — SCOPOLAMINE 1 MG/3DAYS TD PT72: 1.0000 | MEDICATED_PATCH | TRANSDERMAL | 12 refills | Status: AC

## 2017-08-30 MED ORDER — GLYCOPYRROLATE 0.2 MG/ML IJ SOLN: 0.2000 mg | INTRAMUSCULAR | Status: AC | PRN

## 2017-08-30 MED ORDER — MORPHINE BOLUS VIA INFUSION: 2.0000 mg | INTRAVENOUS | 0 refills | Status: AC | PRN

## 2017-08-30 MED ORDER — ACETAMINOPHEN 650 MG RE SUPP: 650.0000 mg | RECTAL | 0 refills | Status: AC | PRN

## 2017-08-30 MED ORDER — GLYCOPYRROLATE 0.2 MG/ML IJ SOLN
0.2000 mg | INTRAMUSCULAR | Status: DC
  Administered 2017-08-30 (×2): 0.2 mg via INTRAVENOUS
  Filled 2017-08-30 (×2): qty 1

## 2017-08-30 MED ORDER — LORAZEPAM 2 MG/ML IJ SOLN: 1.0000 mg | INTRAMUSCULAR | 0 refills | Status: AC | PRN

## 2017-08-30 MED ORDER — ONDANSETRON HCL 4 MG/2ML IJ SOLN: 4.0000 mg | Freq: Four times a day (QID) | INTRAMUSCULAR | 0 refills | Status: AC | PRN

## 2017-08-30 NOTE — Clinical Social Work Note (Addendum)
Pt will transfer to [facility] today. Thomas Hanna is on the way to the hospital to do paperwork with son. CSW will set up transport prior to the completion of the paperwork. In the mean time RN please call report to  [phone].    [place], [place]

## 2017-08-30 NOTE — Clinical Social Work Note (Signed)
Clinical Social Worker facilitated patient discharge including contacting patient family and facility to confirm patient discharge plans.  Clinical information faxed to facility and family agreeable with plan.  CSW arranged ambulance transport via [facility] to [facility] in [place].  RN to call for report prior to discharge.  Clinical Social Worker will sign off for now as social work intervention is no longer needed. Please consult [place] again if new need arises.  [place], [place]

## 2017-08-30 NOTE — Discharge Summary (Signed)
Stroke Discharge Summary  Patient ID: Thomas Hanna   MRN: [id number]      DOB: [birth year]  Date of Admission: 08/18/2017 Date of Discharge: 08/30/2017  Attending Physician:  Rosalin Hawking, MD, Stroke MD Consultant(s):    Palliative Care Dr Rowe Pavy ; Cardiology Dr Oval Linsey ; Critical Care Dr. Jimmey Ralph Patient's PCP:  System, Pcp Not In  DISCHARGE DIAGNOSIS: Multiple cerebral infarcts status post IV TPA and mechanical thrombectomy but with poor clinical outcome. Etiology of stroke likely new onset atrial fibrillation patient made DO NOT RESUSCITATE and comfort care Principal Problem:   Stroke (cerebrum) ([place]) Active Problems:   Coronary artery disease due to lipid rich plaque   Normocytic anemia   Coronary artery disease involving coronary bypass graft of native heart without angina pectoris   Pressure injury of skin   Atrial fibrillation with RVR (HCC)   Bilateral carotid artery stenosis   Aspiration pneumonia (HCC)   Ileus (HCC)   Endotracheally intubated   Hypokalemia   Hypertension   Palliative care by specialist   Goals of care, counseling/discussion   Paroxysmal atrial fibrillation ([place])   At high risk for aspiration   Shortness of breath   Right hemiplegia (HCC)   Aphasia   Increased oropharyngeal secretions   Terminal care   S/P percutaneous endoscopic gastrostomy (PEG) tube placement [facility])   Past Medical History:  Diagnosis Date  . Coronary artery disease   . MI (myocardial infarction) ([place])   . Orthostatic hypotension 08/2016  . Prostate cancer ([place])   . Stroke [facility])    Past Surgical History:  Procedure Laterality Date  . cardiac stents    . CARDIAC SURGERY    . CORONARY ARTERY BYPASS GRAFT    . IR ANGIO EXTRACRAN SEL COM CAROTID INNOMINATE UNI R MOD SED  08/19/2017  . IR ANGIO VERTEBRAL SEL SUBCLAVIAN INNOMINATE UNI R MOD SED  08/19/2017  . IR PERCUTANEOUS ART THROMBECTOMY/INFUSION INTRACRANIAL INC DIAG ANGIO  08/19/2017  . RADIOLOGY WITH ANESTHESIA N/A  08/18/2017   Procedure: RADIOLOGY WITH ANESTHESIA;  Surgeon: Luanne Bras, MD;  Location: [place];  Service: Radiology;  Laterality: N/A;    Allergies as of 08/30/2017      Reactions   Sulfa Antibiotics Other (See Comments)   Unknown      Medication List    STOP taking these medications   cholecalciferol 1000 units tablet Commonly known as:  VITAMIN D   clopidogrel 75 MG tablet Commonly known as:  PLAVIX   dorzolamide 2 % ophthalmic solution Commonly known as:  TRUSOPT   ferrous sulfate 325 (65 FE) MG tablet   folic acid 502 MCG tablet Commonly known as:  FOLVITE   gabapentin 100 MG capsule Commonly known as:  NEURONTIN   GINKOBA PO   latanoprost 0.005 % ophthalmic solution Commonly known as:  XALATAN   midodrine 5 MG tablet Commonly known as:  PROAMATINE   omega-3 acid ethyl esters 1 g capsule Commonly known as:  LOVAZA   tamsulosin 0.4 MG Caps capsule Commonly known as:  FLOMAX   vitamin B-12 100 MCG tablet Commonly known as:  CYANOCOBALAMIN   vitamin C 100 MG tablet     TAKE these medications   acetaminophen 650 MG suppository Commonly known as:  TYLENOL Place 1 suppository (650 mg total) rectally every 4 (four) hours as needed for mild pain (or temp > 37.5 C (99.5 F)).   glycopyrrolate 0.2 MG/ML injection Commonly known as:  ROBINUL Inject 1 mL (0.2  mg total) into the vein every 4 (four) hours as needed (secretions).   LORazepam 2 MG/ML injection Commonly known as:  ATIVAN Inject 0.5 mLs (1 mg total) into the vein every 4 (four) hours as needed for anxiety.   morphine 5 mg/mL Soln Inject 2 mg into the vein every 30 (thirty) minutes as needed (dyspnea).   ondansetron 4 MG/2ML Soln injection Commonly known as:  ZOFRAN Inject 2 mLs (4 mg total) into the vein every 6 (six) hours as needed for nausea or vomiting.   scopolamine 1 MG/3DAYS Commonly known as:  TRANSDERM-SCOP Place 1 patch (1.5 mg total) onto the skin every 3 (three) days. Start  taking on:  09/20/17       LABORATORY STUDIES CBC    Component Value Date/Time   WBC 24.2 (H) 08/27/2017 0306   RBC 4.17 (L) 08/27/2017 0306   HGB 12.0 (L) 08/27/2017 0306   HCT 38.4 (L) 08/27/2017 0306   PLT 263 08/27/2017 0306   MCV 92.1 08/27/2017 0306   MCH 28.8 08/27/2017 0306   MCHC 31.3 08/27/2017 0306   RDW 14.1 08/27/2017 0306   LYMPHSABS 1.7 08/27/2017 0306   MONOABS 2.1 (H) 08/27/2017 0306   EOSABS 0.0 08/27/2017 0306   BASOSABS 0.0 08/27/2017 0306   CMP    Component Value Date/Time   NA 146 (H) 08/27/2017 0306   K 3.2 (L) 08/27/2017 0306   CL 107 08/27/2017 0306   CO2 23 08/27/2017 0306   GLUCOSE 124 (H) 08/27/2017 0306   BUN 19 08/27/2017 0306   CREATININE 0.69 08/27/2017 0306   CREATININE 0.85 03/26/2017 0902   CALCIUM 8.1 (L) 08/27/2017 0306   PROT 5.6 (L) 08/27/2017 0306   ALBUMIN 2.7 (L) 08/27/2017 0306   AST 39 08/27/2017 0306   ALT 52 08/27/2017 0306   ALKPHOS 66 08/27/2017 0306   BILITOT 1.8 (H) 08/27/2017 0306   GFRNONAA >60 08/27/2017 0306   GFRNONAA 76 03/26/2017 0902   GFRAA >60 08/27/2017 0306   GFRAA 88 03/26/2017 0902   COAGS Lab Results  Component Value Date   INR 1.04 08/18/2017   INR 1.02 09/03/2016   INR 0.95 08/28/2016   Lipid Panel    Component Value Date/Time   CHOL 140 08/19/2017 0304   TRIG 86 08/19/2017 0304   HDL 36 (L) 08/19/2017 0304   CHOLHDL 3.9 08/19/2017 0304   VLDL 17 08/19/2017 0304   LDLCALC 87 08/19/2017 0304   HgbA1C  Lab Results  Component Value Date   HGBA1C 5.7 (H) 08/19/2017   Urinalysis    Component Value Date/Time   COLORURINE YELLOW 08/27/2017 1657   APPEARANCEUR CLEAR 08/27/2017 1657   LABSPEC 1.020 08/27/2017 1657   PHURINE 7.0 08/27/2017 1657   GLUCOSEU NEGATIVE 08/27/2017 1657   HGBUR MODERATE (A) 08/27/2017 1657   BILIRUBINUR NEGATIVE 08/27/2017 1657   KETONESUR 5 (A) 08/27/2017 1657   PROTEINUR 30 (A) 08/27/2017 1657   NITRITE NEGATIVE 08/27/2017 1657   LEUKOCYTESUR NEGATIVE  08/27/2017 1657   Urine Drug Screen     Component Value Date/Time   LABOPIA NONE DETECTED 08/28/2016 2314   COCAINSCRNUR NONE DETECTED 08/28/2016 2314   LABBENZ NONE DETECTED 08/28/2016 2314   AMPHETMU NONE DETECTED 08/28/2016 2314   THCU NONE DETECTED 08/28/2016 2314   LABBARB NONE DETECTED 08/28/2016 2314    Alcohol Level    Component Value Date/Time   ETH <5 08/28/2016 2045     SIGNIFICANT DIAGNOSTIC STUDIES  Ct Head Wo Contrast Result Date: 08/19/2017 IMPRESSION:  Loss of gray-white differentiation within the insula and frontal operculum compatible with evolving acute infarction. No additional area of infarction identified at this time. No intracranial hemorrhage or mass effect.   Ct Head Code Stroke Wo Contrast Result Date: 08/18/2017 IMPRESSION: 1. No acute stroke, hemorrhage, or mass effect identified. 2. Density in left M2 superior division, possible thrombus. 3. ASPECTS is 10 4. Stable chronic microvascular ischemic changes and parenchymal volume loss of the brain.    MRI BRAIN 08/19/2017 1. Patchy multifocal acute ischemic infarcts involving the bilateral cerebral hemispheres, with additional 1 cm left cerebellar infarct as above. Associated petechial hemorrhage at the left frontal operculum without hemorrhagic transformation. 2. Underlying mild chronic microvascular ischemic disease.   CTA Head/Neck and Cerebral Perfusion W Contrast Result Date: 08/18/2017 IMPRESSION: CTA neck: 1. Patent carotid and vertebral arteries of the neck. 2. Dense calcified plaque of bilateral carotid bifurcations with proximal ICA moderate 60-70% stenosis. 3. Moderate stenosis of right vertebral artery origin. CTA head: 1. Distal left M1 occlusion extending into left M2 superior division. Early reconstitution of left M2 inferior division via collateralization. Poor downstream collateralization and left superior MCA distribution. 2. Left paraclinoid ICA severe stenosis. 3. Right paraclinoid and  terminal ICA occlusion or near occlusion. Contrast opacification of the right upper cervical ICA diminishes gradually to the terminal segment, severe stenosis was present on the prior study. Findings may represent chronic severe stenosis with poor antegrade flow or interval thrombosis. CT brain perfusion: 1. Core infarct volume of 71 cc. 2. Mismatch ischemic penumbra of 102 cc. 3. Infarct location left superior MCA distribution.   Portable Chest Xray Result Date: 08/24/2017 IMPRESSION: Worsening bilateral airspace disease which could reflect edema or Infection  Portable Chest Xray Result Date: 08/22/2017 IMPRESSION: Stable hazy bilateral airspace disease  Portable Chest Xray Result Date: 08/19/2017 IMPRESSION: Endotracheal tube 4.8 cm from carina. Aortic atherosclerosis. No acute pulmonary process identified.    Echocardiogram:                                               Study Conclusions - Left ventricle: Posterior basal hypokinesis. Wall thickness was increased in a pattern of mild LVH. The estimated ejection fraction was 55%. The study is not technically sufficient to allow evaluation of LV diastolic function. - Mitral valve: Moderately calcified annulus. Moderately thickened, moderately calcified leaflets . There was mild regurgitation. - Left atrium: The atrium was mildly dilated. - Atrial septum: No defect or patent foramen ovale was identified.   LE venous doppler - negative for DVT       HISTORY OF PRESENT ILLNESS Thomas Hanna is a 84 y.o. male was a past medical history of coronary artery disease, orthostatic hypotension, prostate cancer, left thalamic stroke with mild residual right hemiparesis since January 2018, who presented to the emergency room as an acute code stroke for sudden onset of right-sided weakness, left-sided gaze preference and a aphasia. But he lives with family.  He was last known normal at 1900 hrs. on 08/18/2017 as witnessed by his son.  Upon checking on him later on that day, he was noted to be completely mute and nonverbal.  He was not able to follow commands.  He had a gaze preference to the left and could not move his right side of the body.  EMS was called.  They assessed him on side and brought him  in as an acute code stroke.  Upon initial evaluation performed by me, he was an NIH stroke scale of greater than 20. His symptoms are consistent with a complete left MCA syndrome.  Noncontrast CT of the head was negative for any acute bleed.  He was given IV TPA.  CT angiogram of the head and neck and CT perfusion study were performed which were favorable for intervention for the left distal M1/proximal M2 occlusion.  Neuro endovascular consultation was obtained and the endovascular team was called and he was taken in for thrombectomy.  He is on no blood thinners.  He has had no recent surgeries.  He has no recent strokes.  Son was called over the phone and all the history was obtained from him as much as possible during the acute situation over the phone.  Son and another family member was then met at bedside prior to getting patient in for the endovascular procedure.  They were appraised of the situation and all questions were answered.  Of note, patient was hypertensive with systolic blood pressure in the 233A and diastolics over 076 on arrival.  Mild delays in administering IV TPA due to blood pressure being out of parameters and use of IV labetalol to control blood pressures.  LKW: 1900 hrs. on 08/18/2017 tpa given?:  Yes Premorbid modified Rankin scale (mRS): 1  HOSPITAL COURSE  The patient was extubated the first night. He had AFIB with RVR and an episode of hypoxia overnight and was transferred back to ICU for Cardizem drip. Awake, mostly non-verbal. Following some very basic commands. Moving LUE spontaneously and occasionally LLE. No spontaneous movement of Right side. +withdrawal to pain. Family at bedside, hopeful he will  start to improve.  PLAN  Continue ASA, for now Frequent neuro checks Telemetry monitoring PT/OT/SLP Consult PM & Rehab Consult Case Management /MSW Ongoing aggressive stroke risk factor management  HX OF STROKES: Left thalamic stroke with mild residual right hemiparesis since January 2018  INTRACRANIAL Atherosclerosis &Stenosis: ASA started for now  DYSPHAGIA: Failed swallow, had cortrak placed. Aspiration Precautions in progress Robinul in progress  MEDICAL ISSUES: CCM following, Appreciate Assistance  Inability to protect airway in the postoperative setting Some Hypoxia overnight, now on face mask SOB Family in agreement that they will not re-intubate at this time  Aspiration Pneumonia Unasyn and Vanco in progress Will stop antibiotics after 7 day course, O2 increased, respiratory following, Proventil & Duonebs & Pulmonary Toilet in progress  Acute Diastolic CHF Cardiology following IV Lasix started yesterday, +12.5L +Diuresing, Foley in place   AFIB with RVR, NEW ONSET:  A. Fib with RVR overnight, Metoprolol IV given Cardiology to have patient transferred back to ICU for Cardizem drip Cardiology Consultation, Appreciate assistance Continue to HOLD [facility] - for now Will need long-term anticoagulation once safe from a stroke standpoint, if appropriate  Leukocytosis Remains afebrile Will obtain chest CXR and U/A Will continue to monitor closely Repeat labs in AM  Hypokalemia IV Replacement in progress Repeat labs in AM  Hypocalcemia Will monitor trend for now Repeat labs in AM  Mild anemia, Likely chronic at baseline Hgb 12.0 / Hct 37.1 - trending up Repeat labs in AM  HYPERTENSION: Stable overnight PRN Labetalol Change blood pressure goal below 110 -180.  Norepinephrine drip Discontinued Long term BP goal normotensive. Home Meds: NONE  HYPERLIPIDEMIA: Home Meds:  Lovaza LDL 87 goal < 70 Will continued Lovaza and start Lipitor to  10 mg daily, if passes SLP evaluation Continue statin  at discharge, if appropriate  R/O DIABETES: HgbA1c 5.7 goal < 7.0  Other Active Problems: Principal Problem:   Acute ischemic left MCA stroke (HCC) Active Problems:   Coronary artery disease due to lipid rich plaque   Normocytic anemia   Coronary artery disease involving coronary bypass graft of native heart without angina pectoris   Pressure injury of skin   Atrial fibrillation with RVR (HCC)   Bilateral carotid artery stenosis   Aspiration pneumonia (HCC)   Ileus (HCC)   Endotracheally intubated   Hypokalemia   Hypertension   Palliative care by specialist   Goals of care, counseling/discussion   Paroxysmal atrial fibrillation ([place])   At high risk for aspiration   Shortness of breath   Right hemiplegia ([place])   Aphasia   Increased oropharyngeal secretions   Terminal care    Hospital day # 11 VTE prophylaxis: SCD's  Diet : No diet orders on file , will need to address NGT with family BM: Last documented 08/24/2017, Bowel meds in progress   Due to the patient's poor prognosis and little chance of any meaningful recovery Palliative care was consulted on 08/24/2017. Following discussions with the family a decision was eventually made for comfort care only. The patient was transferred to 6 N. Arrangements are being made to transfer to an outside hospice facility.   DISCHARGE EXAM Blood pressure (!) 155/73, pulse (!) 127, temperature 98.8 F (37.1 C), temperature source Axillary, resp. rate (!) 24, height 5' 11"  (1.803 m), weight 182 lb 12.2 oz (82.9 kg), SpO2 99 %.  General - Well nourished, well developed, in no apparent distress, O2 mask HEENT-  Normocephalic,   Cardiovascular - Iregular rate and rhythm  Respiratory -  No wheezing. Abdomen - soft and non-tender, Extremities- 1+ edema NEURO:  Mental Status: Awake, mostly non-verbal but interactive with examiner, holding hand and making hand gestures. Following  only very simple commands, Right side neglect Cranial Nerves: PERRL gaze preference to the left, slight hypertropia of both eyes unable to cross the midline.  No blink to threat from right site, right lower facial weakness. Motor: 0/5 right upper extremity, minimal withdrawal to noxious stimulus in the right lower extremity, 4/5 left upper extremity, 3+ LLE. Tone: is decreased and bulk is normal DTR 1+ and no babinski Sensation, coordination and gait not tested.   Discharge Diet   No diet orders on file liquids  DISCHARGE PLAN  Disposition:  [facility] [place]  No antithrombotic for secondary stroke prevention.   40 minutes were spent preparing discharge.  Mikey Bussing PA-C [facility] Pager [phone] 08/30/2017, 11:31 AM I have personally examined this patient, reviewed notes, independently viewed imaging studies, participated in medical decision making and plan of care.ROS completed by me personally and pertinent positives fully documented  I have made any additions or clarifications directly to the above note. Agree with note above.    Antony Contras, MD Medical Director [facility] Pager: [phone] 08/30/2017 2:31 PM

## 2017-08-30 NOTE — Progress Notes (Signed)
Report given to RN at [facility]

## 2017-08-30 NOTE — Progress Notes (Signed)
Daily Progress Note   Patient Name: Thomas Hanna       Date: 08/30/2017 DOB: [birth year]  Age: 84 y.o. MRN#: [id number] Attending Physician: Rosalin Hawking, MD Primary Care Physician: System, Pcp Not In Admit Date: 08/18/2017  Reason for Consultation/Follow-up: Inpatient hospice referral, Non pain symptom management, Pain control, Psychosocial/spiritual support and Terminal Care  Subjective: Patient seen, chart reviewed.  Son, Thomas Hanna, is at the bedside.  States his father had a better night since starting morphine continuous infusion.  No boluses noted since initiation of infusion.  Secretions have improved with scheduled Robinul as well as scopolamine patch. Plan is for patient to be transferred to hospice [facility] this afternoon.  Spoke to [facility] liaison this morning to confirm  Length of Stay: 12  Current Medications: Scheduled Meds:  . furosemide  40 mg Intravenous Q12H  . glycopyrrolate  0.2 mg Intravenous Q4H  . metoprolol tartrate  5 mg Intravenous QID  . scopolamine  1 patch Transdermal Q72H    Continuous Infusions: . morphine 1 mg/hr (08/29/17 1632)    PRN Meds: acetaminophen **OR** acetaminophen (TYLENOL) oral liquid 160 mg/5 mL **OR** acetaminophen, bisacodyl, glycopyrrolate, labetalol, LORazepam, morphine, ondansetron  Physical Exam  Constitutional: He appears well-developed and well-nourished.  Acutely ill elderly man; opens his eyes to voice but otherwise is not responsive  HENT:  Head: Normocephalic and atraumatic.  Cardiovascular:  Tachycardic, A. fib  Pulmonary/Chest:  Wearing 100% nonrebreather.  Breathing less labored since starting morphine continuous infusion Upper secretions have improved  Abdominal: Soft.    Genitourinary:  Genitourinary Comments: Foley  Neurological:  Minimally responsive.  Will open his eyes to voice and can shake his head yes or no in response to simple questions  Skin: Skin is warm and dry.  Psychiatric:  No overt agitation otherwise unable to test  Nursing note and vitals reviewed.           Vital Signs: BP (!) 155/73 (BP Location: Left Arm)   Pulse (!) 127   Temp 98.8 F (37.1 C) (Axillary)   Resp (!) 24   Ht 5\' 11"  (1.803 m)   Wt 82.9 kg (182 lb 12.2 oz)   SpO2 99%   BMI 25.49 kg/m  SpO2: SpO2: 99 % O2 Device: O2 Device: NRB O2 Flow Rate:  O2 Flow Rate (L/min): 10 L/min  Intake/output summary:   Intake/Output Summary (Last 24 hours) at 08/30/2017 1034 Last data filed at 08/30/2017 0533 Gross per 24 hour  Intake 14.05 ml  Output 525 ml  Net -510.95 ml   LBM: Last BM Date: (unable to assess ) Baseline Weight: Weight: 82.9 kg (182 lb 12.2 oz) Most recent weight: Weight: 82.9 kg (182 lb 12.2 oz)       Palliative Assessment/Data:    Flowsheet Rows     Most Recent Value  Intake Tab  Referral Department  Critical care  Unit at Time of Referral  ICU  Palliative Care Primary Diagnosis  Neurology  Date Notified  08/24/17  Palliative Care Type  New Palliative care  Reason for referral  Clarify Goals of Care  Date of Admission  08/18/17  Date first seen by Palliative Care  08/24/17  # of days Palliative referral response time  0 Day(s)  # of days IP prior to Palliative referral  6  Clinical Assessment  Palliative Performance Scale Score  30%  Pain Max last 24 hours  Not able to report  Pain Min Last 24 hours  Not able to report  Dyspnea Max Last 24 Hours  Not able to report  Dyspnea Min Last 24 hours  Not able to report  Nausea Max Last 24 Hours  Not able to report  Nausea Min Last 24 Hours  Not able to report  Anxiety Max Last 24 Hours  Not able to report  Anxiety Min Last 24 Hours  Not able to report  Other Max Last 24 Hours  Not able to  report  Psychosocial & Spiritual Assessment  Palliative Care Outcomes  Patient/Family meeting held?  Yes  Who was at the meeting?  son and DIL  Patient/Family wishes: Interventions discontinued/not started   Lurline Idol, [facility]  Palliative Care follow-up planned  Yes, Facility      Patient Active Problem List   Diagnosis Date Noted  . S/P percutaneous endoscopic gastrostomy (PEG) tube placement ([place])   . Increased oropharyngeal secretions   . Terminal care   . At high risk for aspiration   . Shortness of breath   . Right hemiplegia ([place])   . Aphasia   . Goals of care, counseling/discussion   . Paroxysmal atrial fibrillation ([facility])   . Palliative care by specialist   . Hypokalemia 08/22/2017  . Hypertension 08/22/2017  . Aspiration pneumonia ([place]) 08/21/2017  . Ileus ([place]) 08/21/2017  . Endotracheally intubated   . Bilateral carotid artery stenosis 08/20/2017    Class: Chronic  . Pressure injury of skin 08/19/2017  . Atrial fibrillation with RVR ([place])   . Stroke (cerebrum) ([place]) 08/18/2017  . Cervical paraspinal muscle spasm 02/18/2017  . MCI (mild cognitive impairment) 10/22/2016  . Orthostatic hypertension 09/13/2016  . Coronary artery disease involving coronary bypass graft of native heart without angina pectoris 09/13/2016  . Abnormal MRI of head 09/13/2016  . Orthostatic hypotension 09/03/2016  . CAD S/P percutaneous coronary angioplasty 09/03/2016  . Hx of CABG 09/03/2016  . Dyslipidemia 09/03/2016  . Coronary artery disease due to lipid rich plaque 08/29/2016  . Hyponatremia 08/29/2016  . Elevated LFTs 08/29/2016  . Normocytic anemia 08/29/2016  . Syncope 08/28/2016    Palliative Care Assessment & Plan   Patient Profile: 84 y.o.malewith past medical history of left thalamic stroke (January 2018), atrial fib, hypertension, hyperlipidemiaadmitted on1/7/2019with right-sided weakness, left-sided gaze preference, aphasia. Last known well at 1900 on 08/18/2017. He  presented to the emergency department as a code stroke and was given IV TPA on 08/18/2016. Additionally, he underwent thrombo-lysis on 08/19/2017. He has been intubated and is in ICU. Extubated 1/14.  Consult ordered for goals of care. Now goals are primarily focused on comfort with plan to transfer to residential hospice on 08/30/2017   Recommendations/Plan:  Dyspnea: Improving.  Continue with morphine infusion at 1 mg an hour and 2-4 mg every 30 minutes as needed  Secretions: Improving.  Reduce Robinul to 0.2 mg around-the-clock.  Continue with scopolamine patch for now  Pain: Improving on morphine continuous infusion continue unchanged for now.  Monitor and titrate for effect  Goals of Care and Additional Recommendations:  Limitations on Scope of Treatment: Full Comfort Care  Code Status:    Code Status Orders  (From admission, onward)        Start     Ordered   08/20/17 1411  Do not attempt resuscitation (DNR)  Continuous    Question Answer Comment  In the event of cardiac or respiratory ARREST Do not call a "code blue"   In the event of cardiac or respiratory ARREST Do not perform Intubation, CPR, defibrillation or ACLS   In the event of cardiac or respiratory ARREST Use medication by any route, position, wound care, and other measures to relive pain and suffering. May use oxygen, suction and manual treatment of airway obstruction as needed for comfort.      08/20/17 1410    Code Status History    Date Active Date Inactive Code Status Order ID Comments User Context   08/19/2017 02:18 08/20/2017 14:10 Full Code [id number]  Luanne Bras, MD Inpatient   08/18/2017 22:37 08/19/2017 02:18 Full Code [id number]  Amie [place], MD ED   09/03/2016 16:05 09/09/2016 19:20 Full Code [id number]  Erlene Quan, PA-C ED   08/29/2016 00:39 08/29/2016 16:48 Full Code [id number]  Edwin Dada, MD Inpatient       Prognosis:   Hours - Days in the setting of new CVA with associated  respiratory failure, dysphasia; high risk for an acute event, aspiration  Discharge Planning:  Hospice facility  Care plan was discussed with Dr. Erlinda Hong  Thank you for allowing the Palliative Medicine Team to assist in the care of this patient.   Time In: 0800 Time Out: 0835 Total Time 35 min Prolonged Time Billed  no       Greater than 50%  of this time was spent counseling and coordinating care related to the above assessment and plan.  Dory Horn, NP  Please contact Palliative Medicine Team phone at [phone] for questions and concerns.

## 2017-08-30 NOTE — Progress Notes (Signed)
200 mg og Morpghine from a bag wasted as witnessed by Vennie Homans , RN.

## 2017-08-30 NOTE — Care Management Note (Signed)
Case Management Note  Patient Details  Name: Thomas Hanna MRN: [id number] Date of Birth: 04-20-1924  Subjective/Objective:                 DC to residential hospice as facilitated by CSW   Action/Plan:   Expected Discharge Date:  08/30/17               Expected Discharge Plan:  [place]  In-House Referral:  Clinical Social Work  Discharge planning Services  CM Consult  Post Acute Care Choice:    Choice offered to:     DME Arranged:    DME Agency:     HH Arranged:    [place] Agency:     Status of Service:  Completed, signed off  If discussed at [employer], dates discussed:    Additional Comments:  Carles Collet, RN 08/30/2017, 1:29 PM

## 2017-09-12 DEATH — deceased

## 2017-10-10 ENCOUNTER — Ambulatory Visit: Payer: BLUE CROSS/BLUE SHIELD | Admitting: Cardiovascular Disease

## 2017-12-14 IMAGING — DX DG CERVICAL SPINE COMPLETE 4+V
6 series · 6 of 6 positions shown · non-contrast
Comparison: None.

CLINICAL DATA: Bilateral neck and upper anterior chest pain x 1
month. Not injury.

EXAM:
CERVICAL SPINE - COMPLETE 4+ VIEW

[c-spine lat]
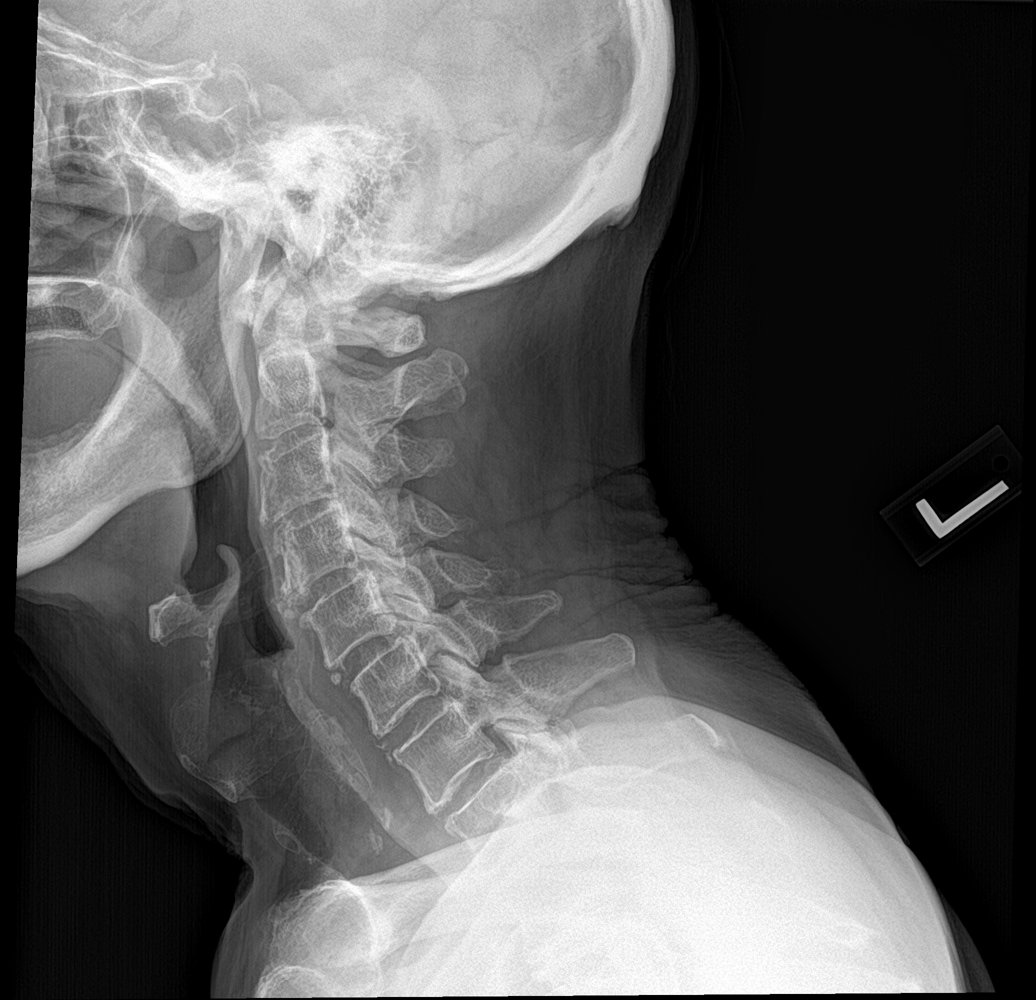

[c-spine obl (1 of 2)]
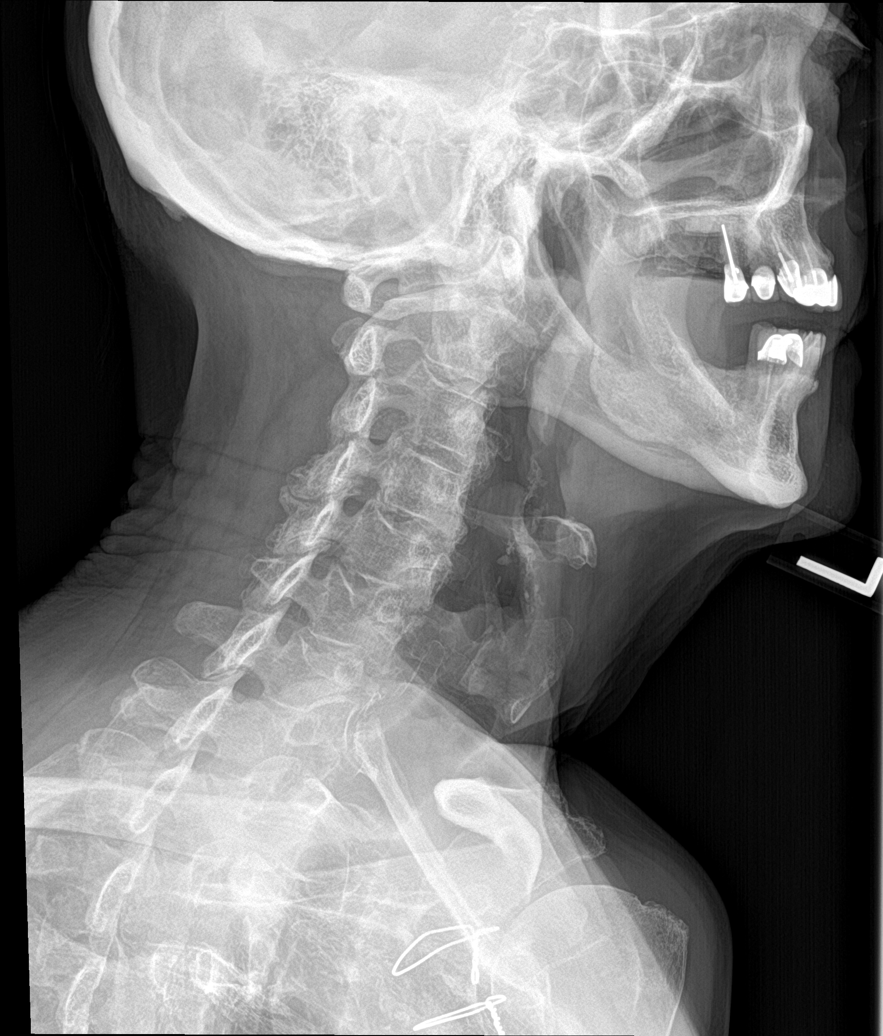

[c-spine obl (2 of 2)]
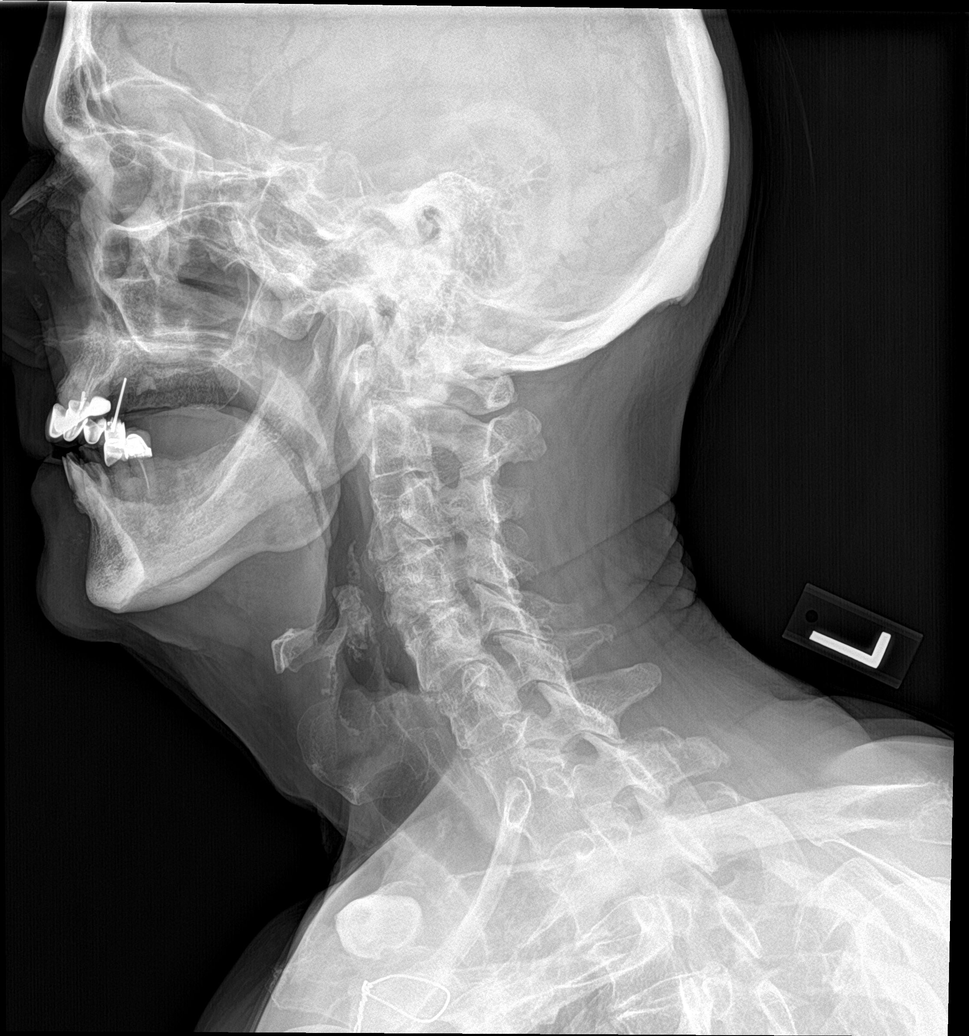

[c-spine ap]
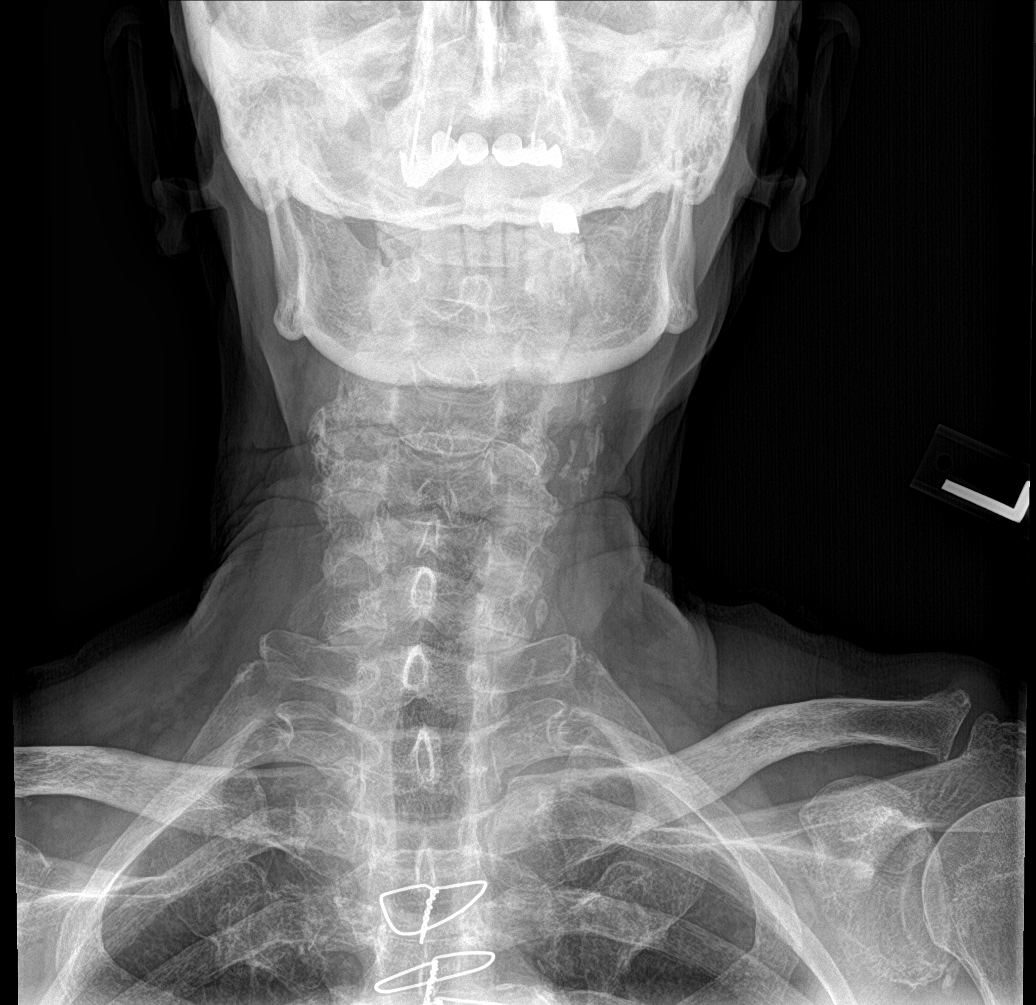

[c-spine open mouth]
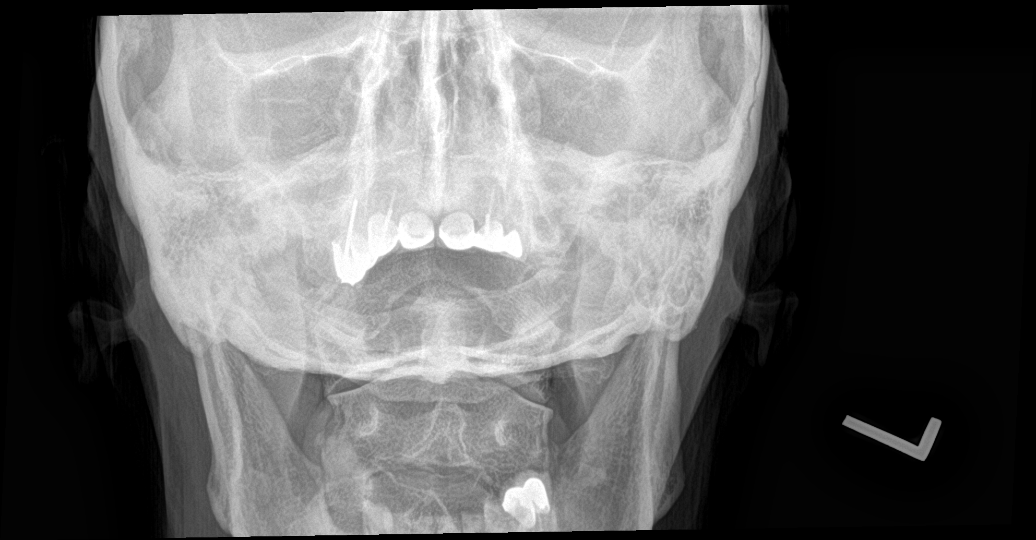

[[person_name]]
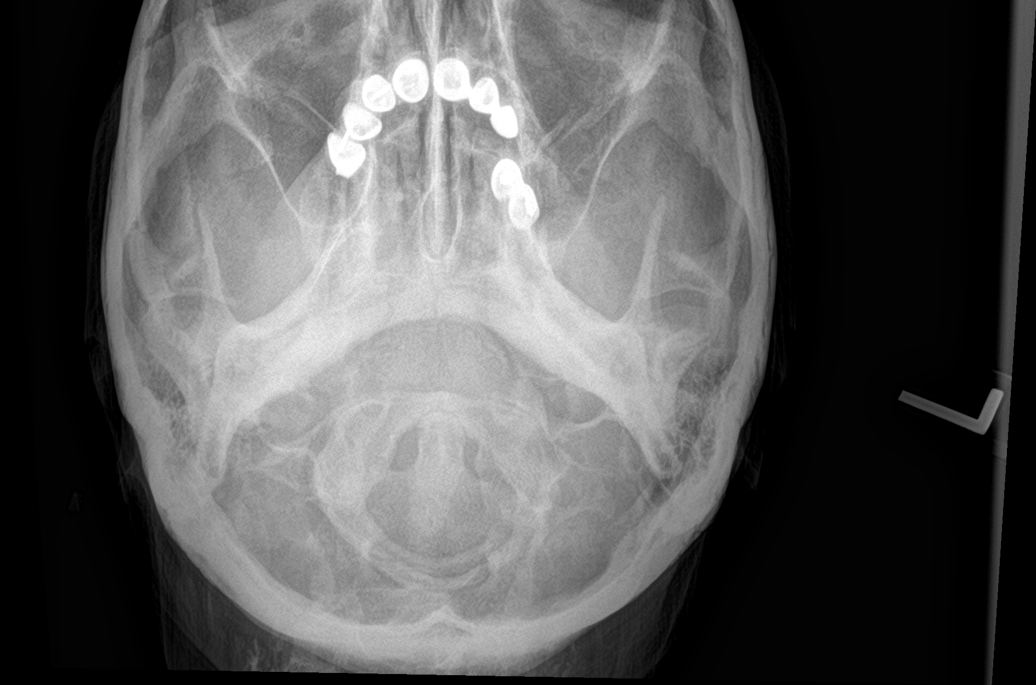

[6 of 6 positions shown; findings below may reference images not displayed]

FINDINGS: No fracture, spondylolisthesis or bone lesion.

Mild loss of disc height at C3-C4 and C4-C5. There are small
endplate osteophytes at C3, C4 and C5.

Facet degenerative changes noted bilaterally, most prominently on
the right at C4-C5. Facet spurring leads to mild neural foraminal
narrowing on the right at C4-C5. There is mild neural foraminal
narrowing on the left at C3-C4 and C4-C5 from uncovertebral
spurring.

Bones are demineralized.

Soft tissues are unremarkable other than left sided carotid vascular
calcifications.
IMPRESSION: 1. No fracture, spondylolisthesis, bone lesion or acute finding.
2. Degenerative changes as described.
